# Patient Record
Sex: Male | Born: 1945 | Race: White | Hispanic: No | Smoking: Current some day smoker
Health system: Southern US, Community
[De-identification: ages and names within clinical notes are randomized; demographics above are authoritative.]

## PROBLEM LIST (undated history)

## (undated) DIAGNOSIS — H9190 Unspecified hearing loss, unspecified ear: Secondary | ICD-10-CM

## (undated) DIAGNOSIS — Z952 Presence of prosthetic heart valve: Secondary | ICD-10-CM

## (undated) DIAGNOSIS — C801 Malignant (primary) neoplasm, unspecified: Secondary | ICD-10-CM

## (undated) DIAGNOSIS — Z0000 Encounter for general adult medical examination without abnormal findings: Secondary | ICD-10-CM

## (undated) DIAGNOSIS — R3915 Urgency of urination: Secondary | ICD-10-CM

## (undated) DIAGNOSIS — H6120 Impacted cerumen, unspecified ear: Secondary | ICD-10-CM

## (undated) DIAGNOSIS — E785 Hyperlipidemia, unspecified: Secondary | ICD-10-CM

## (undated) DIAGNOSIS — T7840XA Allergy, unspecified, initial encounter: Secondary | ICD-10-CM

## (undated) DIAGNOSIS — R911 Solitary pulmonary nodule: Secondary | ICD-10-CM

## (undated) DIAGNOSIS — I350 Nonrheumatic aortic (valve) stenosis: Secondary | ICD-10-CM

## (undated) DIAGNOSIS — R159 Full incontinence of feces: Secondary | ICD-10-CM

## (undated) DIAGNOSIS — C439 Malignant melanoma of skin, unspecified: Secondary | ICD-10-CM

## (undated) DIAGNOSIS — M1990 Unspecified osteoarthritis, unspecified site: Secondary | ICD-10-CM

## (undated) DIAGNOSIS — H5461 Unqualified visual loss, right eye, normal vision left eye: Secondary | ICD-10-CM

## (undated) DIAGNOSIS — I10 Essential (primary) hypertension: Secondary | ICD-10-CM

## (undated) DIAGNOSIS — K599 Functional intestinal disorder, unspecified: Secondary | ICD-10-CM

## (undated) DIAGNOSIS — R5383 Other fatigue: Secondary | ICD-10-CM

## (undated) DIAGNOSIS — R011 Cardiac murmur, unspecified: Secondary | ICD-10-CM

## (undated) HISTORY — DX: Hyperlipidemia, unspecified: E78.5

## (undated) HISTORY — DX: Malignant melanoma of skin, unspecified: C43.9

## (undated) HISTORY — DX: Unqualified visual loss, right eye, normal vision left eye: H54.61

## (undated) HISTORY — PX: TONSILLECTOMY: SUR1361

## (undated) HISTORY — DX: Malignant (primary) neoplasm, unspecified: C80.1

## (undated) HISTORY — DX: Full incontinence of feces: R15.9

## (undated) HISTORY — DX: Essential (primary) hypertension: I10

## (undated) HISTORY — DX: Nonrheumatic aortic (valve) stenosis: I35.0

## (undated) HISTORY — DX: Urgency of urination: R39.15

## (undated) HISTORY — PX: POLYPECTOMY: SHX149

## (undated) HISTORY — DX: Encounter for general adult medical examination without abnormal findings: Z00.00

## (undated) HISTORY — PX: HERNIA REPAIR: SHX51

## (undated) HISTORY — DX: Impacted cerumen, unspecified ear: H61.20

## (undated) HISTORY — DX: Functional intestinal disorder, unspecified: K59.9

## (undated) HISTORY — DX: Other fatigue: R53.83

## (undated) HISTORY — DX: Allergy, unspecified, initial encounter: T78.40XA

## (undated) HISTORY — DX: Unspecified hearing loss, unspecified ear: H91.90

---

## 1979-11-18 HISTORY — PX: EYE SURGERY: SHX253

## 1999-08-15 ENCOUNTER — Ambulatory Visit (HOSPITAL_BASED_OUTPATIENT_CLINIC_OR_DEPARTMENT_OTHER): Admission: RE | Admit: 1999-08-15 | Discharge: 1999-08-15 | Payer: Self-pay | Admitting: General Surgery

## 2002-05-16 ENCOUNTER — Encounter: Payer: Self-pay | Admitting: General Surgery

## 2002-05-16 ENCOUNTER — Encounter: Admission: RE | Admit: 2002-05-16 | Discharge: 2002-05-16 | Payer: Self-pay | Admitting: General Surgery

## 2002-05-17 ENCOUNTER — Ambulatory Visit (HOSPITAL_BASED_OUTPATIENT_CLINIC_OR_DEPARTMENT_OTHER): Admission: RE | Admit: 2002-05-17 | Discharge: 2002-05-17 | Payer: Self-pay | Admitting: General Surgery

## 2011-10-29 ENCOUNTER — Ambulatory Visit (INDEPENDENT_AMBULATORY_CARE_PROVIDER_SITE_OTHER): Payer: Medicare Other | Admitting: Sports Medicine

## 2011-10-29 VITALS — BP 148/90 | Ht 69.0 in | Wt 170.0 lb

## 2011-10-29 DIAGNOSIS — M25569 Pain in unspecified knee: Secondary | ICD-10-CM

## 2011-10-29 DIAGNOSIS — S83289A Other tear of lateral meniscus, current injury, unspecified knee, initial encounter: Secondary | ICD-10-CM | POA: Insufficient documentation

## 2011-10-29 DIAGNOSIS — M25562 Pain in left knee: Secondary | ICD-10-CM | POA: Insufficient documentation

## 2011-10-29 MED ORDER — TRAMADOL HCL 50 MG PO TABS: 50.0000 mg | ORAL_TABLET | Freq: Four times a day (QID) | ORAL | Status: AC | PRN

## 2011-10-29 NOTE — Assessment & Plan Note (Signed)
We will try tramadol and icing for pain. He gets relief with Aleve but he also gets stomach irritation.

## 2011-10-29 NOTE — Assessment & Plan Note (Signed)
Compression sleeve to try to lessen the swelling Basic rehabilitation exercises prescribed Limit knee bending and activity until we recheck this in 6 weeks  We can consider injection if this worsens

## 2011-10-29 NOTE — Progress Notes (Signed)
  Subjective:    Patient ID: Charles Robles, male    DOB: August 16, 1946, 64 y.o.   MRN: [id number]  HPI  Pt presents to clinic for evaluation of left knee - posterior along biceps tendon. Has had the pain for over 2 months.  Pain with full flexion, going up and down stairs. He does not recall an injury, does not have swelling, locking, or giving out. Walks for exercises 4-5 times per week. 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   Also has low back pain that has been bothering him more recently. Driving causes pain. Hx of 3 falls on back in the last yr        Review of Systems     Objective:   Physical Exam  Lt knee exam: Flexion from 90 to full extension Lateral joint line is non tender to palpation Lachman's neg Mcmurray's painful with ER and has slight clicking along the lateral joint line Slight suprapatellar pouch swelling on lt Thessaly's positive on lt    Lt knee gets full extension, starts getting pain at 120 deg of flexion Rt knee gets full extension, and flexion to 145 deg with no pain  MSK ultrasound There is a moderately large suprapatellar pouch effusion extending 7 cm above the patella Quadriceps tendon and patellar tendon are normal Medial meniscus looks intact Lateral meniscus looks intact until I scanned the posterior corner There is a linear split in the posterior meniscus laterally with some hypoechoic change surrounding this The biceps femoris tendon looks normal       Assessment & Plan:

## 2011-10-29 NOTE — Patient Instructions (Addendum)
Please try to avoid deep knee bending activities  Do suggested knee exercises  Try compression sleeve for your knee  Try tramadol twice daily for your pain  Please follow up in 6 weeks for rescan of your knee   Thank you for seeing [place] today!

## 2011-11-07 ENCOUNTER — Telehealth: Payer: Self-pay | Admitting: *Deleted

## 2011-11-07 NOTE — Telephone Encounter (Signed)
Called pt left VM to return my call. 

## 2011-11-07 NOTE — Telephone Encounter (Signed)
Message copied by Mora Bellman on Fri Nov 07, 2011  2:10 PM ------      Message from: Lizbeth Bark      Created: Fri Nov 07, 2011 10:41 AM      Regarding: phone messag      Contact: [phone]       Patient would like you to call him regarding the swelling in left knee.

## 2011-12-10 ENCOUNTER — Ambulatory Visit (INDEPENDENT_AMBULATORY_CARE_PROVIDER_SITE_OTHER): Payer: Medicare Other | Admitting: Sports Medicine

## 2011-12-10 VITALS — BP 144/80

## 2011-12-10 DIAGNOSIS — S83289A Other tear of lateral meniscus, current injury, unspecified knee, initial encounter: Secondary | ICD-10-CM | POA: Diagnosis not present

## 2011-12-10 DIAGNOSIS — M25569 Pain in unspecified knee: Secondary | ICD-10-CM | POA: Diagnosis not present

## 2011-12-10 MED ORDER — MELOXICAM 15 MG PO TABS: ORAL_TABLET | ORAL | Status: DC

## 2011-12-10 NOTE — Progress Notes (Signed)
  Subjective:    Patient ID: Charles Robles, male    DOB: 03-Aug-1946, 66 y.o.   MRN: [id number]  HPI Left degenerative meniscal tear with DJD.  Better with aleve, sleeve, rehab.  No mechanical symptoms.  Swelling improved.  Happy with results, 60% better.   Review of Systems    No fevers, chills, night sweats, weight loss, chest pain, or shortness of breath.  Objective:   Physical Exam  General:  Well developed, well nourished, and in no acute distress. Neuro:  Alert and oriented x3, extra-ocular muscles intact. Skin: Warm and dry, no rashes noted. Respiratory:  Not using accessory muscles, speaking in full sentences.  Left Knee: Normal to inspection with no erythema or obvious bony abnormalities. Minimal effusion. Palpation normal with no warmth, joint line tenderness, patellar tenderness, or condyle tenderness. ROM full in flexion and extension and lower leg rotation. Ligaments with solid consistent endpoints including ACL, PCL, LCL, MCL. Negative Mcmurray's, Apley's, and Thessalonian tests. Non painful patellar compression. Patellar glide without crepitus. Patellar and quadriceps tendons unremarkable. Hamstring and quadriceps strength is normal.      Assessment & Plan:

## 2011-12-10 NOTE — Patient Instructions (Addendum)
Meloxicam. Rehab exercises. Avoid deep knee bending. Come back to see [place] in 2-3 months.

## 2011-12-10 NOTE — Assessment & Plan Note (Signed)
Improved. Minimimal mechanical symptoms. Change to meloxicam. Cont exercises. Cont sleeve. Ice.

## 2011-12-29 ENCOUNTER — Telehealth: Payer: Self-pay | Admitting: *Deleted

## 2011-12-29 NOTE — Telephone Encounter (Signed)
Pt states he has been feeling light headed, drowsy, short of breath and some very slight chest tightness for about 1 week.  States he looked at the side effects of meloxicam- and all can be caused by the medication.    Dr. Darrick Penna spoke with pt- advised him to stop meloxicam because it is causing him to retain fluid, which is a known side effect of the medication.  Recommended that he restart tramadol since he did not have any problems on this medication.  Pt agreeable.

## 2012-02-09 ENCOUNTER — Ambulatory Visit (INDEPENDENT_AMBULATORY_CARE_PROVIDER_SITE_OTHER): Payer: Medicare Other | Admitting: Sports Medicine

## 2012-02-09 VITALS — BP 120/70

## 2012-02-09 DIAGNOSIS — M25569 Pain in unspecified knee: Secondary | ICD-10-CM | POA: Diagnosis not present

## 2012-02-09 DIAGNOSIS — S83289A Other tear of lateral meniscus, current injury, unspecified knee, initial encounter: Secondary | ICD-10-CM | POA: Diagnosis not present

## 2012-02-09 DIAGNOSIS — M25519 Pain in unspecified shoulder: Secondary | ICD-10-CM

## 2012-02-09 MED ORDER — TRAMADOL HCL 50 MG PO TABS: 50.0000 mg | ORAL_TABLET | Freq: Four times a day (QID) | ORAL | Status: AC | PRN

## 2012-02-09 MED ORDER — MELOXICAM 15 MG PO TABS: 15.0000 mg | ORAL_TABLET | Freq: Every day | ORAL | Status: DC

## 2012-02-09 NOTE — Patient Instructions (Addendum)
1. You can experiment with your knee brace.  Use it for comfort but if you don't have too much discomfort you don't have to wear it.  2. You should follow up with your regular doctor to get a work up for the neurologic symptoms that we discussed today.  Take one full aspirin daily until you have discussed it with your regular doctor.  3. Do your shoulder exercises as indicated on the handout.    4. If you continue to have pain in your shoulder make an appointment for an  ultrasound it in about one month.

## 2012-02-09 NOTE — Progress Notes (Signed)
  Subjective:    Patient ID: Charles Robles, male    DOB: [birth year], 66 y.o.   MRN: [id number]  HPI 66 y/o male is here c/o right shoulder pain for 3-4 weeks.  It is not associated with any specific activity.  He does have night pain but it doesn't wake him at night.  The pain started after wood splitting.  The pain is lateral but but does not extend beyond the elbow.  He has no specific previous injuries but he has been splitting wood for a long time.  He is also here to follow up on a left degenerative meniscus.  He has minimal pain and mechanical symptoms.  He he takes 2 full dose aspirin on more days than not.  He also wears a knee brace and wonders if he will still need this.   Review of Systems     Objective:   Physical Exam  ROM is normal except he has decreased internal rotation He also has mild pain in all plains of motion Positive empty can and positive Hawkin's Positive reach over No tenderness to palpation over the [facility] joint or the biceps tendon Rotator cuff strength is preserved  The left knee has no effusion, no joint line tenderness and good ROM  Ultrasound: The subscapularis, infraspinatus, supraspinatus, and biceps tendon were visualized.  There is no sign of a tear in these tendons.      Assessment & Plan:

## 2012-02-11 ENCOUNTER — Ambulatory Visit (INDEPENDENT_AMBULATORY_CARE_PROVIDER_SITE_OTHER): Payer: Medicare Other | Admitting: Family Medicine

## 2012-02-11 ENCOUNTER — Encounter: Payer: Self-pay | Admitting: Family Medicine

## 2012-02-11 VITALS — BP 174/91 | HR 69 | Temp 98.1°F | Resp 20 | Ht 68.5 in | Wt 168.8 lb

## 2012-02-11 DIAGNOSIS — I10 Essential (primary) hypertension: Secondary | ICD-10-CM

## 2012-02-11 DIAGNOSIS — M542 Cervicalgia: Secondary | ICD-10-CM | POA: Insufficient documentation

## 2012-02-11 DIAGNOSIS — R011 Cardiac murmur, unspecified: Secondary | ICD-10-CM | POA: Diagnosis not present

## 2012-02-11 DIAGNOSIS — R42 Dizziness and giddiness: Secondary | ICD-10-CM | POA: Diagnosis not present

## 2012-02-11 LAB — CBC WITH DIFFERENTIAL/PLATELET
Basophils Absolute: 0 10*3/uL (ref 0.0–0.1)
Basophils Relative: 1 % (ref 0–1)
Eosinophils Absolute: 0.1 10*3/uL (ref 0.0–0.7)
Eosinophils Relative: 3 % (ref 0–5)
HCT: 47.5 % (ref 39.0–52.0)
Hemoglobin: 15.9 g/dL (ref 13.0–17.0)
Lymphocytes Relative: 26 % (ref 12–46)
Lymphs Abs: 1.4 10*3/uL (ref 0.7–4.0)
MCH: 31.7 pg (ref 26.0–34.0)
MCHC: 33.5 g/dL (ref 30.0–36.0)
MCV: 94.6 fL (ref 78.0–100.0)
Monocytes Absolute: 0.7 10*3/uL (ref 0.1–1.0)
Monocytes Relative: 14 % — ABNORMAL HIGH (ref 3–12)
Neutro Abs: 3 10*3/uL (ref 1.7–7.7)
Neutrophils Relative %: 57 % (ref 43–77)
Platelets: 189 10*3/uL (ref 150–400)
RBC: 5.02 MIL/uL (ref 4.22–5.81)
RDW: 13.3 % (ref 11.5–15.5)
WBC: 5.3 10*3/uL (ref 4.0–10.5)

## 2012-02-11 LAB — T4, FREE: Free T4: 1.13 ng/dL (ref 0.80–1.80)

## 2012-02-11 LAB — COMPREHENSIVE METABOLIC PANEL
ALT: 19 U/L (ref 0–53)
AST: 18 U/L (ref 0–37)
Albumin: 4.2 g/dL (ref 3.5–5.2)
Alkaline Phosphatase: 60 U/L (ref 39–117)
BUN: 15 mg/dL (ref 6–23)
CO2: 26 mEq/L (ref 19–32)
Calcium: 9.5 mg/dL (ref 8.4–10.5)
Chloride: 106 mEq/L (ref 96–112)
Creat: 0.99 mg/dL (ref 0.50–1.35)
Glucose, Bld: 101 mg/dL — ABNORMAL HIGH (ref 70–99)
Potassium: 4.4 mEq/L (ref 3.5–5.3)
Sodium: 138 mEq/L (ref 135–145)
Total Bilirubin: 0.5 mg/dL (ref 0.3–1.2)
Total Protein: 6.9 g/dL (ref 6.0–8.3)

## 2012-02-11 LAB — LIPID PANEL
Cholesterol: 282 mg/dL — ABNORMAL HIGH (ref 0–200)
HDL: 76 mg/dL (ref >39)
Total CHOL/HDL Ratio: 3.7 Ratio
Triglycerides: 214 mg/dL — ABNORMAL HIGH (ref <150)
VLDL: 43 mg/dL — ABNORMAL HIGH (ref 0–40)

## 2012-02-11 LAB — POCT URINALYSIS DIPSTICK
Bilirubin, UA: NEGATIVE
Blood, UA: NEGATIVE
Glucose, UA: NEGATIVE
Ketones, UA: NEGATIVE
Leukocytes, UA: NEGATIVE
Nitrite, UA: NEGATIVE
Protein, UA: NEGATIVE
Spec Grav, UA: 1.025
Urobilinogen, UA: 0.2
pH, UA: 5

## 2012-02-11 LAB — TSH: TSH: 1.019 u[IU]/mL (ref 0.350–4.500)

## 2012-02-11 NOTE — Patient Instructions (Signed)
You will be scheduled for ECHO of your heart and a CT scan of your brain to look for evidence of a stroke or TIA.

## 2012-02-11 NOTE — Assessment & Plan Note (Signed)
Because his symptoms are mild we will start with strengthening exercises.  He will try these at home for a month and follow up with [place] if he continues to have pain.  He is currently taking aspirin daily for cardioprotection so no medication will need to be added.

## 2012-02-11 NOTE — Progress Notes (Signed)
Subjective:    Patient ID: Charles Robles, male    DOB: 06-03-1946, 66 y.o.   MRN: [id number]  HPI  This 66 y.o Cauc male is here today for evaluation of an episode of dizziness with generalized  headache, nausea and vomiting last Thursday (02/05/12). He had 2 emesis episodes with weakness  that lasted for ~ 48 hours; by Sunday (02/08/12) he felt much better; he went out on Saturday to purchase a vehicle.  The only residual symptom is some dizziness without weakness or headache. He has HTN which was treated in   the past with Lisinopril- HCTZ but he discontinued the med several months ago;he says BP checks have been  near normal range. He denies any CP, syncope, palpitations, numbness or abnormal gait or problems with  coordination. He was seen by Dr. Roanna Epley at [facility] on 02/09/12 and advised to have these symptoms evaluated.        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 He could not give me any details  about the cause of the murmur.    Review of Systems  Eyes: Negative.   Respiratory: Negative.   Cardiovascular: Negative.   Gastrointestinal: Positive for nausea and vomiting. Negative for abdominal pain, diarrhea and constipation.  Genitourinary: Negative.   Neurological: Positive for dizziness, weakness and headaches. Negative for seizures, facial asymmetry, speech difficulty and numbness.  Psychiatric/Behavioral: Negative.   All other systems reviewed and are negative.       Objective:   Physical Exam  Vitals reviewed. Constitutional: He is oriented to person, place, and time. He appears well-developed and well-nourished.  HENT:  Head: Normocephalic and atraumatic.  Right Ear: External ear normal.  Left Ear: External ear normal.  Nose: Nose normal.  Mouth/Throat: Oropharynx is clear and moist.  Eyes:  Conjunctivae and EOM are normal. Pupils are equal, round, and reactive to light. No scleral icterus.       Bilateral cataracts- fundi difficult to assess  Neck: Normal range of motion. Neck supple. No thyromegaly present.  Cardiovascular: Normal rate, regular rhythm, S1 normal and S2 normal.  Exam reveals no gallop.   Murmur heard.  Crescendo systolic murmur is present with a grade of 4/6       Loud blowing murmur heard best at LLSB  Pulmonary/Chest: Effort normal and breath sounds normal. No respiratory distress. He has no rales.  Abdominal: Bowel sounds are normal. He exhibits no distension and no mass. There is no tenderness. There is no guarding.  Lymphadenopathy:    He has no cervical adenopathy.  Neurological: He is alert and oriented to person, place, and time. No cranial nerve deficit. Coordination normal.  Skin: Skin is warm and dry.  Psychiatric: He has a normal mood and affect. His behavior is normal. Judgment and thought content normal.    ECG:  SInus rhythm with nonspecific ST junctional depression (less than 1 mm)- no significant difference from            ECG done 06/09/11)      Assessment & Plan:   1. Dizziness   CBC with Differential, CT Head Wo Contrast (pt seems hesitant to have imaging study b/o cost; he is advised to inquire about out-of-pocket cost at the time he is contacted with appointment for CT)  2. HTN (hypertension)  EKG 12-Lead, Comprehensive metabolic panel, T4, Free, TSH, Lipid panel; pt wants to wait on results of test before resuming BP medication  3. Undiagnosed cardiac murmurs  2D Echocardiogram without contrast

## 2012-02-11 NOTE — Assessment & Plan Note (Signed)
His pain has improved to the point that is no longer interferes with his life.  He can continue to use his brace PRN but probably doesn't need to wear it all of the time.  Follow up for his knee is PRN

## 2012-02-12 ENCOUNTER — Encounter: Payer: Self-pay | Admitting: Family Medicine

## 2012-02-12 DIAGNOSIS — I10 Essential (primary) hypertension: Secondary | ICD-10-CM | POA: Insufficient documentation

## 2012-02-16 ENCOUNTER — Ambulatory Visit
Admission: RE | Admit: 2012-02-16 | Discharge: 2012-02-16 | Disposition: A | Discharge status: Home / Self Care | Payer: Medicare Other | Source: Ambulatory Visit | Attending: Family Medicine | Admitting: Family Medicine

## 2012-02-16 DIAGNOSIS — R51 Headache: Secondary | ICD-10-CM | POA: Diagnosis not present

## 2012-02-16 DIAGNOSIS — R42 Dizziness and giddiness: Secondary | ICD-10-CM

## 2012-02-16 DIAGNOSIS — R112 Nausea with vomiting, unspecified: Secondary | ICD-10-CM | POA: Diagnosis not present

## 2012-02-18 NOTE — Progress Notes (Signed)
Quick Note:  Please call pt and advise that the following labs are abnormal...  Lipid profile shows total cholesterol and triglycerides are high. Diet changes are very important and can be effective. I would like for pt to try a low fat, low cholesterol diet; information is readily available on the Internet. I would like to recheck his numbers again in about 3 months.  Provide pt with copy of labs. ______

## 2012-02-18 NOTE — Progress Notes (Signed)
Quick Note:  Your test is abnormal. You need to schedule f/u office visit to discuss.  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 I would like to see the pt again within the next 4-6 weeks. Please advise him to schedule a follow-up appt. ______

## 2012-02-19 ENCOUNTER — Telehealth: Payer: Self-pay

## 2012-02-19 NOTE — Telephone Encounter (Signed)
lmom to cb. 

## 2012-02-19 NOTE — Telephone Encounter (Signed)
LMOM to CB. 

## 2012-02-19 NOTE — Telephone Encounter (Signed)
PT STATES HE HAD LABS DONE AND IS STILL WAITING TO HEAR RESULTS PLEASE CALL [phone]   HE WOULD LIKE TO Marvis Moeller

## 2012-02-19 NOTE — Telephone Encounter (Signed)
DR [facility]   PT STATES HE IS STILL HAVING HEADACHES AND REQUESTS A CALL BACK FROM YOU TO SPEAK WITH YOU ABOUT THIS.   HE HAS SCHEDULED A F/U APPT FOR MAY 2013   [facility]

## 2012-02-20 NOTE — Telephone Encounter (Signed)
Spoke with pt about his HA Sxs. Pt stated he conts to have slight/mild HAs and dizziness that he thinks are becoming even milder. He thinks it may be r/t his vision, cataracts and not wearing his glasses as often as he should. He plans to get into see his eye doctor soon. He states he doesn't really have any other ?s about this now, but will CB or RTC if worsens again. D/W pt his CT Head results and white matter/cholesterol relationship and how high chol may affect white matter and pt ?'d whether getting his chol under control might prevent or even improve the progression of white matter. Pt wishes to discuss this at his appt w/Dr Audria Nine in May. Also D/W pt ways to improve chol. Dr Audria Nine, I'm forwarding this to you FYI and in case you think pt needs to f/up on HAs/dizziness other than w/ his eye doctor.

## 2012-02-20 NOTE — Telephone Encounter (Signed)
LMOM to CB. Get details of Sxs/?s for Dr Audria Nine

## 2012-02-24 ENCOUNTER — Other Ambulatory Visit: Payer: Self-pay | Admitting: *Deleted

## 2012-02-24 ENCOUNTER — Ambulatory Visit (HOSPITAL_COMMUNITY): Payer: Medicare Other | Attending: Cardiovascular Disease

## 2012-02-24 ENCOUNTER — Other Ambulatory Visit: Payer: Self-pay

## 2012-02-24 DIAGNOSIS — R55 Syncope and collapse: Secondary | ICD-10-CM | POA: Insufficient documentation

## 2012-02-24 DIAGNOSIS — I10 Essential (primary) hypertension: Secondary | ICD-10-CM | POA: Insufficient documentation

## 2012-02-24 DIAGNOSIS — R011 Cardiac murmur, unspecified: Secondary | ICD-10-CM | POA: Diagnosis not present

## 2012-02-24 MED ORDER — TRAMADOL HCL 50 MG PO TABS: 50.0000 mg | ORAL_TABLET | Freq: Three times a day (TID) | ORAL | Status: DC | PRN

## 2012-02-24 NOTE — Telephone Encounter (Signed)
I agree with advise given to pt and will discuss lipids, etc. at visit in May. A complete evaluation by his eye care specialist is appropriate at this time.

## 2012-03-18 ENCOUNTER — Encounter: Payer: Self-pay | Admitting: Family Medicine

## 2012-03-18 ENCOUNTER — Ambulatory Visit (INDEPENDENT_AMBULATORY_CARE_PROVIDER_SITE_OTHER): Payer: Medicare Other | Admitting: Family Medicine

## 2012-03-18 VITALS — BP 179/89 | HR 81 | Temp 98.9°F | Resp 18 | Ht 68.0 in | Wt 173.4 lb

## 2012-03-18 DIAGNOSIS — I10 Essential (primary) hypertension: Secondary | ICD-10-CM

## 2012-03-18 DIAGNOSIS — M25559 Pain in unspecified hip: Secondary | ICD-10-CM

## 2012-03-18 DIAGNOSIS — E785 Hyperlipidemia, unspecified: Secondary | ICD-10-CM | POA: Diagnosis not present

## 2012-03-18 DIAGNOSIS — M25551 Pain in right hip: Secondary | ICD-10-CM

## 2012-03-18 NOTE — Patient Instructions (Addendum)

## 2012-03-18 NOTE — Progress Notes (Signed)
  Subjective:    Patient ID: Charles Robles, male    DOB: 11/16/1946, 66 y.o.   MRN: [id number]  HPI This 66 y.o. Cauc male returns for follow-up of HTN and lipid disorder. He has opted to work on lifestyle changes  as opposed to prescription medications. His complaint today is right hip pain (he suspects DJD); takes an  occasional Aleve. Not particularly bothered with nocturnal pain. He gives a hx of several falls in the past  which have contributed to joint pain.        Re: HTN- pt plans to start heart-healthy diet (starting with oatmeal daily); to change activity level and continue  Fish Oil supplement. This is also part of the plan to reduce Lipids. He does not want to take any prescription  medications. He is taking an aspirin daily.   Review of Systems As per HPI     Objective:   Physical Exam  Nursing note and vitals reviewed. Constitutional: He is oriented to person, place, and time. He appears well-developed and well-nourished. No distress.  HENT:  Head: Normocephalic and atraumatic.  Eyes: No scleral icterus.  Cardiovascular: Normal rate.   Pulmonary/Chest: No respiratory distress.  Musculoskeletal:       Gait mildly antalgic  Neurological: He is alert and oriented to person, place, and time. No cranial nerve deficit.  Psychiatric: He has a normal mood and affect. His behavior is normal. Thought content normal.    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 HTN (hypertension)    Lifestyle changes to be aggressively undertaken; pt given info about Anti-Inflammatory diet and other changes to help lower BP  2. Hyperlipidemia               Pt states he was not fasting at time  of previous labs- recheck in 3 months  3. Right hip pain                Pt needs no further evaluation at this time; if pain increases in severity, will pursue further work-up. Will use conservative measures to reduce pain

## 2012-03-23 DIAGNOSIS — H18609 Keratoconus, unspecified, unspecified eye: Secondary | ICD-10-CM | POA: Diagnosis not present

## 2012-03-23 DIAGNOSIS — H52209 Unspecified astigmatism, unspecified eye: Secondary | ICD-10-CM | POA: Diagnosis not present

## 2012-03-23 DIAGNOSIS — H04129 Dry eye syndrome of unspecified lacrimal gland: Secondary | ICD-10-CM | POA: Diagnosis not present

## 2012-04-02 DIAGNOSIS — H04129 Dry eye syndrome of unspecified lacrimal gland: Secondary | ICD-10-CM | POA: Diagnosis not present

## 2012-06-07 DIAGNOSIS — H11829 Conjunctivochalasis, unspecified eye: Secondary | ICD-10-CM | POA: Diagnosis not present

## 2013-02-03 ENCOUNTER — Encounter: Payer: Self-pay | Admitting: Sports Medicine

## 2013-02-03 ENCOUNTER — Ambulatory Visit (INDEPENDENT_AMBULATORY_CARE_PROVIDER_SITE_OTHER): Payer: Medicare Other | Admitting: Sports Medicine

## 2013-02-03 VITALS — BP 150/97 | HR 73 | Ht 68.0 in | Wt 173.0 lb

## 2013-02-03 DIAGNOSIS — M25559 Pain in unspecified hip: Secondary | ICD-10-CM | POA: Diagnosis not present

## 2013-02-03 DIAGNOSIS — M25551 Pain in right hip: Secondary | ICD-10-CM | POA: Insufficient documentation

## 2013-02-03 NOTE — Assessment & Plan Note (Signed)
Use Aleve prn  HEP  Watch for acitivities that cause increase in pain  We should reck in 2 mos

## 2013-02-03 NOTE — Progress Notes (Signed)
Patient ID: Charles Robles, male   DOB: [birth year], 67 y.o.   MRN: [id number]  Patient comes in with RT hip pain laterally Timing - some pain over past year and had some massage Tx Did not go away with that Radiation to upper lateral thigh Has had some recent flares More pain while sleeping Exercise seems to help some  Does a lot of trail walking /  Some sloped  Adult nurse firewood  Examinatnion NAD  Hip flexion 140 deg bilat 50 deg abduction/ normal adduction Faber normal but causes some pain IR 15 and ER 50 while lying  TTP over glut medius and greater troch  Weakness on RT hip abduction - other testing normal

## 2013-02-03 NOTE — Patient Instructions (Addendum)
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

## 2013-03-21 DIAGNOSIS — Z947 Corneal transplant status: Secondary | ICD-10-CM | POA: Diagnosis not present

## 2013-03-21 DIAGNOSIS — H18609 Keratoconus, unspecified, unspecified eye: Secondary | ICD-10-CM | POA: Diagnosis not present

## 2013-04-07 ENCOUNTER — Encounter: Payer: Self-pay | Admitting: Sports Medicine

## 2013-04-07 ENCOUNTER — Ambulatory Visit (HOSPITAL_BASED_OUTPATIENT_CLINIC_OR_DEPARTMENT_OTHER): Payer: Medicare Other | Admitting: Sports Medicine

## 2013-04-07 VITALS — BP 152/90 | HR 72 | Ht 68.0 in | Wt 178.0 lb

## 2013-04-07 DIAGNOSIS — M25551 Pain in right hip: Secondary | ICD-10-CM

## 2013-04-07 DIAGNOSIS — M25559 Pain in unspecified hip: Secondary | ICD-10-CM | POA: Diagnosis not present

## 2013-04-07 NOTE — Assessment & Plan Note (Signed)
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

## 2013-04-07 NOTE — Progress Notes (Signed)
Charles Robles is a 67 y.o. male who presents to [facility] today for followup right lateral hip pain. Patient was seen back in March for lateral hip pain thought to be gluteus medius tendinitis versus greater trochanteric bursitis. He was given hip stretching and hip abduction strengthening exercises. He notes his pain is approximately 60% improved. He continues his normal activities of daily living which for him include hiking chopping wood and paddling.  He feels well with no radiating pain weakness or numbness.    PMH reviewed.  History  Substance Use Topics  . Smoking status: Current Some Day Smoker    Types: Cigars  . Smokeless tobacco: Never Used  . Alcohol Use: Not on file   ROS as above otherwise neg   Exam:  BP 152/90  Pulse 72  Ht 5\' 8"  (1.727 m)  Wt 178 lb (80.74 kg)  BMI 27.07 kg/m2 Gen: Well NAD MSK: Right hip. Mildly tender over the right greater trochanter Range of motion decreased to external rotation Hip abduction strength 4/5.  External hip rotation of strength 4+/5 Sensation and capillary refill are intact distally

## 2013-04-07 NOTE — Patient Instructions (Addendum)
Thank you for coming in today. I am glad that you are improving.  Please continue the exercises.  If it gets bad enough you can consider an injection or formal physical therapy, although I do not anticipate that you will need it.  Please return as needed.

## 2014-02-15 ENCOUNTER — Ambulatory Visit (INDEPENDENT_AMBULATORY_CARE_PROVIDER_SITE_OTHER): Payer: Medicare Other | Admitting: Sports Medicine

## 2014-02-15 ENCOUNTER — Encounter: Payer: Self-pay | Admitting: Sports Medicine

## 2014-02-15 VITALS — BP 169/90 | Ht 69.0 in | Wt 175.0 lb

## 2014-02-15 DIAGNOSIS — M25519 Pain in unspecified shoulder: Secondary | ICD-10-CM

## 2014-02-15 DIAGNOSIS — M25559 Pain in unspecified hip: Secondary | ICD-10-CM

## 2014-02-15 DIAGNOSIS — S83289A Other tear of lateral meniscus, current injury, unspecified knee, initial encounter: Secondary | ICD-10-CM | POA: Diagnosis not present

## 2014-02-15 DIAGNOSIS — M4722 Other spondylosis with radiculopathy, cervical region: Secondary | ICD-10-CM | POA: Insufficient documentation

## 2014-02-15 DIAGNOSIS — M4712 Other spondylosis with myelopathy, cervical region: Secondary | ICD-10-CM

## 2014-02-15 DIAGNOSIS — M25579 Pain in unspecified ankle and joints of unspecified foot: Secondary | ICD-10-CM | POA: Insufficient documentation

## 2014-02-15 DIAGNOSIS — M25551 Pain in right hip: Secondary | ICD-10-CM

## 2014-02-15 DIAGNOSIS — M25569 Pain in unspecified knee: Secondary | ICD-10-CM | POA: Diagnosis not present

## 2014-02-15 NOTE — Assessment & Plan Note (Signed)
Suggest trying some simple arch inserts first  If unresolved we will do [place] and use other meds  Reck prn

## 2014-02-15 NOTE — Assessment & Plan Note (Signed)
While he has had RC sxs in past now appears that pain radiates from neck

## 2014-02-15 NOTE — Progress Notes (Signed)
Patient ID: Charles Robles, male   DOB: 04-22-1946, 68 y.o.   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

## 2014-02-15 NOTE — Assessment & Plan Note (Signed)
This seems to have recurred He has lost strength gained in past  Return to HEP with emphasis on abduction

## 2014-02-15 NOTE — Assessment & Plan Note (Signed)
Work on neck ROM Watch vulnerable positions

## 2014-03-28 DIAGNOSIS — H2510 Age-related nuclear cataract, unspecified eye: Secondary | ICD-10-CM | POA: Diagnosis not present

## 2014-03-28 DIAGNOSIS — H18609 Keratoconus, unspecified, unspecified eye: Secondary | ICD-10-CM | POA: Diagnosis not present

## 2014-03-28 DIAGNOSIS — H52209 Unspecified astigmatism, unspecified eye: Secondary | ICD-10-CM | POA: Diagnosis not present

## 2014-04-13 ENCOUNTER — Encounter: Payer: Self-pay | Admitting: Family Medicine

## 2014-04-13 ENCOUNTER — Ambulatory Visit: Payer: Medicare Other

## 2014-04-13 ENCOUNTER — Ambulatory Visit (INDEPENDENT_AMBULATORY_CARE_PROVIDER_SITE_OTHER): Payer: Medicare Other | Admitting: Family Medicine

## 2014-04-13 VITALS — BP 167/83 | HR 74 | Temp 98.6°F | Resp 18 | Ht 69.0 in | Wt 174.0 lb

## 2014-04-13 DIAGNOSIS — R011 Cardiac murmur, unspecified: Secondary | ICD-10-CM | POA: Diagnosis not present

## 2014-04-13 DIAGNOSIS — H6690 Otitis media, unspecified, unspecified ear: Secondary | ICD-10-CM

## 2014-04-13 DIAGNOSIS — R079 Chest pain, unspecified: Secondary | ICD-10-CM | POA: Diagnosis not present

## 2014-04-13 DIAGNOSIS — H6120 Impacted cerumen, unspecified ear: Secondary | ICD-10-CM

## 2014-04-13 DIAGNOSIS — J329 Chronic sinusitis, unspecified: Secondary | ICD-10-CM

## 2014-04-13 MED ORDER — AMOXICILLIN 875 MG PO TABS: 875.0000 mg | ORAL_TABLET | Freq: Two times a day (BID) | ORAL | Status: DC

## 2014-04-13 NOTE — Patient Instructions (Signed)
Aortic Stenosis Aortic stenosis is a narrowing of the aortic valve. The aortic valve is a gatelike structure that is located between the lower left chamber of the heart (left ventricle) and the blood vessel that leads away from the heart (aorta). When the aortic valve is narrowed, it does not open all the way. This makes it hard for the heart to pump blood into the aorta and causes the heart to work harder. The extra work can weaken the heart over time and lead to heart failure. CAUSES  Causes of aortic stenosis include:  Calcium deposits on the aortic valve that have made the valve stiff. This condition generally affects those over the age of 96. It is the most common cause of aortic stenosis.  A birth defect.  Rheumatic fever. This is a problem that may occur after a strep throat infection that was not treated adequately. Rheumatic fever can cause permanent damage to heart valves. SYMPTOMS  People with aortic stenosis usually have no symptoms until the condition becomes severe. It may take 10 20 years for mild or moderate aortic stenosis to become severe. Symptoms may include:   Shortness of breath, especially with physical activity.   Feeling weak and tired (fatigued) or getting tired easily.  Chest discomfort (angina). This may occur with minimal activity if the aortic stenosis is severe.  An irregular or faster-than-normal heartbeat.  Dizziness or fainting that happens with exertion or after taking certain heart medicines (such as nitroglycerin). DIAGNOSIS  Aortic stenosis is usually diagnosed with a physical exam and with a type of imaging test called echocardiography. During echocardiography, sound waves are used to evaluate how blood flows through the heart. If your caregiver suspects aortic stenosis but the test does not clearly show it, a procedure called cardiac catheterization may be done to diagnose the condition. Tests may also be done to evaluate heart function. They may  include:  Electrocardiography. During this test, the electrical impulses of the heart are recorded while you are lying down and sticky patches are placed on your chest, arms, and legs.  Stress tests. There is more than one type of stress test. If a stress test is needed, ask your caregiver about which type is best for you.  Blood tests. TREATMENT  Treatment depends on how severe the aortic stenosis is, your symptoms, and the problems it is causing.   Observation. If the aortic stenosis is mild, no treatment may be needed. However, you will need to have the condition checked regularly to make sure it is not getting worse or causing serious problems.  Surgery. Surgery to repair or replace the aortic valve is the most common treatment for aortic stenosis. Several types of surgeries are available. The most common are open-heart surgery and transcutaneous aortic valve replacement (TAVR). TAVR does not require that the chest be opened. It is usually performed on elderly patients and those who are not able to have open-heart surgery.  Medicines. Medicines may be given to keep symptoms from getting worse. Medicines cannot reverse aortic stenosis. HOME CARE INSTRUCTIONS   You may need to avoid certain types of physical activity. If your aortic stenosis is mild, you may need to avoid only strenuous activity. The more severe your aortic stenosis, the more activities you will need to avoid. Talk with your caregiver about the types of activity you should avoid.  Take all medicines as directed by your caregiver.  If you are a woman with aortic valve stenosis and want to get pregnant, talk  to your caregiver before you become pregnant.  If you are a woman with aortic valve stenosis and are pregnant, keep all follow-up appointments with all recommended caregivers.  Keep all follow-up appointments for tests, exams, and treatments. SEEK IMMEDIATE MEDICAL CARE IF:  You develop chest pain or tightness.    You develop shortness of breath or difficulty breathing.   You develop lightheadedness or faint.   It feels like your heartbeat is irregular or faster than normal.  You have a fever or persistent symptoms for more than 2 3 days. Document Released: 08/02/2003 Document Revised: 02/28/2013 Document Reviewed: 10/28/2012 [facility] Patient Information 2014 [place], [place]. Sinusitis Sinusitis is redness, soreness, and swelling (inflammation) of the paranasal sinuses. Paranasal sinuses are air pockets within the bones of your face (beneath the eyes, the middle of the forehead, or above the eyes). In healthy paranasal sinuses, mucus is able to drain out, and air is able to circulate through them by way of your nose. However, when your paranasal sinuses are inflamed, mucus and air can become trapped. This can allow bacteria and other germs to grow and cause infection. Sinusitis can develop quickly and last only a short time (acute) or continue over a long period (chronic). Sinusitis that lasts for more than 12 weeks is considered chronic.  CAUSES  Causes of sinusitis include:  Allergies.  Structural abnormalities, such as displacement of the cartilage that separates your nostrils (deviated septum), which can decrease the air flow through your nose and sinuses and affect sinus drainage.  Functional abnormalities, such as when the small hairs (cilia) that line your sinuses and help remove mucus do not work properly or are not present. SYMPTOMS  Symptoms of acute and chronic sinusitis are the same. The primary symptoms are pain and pressure around the affected sinuses. Other symptoms include:  Upper toothache.  Earache.  Headache.  Bad breath.  Decreased sense of smell and taste.  A cough, which worsens when you are lying flat.  Fatigue.  Fever.  Thick drainage from your nose, which often is green and may contain pus (purulent).  Swelling and warmth over the affected  sinuses. DIAGNOSIS  Your caregiver will perform a physical exam. During the exam, your caregiver may:  Look in your nose for signs of abnormal growths in your nostrils (nasal polyps).  Tap over the affected sinus to check for signs of infection.  View the inside of your sinuses (endoscopy) with a special imaging device with a light attached (endoscope), which is inserted into your sinuses. If your caregiver suspects that you have chronic sinusitis, one or more of the following tests may be recommended:  Allergy tests.  Nasal culture A sample of mucus is taken from your nose and sent to a lab and screened for bacteria.  Nasal cytology A sample of mucus is taken from your nose and examined by your caregiver to determine if your sinusitis is related to an allergy. TREATMENT  Most cases of acute sinusitis are related to a viral infection and will resolve on their own within 10 days. Sometimes medicines are prescribed to help relieve symptoms (pain medicine, decongestants, nasal steroid sprays, or saline sprays).  However, for sinusitis related to a bacterial infection, your caregiver will prescribe antibiotic medicines. These are medicines that will help kill the bacteria causing the infection.  Rarely, sinusitis is caused by a fungal infection. In theses cases, your caregiver will prescribe antifungal medicine. For some cases of chronic sinusitis, surgery is needed. Generally, these are cases  in which sinusitis recurs more than 3 times per year, despite other treatments. HOME CARE INSTRUCTIONS   Drink plenty of water. Water helps thin the mucus so your sinuses can drain more easily.  Use a humidifier.  Inhale steam 3 to 4 times a day (for example, sit in the bathroom with the shower running).  Apply a warm, moist washcloth to your face 3 to 4 times a day, or as directed by your caregiver.  Use saline nasal sprays to help moisten and clean your sinuses.  Take over-the-counter or  prescription medicines for pain, discomfort, or fever only as directed by your caregiver. SEEK IMMEDIATE MEDICAL CARE IF:  You have increasing pain or severe headaches.  You have nausea, vomiting, or drowsiness.  You have swelling around your face.  You have vision problems.  You have a stiff neck.  You have difficulty breathing. MAKE SURE YOU:   Understand these instructions.  Will watch your condition.  Will get help right away if you are not doing well or get worse. Document Released: 11/03/2005 Document Revised: 01/26/2012 Document Reviewed: 11/18/2011 [facility] Patient Information 2014 [place], [place].

## 2014-04-13 NOTE — Progress Notes (Addendum)
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  Patient complains about not being able to clear his years.  The chest pain is unrelated to any particular activity either with respect to onset or during the day. He's rather steady and is characterized by tightness over his precordium. He has no shortness of breath or diaphoresis. The pain is not affected by lying.   Objective:  NAD  HEENT:  Bilateral cerumen impaction, normal nasal passages, normal oropharynx;  The cerumen was irrigated clear. Left TM is thickened, and retracted with loss of normal landmarks, and quite red anteriorly. Neck: Bilateral carotid bruits, no thyromegaly, no cervical adenopathy Chest: Clear to auscultation Heart: Loud 3/6 systolic murmur best heard at the left apex but also heard at the races O. border. Abdomen: Soft nontender without HSM, masses or bruits Extremities, good pedal pulses, no edema Skin: Actinic keratoses scattered over his back, scalp (right parietal area)  EKG: Borderline LVH [facility] reading (PRIMARY) by  Dr. Joseph Art.  CXR:  normal  [facility] reading (PRIMARY) by  Dr. Joseph Art  Assessment: The patient has aortic stenosis with left ventricular hypertrophy. I am suspicious that this is causing the chest pain.  Cerumen impactions been cleared but I suspect patient also has sinusitis with his otitis media..  Chest pain - Plan: EKG 12-Lead, CBC with Differential, COMPLETE METABOLIC PANEL WITH GFR, Ambulatory referral to Cardiology, DG Chest 2 View  Heart murmur - Plan: EKG 12-Lead, Ambulatory referral to Cardiology, DG Chest 2 View  Cerumen impaction  Otitis media - Plan: amoxicillin (AMOXIL) 875 MG tablet  Sinusitis - Plan: amoxicillin (AMOXIL) 875 MG tablet  Signed, Robyn Haber, MD

## 2014-04-14 ENCOUNTER — Telehealth: Payer: Self-pay

## 2014-04-14 ENCOUNTER — Ambulatory Visit (INDEPENDENT_AMBULATORY_CARE_PROVIDER_SITE_OTHER): Payer: Medicare Other | Admitting: Family Medicine

## 2014-04-14 DIAGNOSIS — R079 Chest pain, unspecified: Secondary | ICD-10-CM

## 2014-04-14 DIAGNOSIS — R011 Cardiac murmur, unspecified: Secondary | ICD-10-CM

## 2014-04-14 LAB — CBC WITH DIFFERENTIAL/PLATELET
Basophils Absolute: 0 10*3/uL (ref 0.0–0.1)
Basophils Relative: 0 % (ref 0–1)
Eosinophils Absolute: 0.2 10*3/uL (ref 0.0–0.7)
Eosinophils Relative: 2 % (ref 0–5)
HCT: 44 % (ref 39.0–52.0)
Hemoglobin: 15 g/dL (ref 13.0–17.0)
Lymphocytes Relative: 42 % (ref 12–46)
Lymphs Abs: 3.3 10*3/uL (ref 0.7–4.0)
MCH: 30.8 pg (ref 26.0–34.0)
MCHC: 34.1 g/dL (ref 30.0–36.0)
MCV: 90.3 fL (ref 78.0–100.0)
Monocytes Absolute: 0.6 10*3/uL (ref 0.1–1.0)
Monocytes Relative: 8 % (ref 3–12)
Neutro Abs: 3.8 10*3/uL (ref 1.7–7.7)
Neutrophils Relative %: 48 % (ref 43–77)
Platelets: 166 10*3/uL (ref 150–400)
RBC: 4.87 MIL/uL (ref 4.22–5.81)
RDW: 14.1 % (ref 11.5–15.5)
WBC: 7.9 10*3/uL (ref 4.0–10.5)

## 2014-04-14 NOTE — Telephone Encounter (Signed)
Pt states that on his AVS it states he had a CBC and CMP completed but pt states he did not have blood work drawn. Pt is wondering if he needs to rtc to have this blood work done or if we should cancel the order. Please advise.

## 2014-04-14 NOTE — Telephone Encounter (Signed)
Saw Dr.L yesterday, and was supposed to receive blood work that he did not get, he said that Dr.L is supposed to call him today about a referral, and would like this to be mentioned; [phone]

## 2014-04-15 LAB — COMPLETE METABOLIC PANEL WITH GFR
ALT: 15 U/L (ref 0–53)
AST: 16 U/L (ref 0–37)
Albumin: 3.9 g/dL (ref 3.5–5.2)
Alkaline Phosphatase: 59 U/L (ref 39–117)
BUN: 14 mg/dL (ref 6–23)
CO2: 22 mEq/L (ref 19–32)
Calcium: 9.3 mg/dL (ref 8.4–10.5)
Chloride: 105 mEq/L (ref 96–112)
Creat: 1 mg/dL (ref 0.50–1.35)
GFR, Est African American: 89 mL/min
GFR, Est Non African American: 77 mL/min
Glucose, Bld: 106 mg/dL — ABNORMAL HIGH (ref 70–99)
Potassium: 4.4 mEq/L (ref 3.5–5.3)
Sodium: 139 mEq/L (ref 135–145)
Total Bilirubin: 0.5 mg/dL (ref 0.2–1.2)
Total Protein: 6.9 g/dL (ref 6.0–8.3)

## 2014-04-21 NOTE — ED Provider Notes (Signed)
Order(s) created erroneously. Erroneous order ID: [id number] Order moved by: Milas Hock Order move date/time: 04/21/2014  2:00 PM Source Patient:    [id number] Source Contact: 04/13/2014 Destination Patient:   [id number] Destination Contact: 02/01/2013 Erroneous order ID: [id number] Order moved by: Milas Hock Order move date/time: 04/21/2014  2:00 PM Source Patient:    [id number] Source Contact: 04/13/2014 Destination Patient:   [id number] Destination Contact: 02/01/2013

## 2014-05-23 ENCOUNTER — Encounter: Payer: Self-pay | Admitting: Cardiology

## 2014-05-23 ENCOUNTER — Ambulatory Visit (INDEPENDENT_AMBULATORY_CARE_PROVIDER_SITE_OTHER): Payer: Medicare Other | Admitting: Cardiology

## 2014-05-23 ENCOUNTER — Encounter: Payer: Self-pay | Admitting: *Deleted

## 2014-05-23 VITALS — BP 142/88 | HR 70 | Ht 69.0 in | Wt 177.0 lb

## 2014-05-23 DIAGNOSIS — E782 Mixed hyperlipidemia: Secondary | ICD-10-CM

## 2014-05-23 DIAGNOSIS — I359 Nonrheumatic aortic valve disorder, unspecified: Secondary | ICD-10-CM

## 2014-05-23 DIAGNOSIS — R030 Elevated blood-pressure reading, without diagnosis of hypertension: Secondary | ICD-10-CM | POA: Diagnosis not present

## 2014-05-23 DIAGNOSIS — R011 Cardiac murmur, unspecified: Secondary | ICD-10-CM

## 2014-05-23 DIAGNOSIS — R0789 Other chest pain: Secondary | ICD-10-CM | POA: Diagnosis not present

## 2014-05-23 DIAGNOSIS — I350 Nonrheumatic aortic (valve) stenosis: Secondary | ICD-10-CM | POA: Insufficient documentation

## 2014-05-23 NOTE — Progress Notes (Signed)
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 He was previously seen in 04/14/14 by Dr. Joseph Art for what he says was generalized malaise, feeling poorly overall. As an after thought, he told me about the chest discomfort that he was having, centrally, tightness with no other associated symptoms. He had earwax removed, amoxicillin for sinus infection and shortly after this, he felt better. He has not had any further chest discomfort. His doctor appreciated a significant murmur and wanted to see a cardiologist for this.   He has been quite active. He has a wood furnace at home and splits his own wood. Just last Saturday he was splitting wood for approximately 8 hours, hydraulic. Significantly active. Did not experience any chest discomfort with this activity.   Aortic stenosis was described as mild in 2013, echocardiogram reviewed.   In regards to hypertension, he was started a few years ago on lisinopril but has subsequently taken himself off of this medication.   Wt Readings from Last 3 Encounters:  05/23/14 177 lb (80.287 kg)  04/13/14 174 lb (78.926 kg)  02/15/14 175 lb (79.379 kg)     No past medical history on file.  Past Surgical History  Procedure Laterality Date  . Eye surgery  1981     Right eye- corneal transplant    Current Outpatient Prescriptions  Medication Sig Dispense Refill  . aspirin 325 MG tablet Take 325 mg by mouth daily.      . cetirizine (ZYRTEC) 10 MG tablet Take 10 mg by mouth daily.      . fish oil-omega-3 fatty acids 1000 MG capsule Take 2 g by mouth daily.      . naproxen (NAPROSYN) 500 MG tablet Take 500 mg by mouth 2 (two) times daily  with a meal.       No current facility-administered medications for this visit.    Allergies:   No Known Allergies  Social History:  The patient  reports that he has been smoking Cigars.  He has never used smokeless tobacco.   Family History  Problem Relation Age of Onset  . Heart disease Father     ROS:  Please see the history of present illness.   Denies any orthopnea, PND, syncope, rash, strokelike symptoms, fevers.  All other systems reviewed and negative.   PHYSICAL EXAM: VS:  BP 142/88  Pulse 70  Ht 5\' 9"  (1.753 m)  Wt 177 lb (80.287 kg)  BMI 26.13 kg/m2 Well nourished, well developed, in no acute distress HEENT: normal, Kathleen/AT, EOMI Neck: no JVD, normal carotid upstroke, no bruit Cardiac:  normal S1, S2; RRR; 9-3/8 systolic murmur right upper sternal border, radiation to the carotids as well as abdomen Lungs:  clear to auscultation bilaterally, no wheezing, rhonchi or rales Abd: soft, nontender, no hepatomegaly, no bruits Ext: no edema, 2+ distal pulses Skin: warm and dry GU: deferred Neuro: no focal abnormalities noted, AAO x 3  EKG:  04/13/14-sinus rhythm, 68, no obvious abnormalities. Echocardiogram 02/24/12-normal ejection fraction, mild aortic stenosis Labs: 11/17/73-ZWC, basic metabolic profile unremarkable.  ASSESSMENT AND PLAN:  1. Chest pain-resolved after sinus infection cleared. He's been very active and not having any  further chest discomfort. I have given him the option of exercise treadmill test. At this point we will watch his symptoms. Obviously if symptoms return, he will let me know and we will go ahead and proceed with testing. Continue with primary prevention. Very atypical chest pain previously. Several years ago, he underwent nuclear stress test/exercise treadmill test and was told that he had the second best time on the treadmill behind a varsity high school athlete. 2. Heart murmur-mild aortic stenosis previously. We will check  echocardiogram. 3. Hyperlipidemia-LDL 162 however HDL 72. Currently has fairly good diet, exercise. 4. Elevated blood pressure without diagnosis of hypertension-if his blood pressure remains greater than 140/90, I would encourage him to resume lisinopril. BP continue to discuss this with his primary physician. Also decreasing overall alcohol intake can help with this as well. 5. I will followup with echocardiogram.  Signed, Candee Furbish, MD [facility]  05/23/2014 4:15 PM

## 2014-05-23 NOTE — Patient Instructions (Signed)
The current medical regimen is effective;  continue present plan and medications.  Your physician has requested that you have an echocardiogram. Echocardiography is a painless test that uses sound waves to create images of your heart. It provides your doctor with information about the size and shape of your heart and how well your heart's chambers and valves are working. This procedure takes approximately one hour. There are no restrictions for this procedure.  Follow up as needed. 

## 2014-05-24 ENCOUNTER — Ambulatory Visit (HOSPITAL_COMMUNITY): Payer: Medicare Other | Attending: Cardiovascular Disease | Admitting: Cardiology

## 2014-05-24 DIAGNOSIS — I10 Essential (primary) hypertension: Secondary | ICD-10-CM | POA: Insufficient documentation

## 2014-05-24 DIAGNOSIS — I350 Nonrheumatic aortic (valve) stenosis: Secondary | ICD-10-CM

## 2014-05-24 DIAGNOSIS — I359 Nonrheumatic aortic valve disorder, unspecified: Secondary | ICD-10-CM

## 2014-05-24 DIAGNOSIS — I079 Rheumatic tricuspid valve disease, unspecified: Secondary | ICD-10-CM | POA: Insufficient documentation

## 2014-05-24 DIAGNOSIS — R011 Cardiac murmur, unspecified: Secondary | ICD-10-CM | POA: Diagnosis not present

## 2014-05-24 DIAGNOSIS — I059 Rheumatic mitral valve disease, unspecified: Secondary | ICD-10-CM | POA: Insufficient documentation

## 2014-05-24 DIAGNOSIS — E785 Hyperlipidemia, unspecified: Secondary | ICD-10-CM | POA: Diagnosis not present

## 2014-05-24 NOTE — Progress Notes (Signed)
Echo performed. 

## 2014-05-25 ENCOUNTER — Telehealth: Payer: Self-pay | Admitting: Cardiology

## 2014-05-25 NOTE — Telephone Encounter (Signed)
Reviewed echo results with patient who verbalized understanding.  Patient questioned how he would be notified of f/u echo in 1 year.  I advised patient I am placing a recall in epic for this reminder and patient verbalized understanding and gratitude.

## 2014-05-25 NOTE — Telephone Encounter (Signed)
New message ° ° ° ° ° °Want echo results °

## 2014-06-01 ENCOUNTER — Other Ambulatory Visit (HOSPITAL_COMMUNITY): Payer: Medicare Other

## 2014-06-20 ENCOUNTER — Telehealth: Payer: Self-pay | Admitting: Cardiology

## 2014-06-20 ENCOUNTER — Telehealth: Payer: Self-pay

## 2014-06-20 NOTE — Telephone Encounter (Signed)
°  Patient would like to speak with you regarding his ultrasound. Please call and advise.

## 2014-06-20 NOTE — Telephone Encounter (Signed)
Pt is aware that dr Joseph Art is out of the office until 07/03/14, but still wanted to leave the dr a message to call him. The patient wants to talk with dr Joseph Art, specifically, regarding a recent cardiologist appt that he had, as well as ultrasound results

## 2014-06-20 NOTE — Telephone Encounter (Signed)
Spoke with pt who states he is continuing to have s/s of heaviness across the top of his chest - it comes on slowly and can last a couple of days - resolves on it's own.  He can not tell if anything brings it on.  He is not specifically SOB.  States he spends a lot of time outside and it has been very hot and humid.  He wonders if he has allergies.  Has nasal congestion but no cough.  He takes zyrtec daily.  He is going to sleep in his one air conditioned room for the next several nights and see if he improves any.  He will continue to monitor s/s and call back if no improvement.

## 2014-06-21 NOTE — Telephone Encounter (Signed)
For follow-up

## 2014-06-21 NOTE — Telephone Encounter (Signed)
Pt is being followed by Cardiology for his continued chest "heaviness". Advised pt to give [place] a call to schedule an appt.

## 2014-08-29 ENCOUNTER — Encounter: Payer: Self-pay | Admitting: Sports Medicine

## 2014-08-29 ENCOUNTER — Ambulatory Visit (INDEPENDENT_AMBULATORY_CARE_PROVIDER_SITE_OTHER): Payer: Medicare Other | Admitting: Sports Medicine

## 2014-08-29 VITALS — BP 183/78 | Ht 69.0 in | Wt 170.0 lb

## 2014-08-29 DIAGNOSIS — M75101 Unspecified rotator cuff tear or rupture of right shoulder, not specified as traumatic: Secondary | ICD-10-CM | POA: Insufficient documentation

## 2014-08-29 DIAGNOSIS — M129 Arthropathy, unspecified: Secondary | ICD-10-CM | POA: Diagnosis not present

## 2014-08-29 DIAGNOSIS — M12811 Other specific arthropathies, not elsewhere classified, right shoulder: Secondary | ICD-10-CM | POA: Diagnosis not present

## 2014-08-29 DIAGNOSIS — M25511 Pain in right shoulder: Secondary | ICD-10-CM

## 2014-08-29 DIAGNOSIS — M19011 Primary osteoarthritis, right shoulder: Secondary | ICD-10-CM | POA: Insufficient documentation

## 2014-08-29 MED ORDER — NITROGLYCERIN 0.2 MG/HR TD PT24: MEDICATED_PATCH | TRANSDERMAL | Status: DC

## 2014-08-29 NOTE — Assessment & Plan Note (Signed)
He has an obvious effusion that I thought was related to his rotator cuff tear. If treatment of the rotator cuff does not resolve this we may need to consider injection

## 2014-08-29 NOTE — Assessment & Plan Note (Signed)
This is a partial tear was only 1 cm retraction  Try a nitroglycerin protocol  Rotator cuff home rehabilitation protocol   Recheck in 4-6 weeks

## 2014-08-29 NOTE — Progress Notes (Signed)
Patient ID: Charles Robles, male   DOB: [birth year], 68 y.o.   MRN: [id number]  Patient gives a history of 2 months of right shoulder pain He fell while hiking [place] a [place] Landed on right elbow in the right shoulder He had pain at a 5 of 10 level in his right shoulder The pain now is more consistent at night injury seems to give him a little weakness with doing overhead activities He continues being active but the shoulder does not seem to be improving  Examination nad BP 183/78  Ht 5\' 9"  (1.753 m)  Wt 170 lb (77.111 kg)  BMI 25.09 kg/m2  Shoulder: Inspection reveals no abnormalities, atrophy or asymmetry. Palpation is normal with no tenderness over AC joint or bicipital groove. ROM is full in all planes. Rotator cuff strength normal throughout. Mild signs of impingement with  Neer and Hawkin's tests, empty can. Speeds and Yergason's tests normal. No labral pathology noted with negative Obrien's, negative clunk and good stability. Normal scapular function observed. No painful arc and no drop arm sign. No apprehension sign  Resisted motion of the shoulder in abduction and external rotation does not cause some pain  Ultrasound: Right shoulder There is a partial tear of the bicipital tendon starting 1-2 cm below the rotator cuff and extending down to the level of the pectoralis major tendon/  significant hypoechoic change is noted  the tendon fibers are intact laterally  A.c. joint shows arthritic narrowing and a mushroom sign  Right supraspinatous shows that the middle fibers are retracted 1 cm with hypoechoic change and anterior fibers show a hypoechoic split  Subscapularis infraspinatus and teres minor are normal

## 2014-08-29 NOTE — Patient Instructions (Signed)
Nitroglycerin Protocol   Apply 1/4 nitroglycerin patch to affected area daily.  Change position of patch within the affected area every 24 hours.  You may experience a headache during the first 1-2 weeks of using the patch, these should subside.  If you experience headaches after beginning nitroglycerin patch treatment, you may take your preferred over the counter pain reliever.  Another side effect of the nitroglycerin patch is skin irritation or rash related to patch adhesive.  Please notify our office if you develop more severe headaches or rash, and stop the patch.  Tendon healing with nitroglycerin patch may require 12 to 24 weeks depending on the extent of injury.  Men should not use if taking Viagra, Cialis, or Levitra.   Do not use if you have migraines or rosacea.   Check periodic BP while taking ibuprofen  Do the exercises preferably daily Idea is low weight but with good reps  rescan in 6 weeks

## 2014-08-29 NOTE — Assessment & Plan Note (Signed)
Shoulder pain now is primarily at night. In the past he had this from overuse while chopping wood. This episode is different and associated with acute trauma.

## 2014-09-20 NOTE — Progress Notes (Signed)
   Subjective:    Patient ID: Charles Robles, male    DOB: 1946-07-18, 68 y.o.   MRN: [id number]  HPI    Review of Systems error     Objective:   Physical Exam        Assessment & Plan:

## 2014-10-10 ENCOUNTER — Ambulatory Visit (INDEPENDENT_AMBULATORY_CARE_PROVIDER_SITE_OTHER): Payer: Medicare Other | Admitting: Sports Medicine

## 2014-10-10 ENCOUNTER — Encounter: Payer: Self-pay | Admitting: Sports Medicine

## 2014-10-10 VITALS — BP 168/82 | Ht 69.0 in | Wt 170.0 lb

## 2014-10-10 DIAGNOSIS — M12811 Other specific arthropathies, not elsewhere classified, right shoulder: Secondary | ICD-10-CM

## 2014-10-10 DIAGNOSIS — M75101 Unspecified rotator cuff tear or rupture of right shoulder, not specified as traumatic: Principal | ICD-10-CM

## 2014-10-10 MED ORDER — NITROGLYCERIN 0.2 MG/HR TD PT24: MEDICATED_PATCH | TRANSDERMAL | Status: DC

## 2014-10-10 NOTE — Patient Instructions (Signed)

## 2014-10-10 NOTE — Progress Notes (Signed)
   Subjective:    Patient ID: Charles Robles, male    DOB: October 04, 1946, 68 y.o.   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 He was last seen 10/13 and had an office [place] that showed a right partial bicipital tendon tear and right supraspinatus middle anterior fiber split with 1 cm retraction (he had had pain for about 2 months at that point after falling in a creek and landing on his elbow), so pain / symptoms have been present for about 4 months, now. He was advised to start nitroglycerin protocol and was given rotator cuff exercises; however, he fell and slipped on wet concrete later that day, landing on his right hip and shoulder, and did not pick up the nitroglycerin. He also reports he has "not done much" of the exercises; he has done "some rotator cuff exercises" with light weights (1-3 lbs) but nothing consistently. He has continued to be active, otherwise.  Of note, pt states he recently started dating and does take Viagra, and questions if nitro can be / should be used with this, and he also reports a few family members in the medical field who "voiced some concerns" to him about his potentially using nitro patches.   Review of Systems: As above. Otherwise feels well. Denies numbness / tingling / weakness in his hands or arms.     Objective:   Physical Exam BP 168/82 mmHg  Ht 5\' 9"  (1.753 m)  Wt 170 lb (77.111 kg)  BMI 25.09 kg/m2 Gen: well-appearing elderly adult male in NAD  MSK: bilateral shoulders normal to inspection without discrepancy of shoulder height at rest  Minimal tenderness over right AC joint but shoulder otherwise nontender  Painful arc with abduction in external rotation (to reach behind head),  especially  Passive unresisted ROM full and minimally painful  Active ROM in right shoulder full but with pain as above, especially with resisted external rotation at shoulder  Positive right-sided empty can, Neer's, and Hawkins testing  Left shoulder normal by comparison  No real pain on speeds or yergason's tests/ no evidence of complete tear  Neurovascular: alert, oriented, sensation grossly intact  Strength 5/5 throughout bilateral upper extremities  Exception of strength 4+/5 in resisted ext rotation and isolated supraspinatus action due to increased pain with resistance as above     Assessment & Plan:  68yo male with right rotator cuff arthropathy now for 4 months but with minimal use of recommended therapies - defer repeat US today as pt has not used patches or done exercises consistently - reiterated instructions for nitroglycerin protocol and light-weight rotator cuff exercises - new Rx for nitro patches sent in under Dr. Oneida Alar - advised to NOT use nitroglycerin patches on days on which he uses Viagra - advised f/u in 6 weeks after using nitro patches to repeat US at that time  The above was discussed in its entirety with attending physician Dr. Oneida Alar.   Emmaline Kluver, MD  PGY-3, [facility] 10/10/2014, 5:18 PM   Reviewed and agree   Ila Mcgill, MD

## 2014-10-10 NOTE — Assessment & Plan Note (Signed)
No change in pain status or exam  Has not done HEP or NTG  We reviewed these and he will try them and we will recheck P 6 weeks  Good potential to heal

## 2014-10-23 ENCOUNTER — Telehealth: Payer: Self-pay | Admitting: Radiology

## 2014-10-23 NOTE — Telephone Encounter (Signed)
Charles Robles has called patient he will come in for flu shot only.

## 2014-11-22 ENCOUNTER — Ambulatory Visit (INDEPENDENT_AMBULATORY_CARE_PROVIDER_SITE_OTHER): Payer: Medicare Other | Admitting: Sports Medicine

## 2014-11-22 ENCOUNTER — Encounter: Payer: Self-pay | Admitting: Sports Medicine

## 2014-11-22 VITALS — BP 154/86 | Ht 69.0 in | Wt 170.0 lb

## 2014-11-22 DIAGNOSIS — S83282A Other tear of lateral meniscus, current injury, left knee, initial encounter: Secondary | ICD-10-CM | POA: Diagnosis not present

## 2014-11-22 DIAGNOSIS — M25551 Pain in right hip: Secondary | ICD-10-CM | POA: Diagnosis not present

## 2014-11-22 DIAGNOSIS — M12811 Other specific arthropathies, not elsewhere classified, right shoulder: Secondary | ICD-10-CM

## 2014-11-22 DIAGNOSIS — M75101 Unspecified rotator cuff tear or rupture of right shoulder, not specified as traumatic: Principal | ICD-10-CM

## 2014-11-22 NOTE — Assessment & Plan Note (Signed)
Exacerbated by fall a few months ago.

## 2014-11-22 NOTE — Assessment & Plan Note (Signed)
[place] with 4.5cm effusion, fluid around lateral meniscus, protrusion of lateral meniscus. -Continue to wear compression sleeve during the day -Start 30 minutes on most days of bicycle to try to compress out the fluid on your knee -Start straight leg raises (10 times x 3 sets once daily) -Anti-inflammatory such as ibuprofen, aleve, or motrin if needed with food -Follow-up in 6 weeks.

## 2014-11-22 NOTE — Progress Notes (Signed)
   Subjective:    Patient ID: Charles Robles, male    DOB: 05/01/1946, 69 y.o.   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 He has had two prior falls involving an outstretched right shoulder, causing pain located supero-laterally. An Korea on 10/13 showed a right partial bicipital tendon tear and right supraspinatus middle anterior fiber split with 1 cm retraction. He has been doing the home exercise program and nitroglycerin patch for about the past 6 weeks. Of note, pt states he also occasionally takes Viagra, but does use the nitroglycerin patch on days that he takes a Viagra. His right shoulder pain is improving.  He also has concerns regarding left knee pain, which has been bothering him for the past 3 weeks. He describes that he slipped on some steps and suddenly twisted his left knee. He noticed some mild swelling afterwards. He has tried wearing his compression sleeve and ice with some relief.   Third, he also notes right lateral hip pain. This is been ongoing since his fall. Location of pain is primarily in the posterolateral buttock with occasional snapping and catching. He denies any deep groin pain. He denies any significant weakness or low back pain. It is aggravated with laying on the right side.   Past medical history, social history, medications, and allergies were reviewed and are up to date in the chart.  Review of Systems 7 point review of systems was performed and was otherwise negative unless noted in the history of present illness.    Objective:   Physical Exam BP 154/86 mmHg  Ht 5\' 9"  (1.753 m)  Wt 170 lb (77.111 kg)  BMI 25.09 kg/m2 GEN: The patient is well-developed well-nourished male and in no acute distress.  He is awake alert and oriented x3. SKIN: warm and well-perfused, no rash  EXTR: No lower extremity edema or calf tenderness Neuro: Strength 5/5 globally. Sensation  intact throughout. No focal deficits. Vasc: +2 bilateral distal pulses. No edema.  MSK:  General gait: He ambulates with a slightly antalgic gait due to his left knee pain. Examination of the right shoulder reveals full ROM with mild pain. Mild TTP over the supraspinatus insertion. Good rotator cuff strength. Negative Speed's test. No popeye deformity. Minimal pain with impingement testing. No TTP at [facility] joint.  Examination of the left knee. Trace effusion. No overlying erythema or induration. TTP at lateral joint line. ROM 0-110 with pain at maximal flexion. Mild crepitus. Negative patellar grind test. +McMurray's for pain  Examination of the right hip. Full internal and external ROM without pain. +TTP over gluteus medius and tensor fascia lata in area of greater trochanteric bursa. +Very weak with hip abduction strength testing. Otherwise, no LE atrophy or fasciculation.  Limited MSK [place]: Right Shoulder: Split of BT about 2 cm distal to  Entry to joint capsule with surrounding fluid. Fibers otherwise fairly intact. Interval improvement. Midsubstance partial thickness supraspinatus tear at insertion site without retraction. Anterior portion with some microcalcification. Posterior Supraspinatus normal. Subscapularis, infraspinatus, and TM appear normal.  Left Knee: 4.5cm effusion in suprapatellar pouch. Protrusion of left lateral meniscus with significant surrounding fluid. Fairly well preserved joint space with minimal arthritic changes. Medial meniscus appears normal. QT and PT normal.    Assessment & Plan:  Please see problem based assessment and plan in the problem list.

## 2014-11-22 NOTE — Patient Instructions (Signed)
For the shoulder: You have a partial thickness tear in your supraspinatus. -Continue home exercises once daily for the next 6 weeks -Continue nitroglycerin patch for the next 6 weeks, but not with in 12 hours of taking Viagra due to risk of lowering your blood pressure -Follow-up in 6 weeks for repeat scan.  For the knee: -Continue to wear compression sleeve during the day -Start 30 minutes on most days of bicycle to try to compress out the fluid on your knee -Start straight leg raises (10 times x 3 sets once daily) -Anti-inflammatory such as ibuprofen, aleve, or motrin if needed with food -Follow-up in 6 weeks.  For the right hip (Iliotibial Band Popping over the Greater Trochanter): -Laying on your left side, leg raises 10 times x 3 sets once daily  Call sooner if you have any questions. It was good to see you today!

## 2014-11-22 NOTE — Assessment & Plan Note (Signed)
You have a partial thickness tear in your supraspinatus. -Continue home exercises once daily for the next 6 weeks -Continue nitroglycerin patch for the next 6 weeks, but not with in 12 hours of taking Viagra due to risk of lowering your blood pressure -Follow-up in 6 weeks for repeat scan.

## 2015-01-03 ENCOUNTER — Ambulatory Visit (INDEPENDENT_AMBULATORY_CARE_PROVIDER_SITE_OTHER): Payer: Medicare Other | Admitting: Sports Medicine

## 2015-01-03 ENCOUNTER — Encounter: Payer: Self-pay | Admitting: Sports Medicine

## 2015-01-03 VITALS — BP 164/93 | HR 78 | Ht 69.0 in | Wt 170.0 lb

## 2015-01-03 DIAGNOSIS — M75101 Unspecified rotator cuff tear or rupture of right shoulder, not specified as traumatic: Principal | ICD-10-CM

## 2015-01-03 DIAGNOSIS — M12811 Other specific arthropathies, not elsewhere classified, right shoulder: Secondary | ICD-10-CM | POA: Diagnosis not present

## 2015-01-03 NOTE — Progress Notes (Signed)
   Subjective:    Patient ID: Charles Robles, male    DOB: August 19, 1946, 69 y.o.   MRN: [id number]  HPI Charles Robles Is a 69 year old male who presents for followup of right shoulder pain.  His toes of his left knee and right hip pain have improved.  Unfortunately, he suffered a fall 3 weeks ago on an outstretched right shoulder, which aggravated his symptoms.  He notes intermittent sharp pain located in the superior posterior right shoulder.  He denies any radiation.  He denies any weakness.  He is taking naproxen and ibuprofen intermittently.  He is doing a home exercise program and continues on the nitroglycerin patch, which is used for the past 6 weeks.  Please see problem based assessment and plan in the problem list.  Review of Systems 7 point review of systems was performed and was otherwise negative unless noted in the history of present illness.     Objective:   Physical Exam BP 164/93 mmHg  Pulse 78  Ht 5\' 9"  (1.753 m)  Wt 170 lb (77.111 kg)  BMI 25.09 kg/m2 GEN: The patient is well-developed well-nourished male and in no acute distress.  He is awake alert and oriented x3. SKIN: warm and well-perfused, no rash  Neuro: Strength 5/5 globally. Sensation intact throughout. No focal deficits. Vasc: +2 bilateral distal pulses. No edema.  MSK: Atrophy: [none]   Cervical ROM [Full]  Shoulder ROM: Right <---> Left   Forward flexion [180]<--->[180] ER at side [60]<--->[60] Abd ER [90]<--->[90] Abd IR [60]<--->[60] IR up back [T10]<--->[T6]  TTP:  AC joint:  [-]  Supraspinatus insertion:  [+]  Subsca/biceps:  [-]  Periscapular:  [-]  Trap:  [+]  Cuff: Impingement/cuff: Hawkins [+]   Jobes: [-]   FF strength:[5/5]   ER strength:[5/5]   Abdominal compression test:[-] Lag signs:[None]  Laxity/instabilty:  [-]  Yergason:  [-]           Speeds:  [-]    Crank:  [-]                     Active Compression: [-]  Neurovascular: [Normal sensation to light touch in median ulnar  and radial nerve distribution with good strength in hand intrinsics grip and EPL, 2+ radial pulse.]  Limited musculoskeletal ultrasound: Long and short axis views are obtained of the right shoulder.  There appears to be a fluid collection last seen on the long axis view of the biceps tendon located medially.  The biceps tendon fibers appear intact.  The supraspinatus reveals a 20% partial thickness tear seen on the footplate view, as well as the interval view.  There is no retraction.  The infraspinatus, teres minor, and acromioclavicular joints appear normal.    Assessment & Plan:  Please see problem based assessment and plan in the problem list.

## 2015-01-03 NOTE — Assessment & Plan Note (Signed)
Improving sonographically: 20% partial thickness supraspinatus tear, medial a longitudinal fluid seen adjacent to the biceps tendon in the long view -Continue HEP -Continue NTG -f/u 6 weeks or sooner prn

## 2015-02-02 ENCOUNTER — Telehealth: Payer: Self-pay | Admitting: Family Medicine

## 2015-02-02 NOTE — Telephone Encounter (Signed)
Called to see if patient wanted to get flu shot patient declined

## 2015-02-20 ENCOUNTER — Encounter: Payer: Self-pay | Admitting: Sports Medicine

## 2015-02-20 ENCOUNTER — Ambulatory Visit (INDEPENDENT_AMBULATORY_CARE_PROVIDER_SITE_OTHER): Payer: Medicare Other | Admitting: Sports Medicine

## 2015-02-20 VITALS — BP 122/77 | HR 70 | Ht 69.0 in | Wt 175.0 lb

## 2015-02-20 DIAGNOSIS — M4722 Other spondylosis with radiculopathy, cervical region: Secondary | ICD-10-CM

## 2015-02-20 DIAGNOSIS — M542 Cervicalgia: Secondary | ICD-10-CM | POA: Diagnosis not present

## 2015-02-20 DIAGNOSIS — M75101 Unspecified rotator cuff tear or rupture of right shoulder, not specified as traumatic: Secondary | ICD-10-CM

## 2015-02-20 DIAGNOSIS — M12811 Other specific arthropathies, not elsewhere classified, right shoulder: Secondary | ICD-10-CM | POA: Diagnosis not present

## 2015-02-20 DIAGNOSIS — M25551 Pain in right hip: Secondary | ICD-10-CM | POA: Diagnosis not present

## 2015-02-20 NOTE — Patient Instructions (Signed)
Try some tramadol and let me know if that helps symptoms  Take 1 pill 2 or 3 times a day for a few days and see as a test  Let's XRay your neck as I think this is coming from nerve irritation  If we didn't get good relief next step is a medication to block nerve irritation  Keep up at least motion exercises for your rotator cuff  I'll call you with neck results  Hip - keep up some hip exercise/  Keep up some stretching but I think you have chronic scar tissue over hip

## 2015-02-20 NOTE — Progress Notes (Signed)
Patient ID: Charles Robles, male   DOB: Oct 01, 1946, 69 y.o.   MRN: [id number]  Patient with hx of RC injury on RT and BT injury Both of these feel better and stronger  However, there is some chronic pain in upper trap and back of neck Radiates down into hand  He thinks he may have triggered this with the fall that led to his shoulder injury  Some chronic hip pain on lateral aspect on RT Exercises have helped after his orighinal issue with Glut med tendon injury   Exam NAD, muscular older man BP 122/77 mmHg  Pulse 70  Ht 5\' 9"  (1.753 m)  Wt 175 lb (79.379 kg)  BMI 25.83 kg/m2  Shoulder: Inspection reveals no abnormalities, atrophy or asymmetry. Palpation is normal with no tenderness over AC joint or bicipital groove. ROM is full in all planes. Rotator cuff strength normal throughout. No signs of impingement with negative Neer and Hawkin's tests, empty can. Speeds and Yergason's tests normal. No labral pathology noted with negative Obrien's, negative clunk and good stability. Normal scapular function observed. No painful arc and no drop arm sign. No apprehension sign  Note his impingement and BT tests have normalized  NEck exam Limited extension/ good flexion/ limited rotation to left Lateral bend is OK  Hip ROM on RT is norm Still some TTP post to GRT troch

## 2015-02-20 NOTE — Assessment & Plan Note (Signed)
Still with some recurrent sxs  Keep up some exercises and stretches that seem to help  We will reassess if worsens

## 2015-02-20 NOTE — Assessment & Plan Note (Signed)
RC injury seems to have improved on testing  Now he is not limited in activity and even pain free after canoeing this past week  Keep up some RC exercises for maintance

## 2015-02-22 ENCOUNTER — Ambulatory Visit
Admission: RE | Admit: 2015-02-22 | Discharge: 2015-02-22 | Disposition: A | Discharge status: Home / Self Care | Payer: Medicare Other | Source: Ambulatory Visit | Attending: Sports Medicine | Admitting: Sports Medicine

## 2015-02-22 DIAGNOSIS — M4722 Other spondylosis with radiculopathy, cervical region: Secondary | ICD-10-CM

## 2015-02-22 DIAGNOSIS — M5032 Other cervical disc degeneration, mid-cervical region: Secondary | ICD-10-CM | POA: Diagnosis not present

## 2015-02-28 ENCOUNTER — Other Ambulatory Visit: Payer: Self-pay | Admitting: *Deleted

## 2015-02-28 DIAGNOSIS — IMO0002 Reserved for concepts with insufficient information to code with codable children: Secondary | ICD-10-CM

## 2015-02-28 NOTE — Progress Notes (Signed)
[facility] Friday April 15th at 1:10pm [place],  [place], [place] [address] [phone]

## 2015-03-02 ENCOUNTER — Ambulatory Visit
Admission: RE | Admit: 2015-03-02 | Discharge: 2015-03-02 | Disposition: A | Discharge status: Home / Self Care | Payer: Medicare Other | Source: Ambulatory Visit | Attending: Sports Medicine | Admitting: Sports Medicine

## 2015-03-02 DIAGNOSIS — IMO0002 Reserved for concepts with insufficient information to code with codable children: Secondary | ICD-10-CM

## 2015-03-14 ENCOUNTER — Other Ambulatory Visit: Payer: Self-pay | Admitting: Sports Medicine

## 2015-03-14 DIAGNOSIS — IMO0002 Reserved for concepts with insufficient information to code with codable children: Secondary | ICD-10-CM

## 2015-03-15 ENCOUNTER — Ambulatory Visit
Admission: RE | Admit: 2015-03-15 | Discharge: 2015-03-15 | Disposition: A | Discharge status: Home / Self Care | Payer: Medicare Other | Source: Ambulatory Visit | Attending: Sports Medicine | Admitting: Sports Medicine

## 2015-03-15 DIAGNOSIS — IMO0002 Reserved for concepts with insufficient information to code with codable children: Secondary | ICD-10-CM

## 2015-03-15 DIAGNOSIS — M85852 Other specified disorders of bone density and structure, left thigh: Secondary | ICD-10-CM | POA: Diagnosis not present

## 2015-03-21 ENCOUNTER — Encounter: Payer: Self-pay | Admitting: Sports Medicine

## 2015-04-02 DIAGNOSIS — H18603 Keratoconus, unspecified, bilateral: Secondary | ICD-10-CM | POA: Diagnosis not present

## 2015-04-02 DIAGNOSIS — H2513 Age-related nuclear cataract, bilateral: Secondary | ICD-10-CM | POA: Diagnosis not present

## 2015-04-02 DIAGNOSIS — H52203 Unspecified astigmatism, bilateral: Secondary | ICD-10-CM | POA: Diagnosis not present

## 2015-04-03 ENCOUNTER — Ambulatory Visit (INDEPENDENT_AMBULATORY_CARE_PROVIDER_SITE_OTHER): Payer: Medicare Other | Admitting: Sports Medicine

## 2015-04-03 ENCOUNTER — Encounter: Payer: Self-pay | Admitting: Sports Medicine

## 2015-04-03 VITALS — BP 156/92 | HR 76 | Ht 68.0 in | Wt 172.0 lb

## 2015-04-03 DIAGNOSIS — M12811 Other specific arthropathies, not elsewhere classified, right shoulder: Secondary | ICD-10-CM

## 2015-04-03 DIAGNOSIS — M4722 Other spondylosis with radiculopathy, cervical region: Secondary | ICD-10-CM

## 2015-04-03 DIAGNOSIS — M75101 Unspecified rotator cuff tear or rupture of right shoulder, not specified as traumatic: Secondary | ICD-10-CM

## 2015-04-03 MED ORDER — TRAMADOL HCL 50 MG PO TABS: 50.0000 mg | ORAL_TABLET | Freq: Two times a day (BID) | ORAL | Status: DC

## 2015-04-03 NOTE — Patient Instructions (Signed)
Please add hopping in place to your regimen.  Do this for 30 seconds at a time, 3 times per day.  This will help with your bone density.  Continue the exercises for your shoulder, though do not do over head lifts to limit load on your neck.

## 2015-04-03 NOTE — Assessment & Plan Note (Signed)
Is improved at this time. Will d/c nitro patch. Continue exercises for maintenance.

## 2015-04-03 NOTE — Progress Notes (Signed)
   Subjective:    Patient ID: Charles Robles, male    DOB: 06-Jul-1946, 69 y.o.   MRN: [id number]  HPI Patient is a 69 yo male presenting in follow-up for right shoulder and neck pain. Notes his pain is still present. Has two different types of pain. One in shoulder that is a dull ache and worsened by reaching behind him and another that is a "sharp narrow" pain that shoots down from his neck to his right arm. He notes the dull ache has improved with nitro patch and exercises in the past. He notes in February he slipped and fell on his deck and suffered a compression fracture that resulted in nerve impingement. This resulted in the sharp narrow pain to his right arm. No numbness or weakness noted. Has been taking ibuprofen and naproxen separately.    Review of Systems     Objective:   Physical Exam  Constitutional: He appears well-developed and well-nourished.  HENT:  Head: Normocephalic and atraumatic.  Musculoskeletal:  Right shoulder with no swelling, no tenderness to palpation, there is full ROM, there is some discomfort on active and passive internal rotation, 5/5 strength internal and external rotation, abduction, and empty can, negative empty can test Left shoulder  with no swelling, no tenderness to palpation, there is full ROM, no discomfort on ROM, 5/5 strength internal and external rotation, abduction, and empty can, negative empty can test Sensation to light touch intact bilateral UE 2+ radial pulse No midline neck tenderness Limited extension, good flexion and lateral rotation Negative spurlings   Assessment & Plan:  Please see problem specific charting for A/P.  Tommi Rumps, MD [facility] PGY3  Agree w assessment.  [employer]

## 2015-04-03 NOTE — Assessment & Plan Note (Signed)
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 Discussed diagnosis of osteopenia as well. Will place in cervical neck brace to help with function while reading. Tramadol to be added for discomfort. Will d/c any overhead lifts, though can continue other exercises for rotator cuff. Added hopping to this regimen to help build bone density and advised to start on vitamin D and calcium supplement. F/u in 2-3 months to check on progress.

## 2015-05-22 ENCOUNTER — Ambulatory Visit (INDEPENDENT_AMBULATORY_CARE_PROVIDER_SITE_OTHER): Payer: Medicare Other | Admitting: Sports Medicine

## 2015-05-22 ENCOUNTER — Encounter: Payer: Self-pay | Admitting: Sports Medicine

## 2015-05-22 VITALS — BP 149/68 | HR 75 | Ht 68.0 in | Wt 172.0 lb

## 2015-05-22 DIAGNOSIS — M4722 Other spondylosis with radiculopathy, cervical region: Secondary | ICD-10-CM | POA: Diagnosis not present

## 2015-05-22 DIAGNOSIS — M12811 Other specific arthropathies, not elsewhere classified, right shoulder: Secondary | ICD-10-CM | POA: Diagnosis not present

## 2015-05-22 DIAGNOSIS — M75101 Unspecified rotator cuff tear or rupture of right shoulder, not specified as traumatic: Secondary | ICD-10-CM

## 2015-05-22 NOTE — Assessment & Plan Note (Signed)
Is improved, continues to be pain free. Continue exercises for maintenance.

## 2015-05-22 NOTE — Assessment & Plan Note (Signed)
XR showed DDD at C 5-6 and C 6-7 with loss of disc space. His ROM and pain have improved with physical therapy. Patient was instructed to do a series off isometric neck exercises daily, continue the postural exercises given by PT, and continue to take calcium and vit D.

## 2015-05-22 NOTE — Progress Notes (Signed)
Subjective:     Patient ID: Charles Robles, male   DOB: 1946/11/05, 69 y.o.   MRN: [id number]  HPI  Patient is a 69 yo male presenting for follow up for right shoulder and neck pain. His neck pain and shoulder pain have improved since last visit in May. He attended three physical therapy sessions Audrea Muscat and found them very effective. She gave him posture exercises and stretches to help his neck and shoulder. He has been taking calcium and has been getting his vitamin D via sun exposure.  His shoulder pain began after a fall in February 2016. Xray showed degenerative disc disease at C 5-6 and C 6-7 and loss of disc space.  Review of Systems     Objective:   Physical Exam General: pleasant man, NAD BP 149/68 mmHg  Pulse 75  Ht 5\' 8"  (1.727 m)  Wt 172 lb (78.019 kg)  BMI 26.16 kg/m2  MSK: Right shoulder: no swelling, erythema, atrophy noted. Full ROM. No tenderness to palpation over [facility] joint or bicipital groove. 5/5 external and internal rotation, abduction, adduction. Negative empty can test. UE neurovascularly intact.  Left shoulder: no swelling, erythema. Full ROM. No tenderness to palpation over [facility] joint or bicipital groove. 5/5 external and internal rotation, abduction, adduction. Negative empty can test. UE neurovascularly intact.  Neck: Full ROM with good flexion, extension, rotation, and lateral bend. No midline neck tenderness.     Assessment:     Please see problem specific charting for A/P.    Plan:     Please see problem specific charting for A/P.

## 2015-07-11 ENCOUNTER — Encounter: Payer: Self-pay | Admitting: Sports Medicine

## 2015-07-11 ENCOUNTER — Ambulatory Visit (INDEPENDENT_AMBULATORY_CARE_PROVIDER_SITE_OTHER): Payer: Medicare Other | Admitting: Sports Medicine

## 2015-07-11 VITALS — BP 163/74 | HR 66 | Ht 68.0 in | Wt 172.0 lb

## 2015-07-11 DIAGNOSIS — G5751 Tarsal tunnel syndrome, right lower limb: Secondary | ICD-10-CM | POA: Diagnosis not present

## 2015-07-11 NOTE — Patient Instructions (Signed)
Tarsal Tunnel Syndrome with Rehab Tarsal tunnel syndrome is a condition that involves pressure (compression) on the nerve in the ankle (posterior tibial nerve) and results in pain and loss of feeling on the bottom of the foot. The nerve is usually compressed by other structures within the ankle. SYMPTOMS   Signs of nerve damage: pain, numbness, tingling, and loss of feeling along the bottom of the foot.  Pain that worsens with activity.  Feeling a lack of stability in the ankle. CAUSES  Tarsal tunnel syndrome is caused by structures within the ankle placing pressure on the nerve inside the ankle, which causes sensations in the bottom of the foot. Common sources of pressure include:  Ligament-like tissue (retinaculum) that covers the nerve area in the ankle (tarsal tunnel).  Bony spurs or bumps.  Inflamed tendons (tendonitis). RISK INCREASES WITH:  Stretched ankle ligaments, which create a loose joint.  Flat feet.  Arthritis of the ankle.  Inflammation of tendons in the foot and ankle.  Previous foot or ankle injury. PREVENTION  Warm up and stretch properly before activity.  Maintain physical fitness:  Strength, flexibility, and endurance.  Cardiovascular fitness (increases heart rate).  Wear properly fitted shoes.  Wear arch supports (orthotics), if you have flat feet.  Protect the ankle with taping, braces, or compression bandages. PROGNOSIS  If treated properly, the symptoms of tarsal tunnel syndrome usually go away with non-surgical treatment. Occasionally, surgery is necessary to free the compressed nerve.  RELATED COMPLICATIONS  Permanent nerve damage, including pain, numbness, tingling, or weakness in the ankle. TREATMENT Treatment first involves resting from any activities that aggravate the symptoms, as well as the use of ice and medicine to reduce pain and inflammation. The use of strengthening and stretching exercises may help reduce pain from activity.  Other treatments include wearing arch supports, if you have flat feet, and cross training (training in various physical activities) to reduce stress on the foot and ankle. If symptoms persist, despite non-surgical treatment, then surgery may be recommended. Surgery usually provides full relief from symptoms.  MEDICATION   If pain medicine is necessary, nonsteroidal anti-inflammatory medicines (aspirin and ibuprofen), or other minor pain relievers (acetaminophen), are often recommended.  Do not take pain medication for 7 days before surgery.  Prescription pain relievers may be given if your caregiver thinks they are necessary. Use only as directed and only as much as you need. HEAT AND COLD  Cold treatment (icing) relieves pain and reduces inflammation. Cold treatment should be applied for 10 to 15 minutes every 2 to 3 hours, and immediately after any activity that aggravates your symptoms. Use ice packs or an ice massage.  Heat treatment may be used prior to performing stretching and strengthening activities prescribed by your caregiver, physical therapist, or athletic trainer. Use a heat pack or a warm water soak. SEEK MEDICAL CARE IF:  Treatment does not seem to help, or the condition worsens.  Any medicines produce negative side effects.  Any complications from surgery occur:  Pain, numbness, or coldness in the affected foot.  Discoloration beneath the toenails (blue or gray) of the affected foot.  Signs of infections (fever, pain, inflammation, redness, or persistent bleeding). EXERCISES RANGE OF MOTION (ROM) AND STRETCHING EXERCISES - Tarsal Tunnel Syndrome (Posterior Tibial Nerve Compression) These exercises may help you when beginning to restore activity to your injured foot. Complete these exercises with caution. Nerves are very sensitive tissue. They must be exercised gently. Never force a motion and do not push through  discomfort. Notify your caregiver of any exercises which  increase your pain or worsen your symptoms. Your symptoms may go away with or without further involvement from your physician, physical therapist or athletic trainer. While completing these exercises, remember:   Restoring tissue flexibility helps normal motion to return to the joints. This allows healthier, less painful movement and activity.  An effective stretch should be held for at least 30 seconds.  A stretch should never be painful. You should only feel a gentle lengthening or release in the stretched tissue. STRETCH - Gastroc, Standing  Place hands on wall.  Extend right / left leg behind you and place a folded washcloth under the arch of your foot for support. Keep the front knee somewhat bent.  Slightly point your toes inward on your back foot.  Keeping your right / left heel on the floor and your knee straight, shift your weight toward the wall, not allowing your back to arch.  You should feel a gentle stretch in the right / left calf. Hold this position for __________ seconds. Repeat __________ times. Complete this stretch __________ times per day. STRETCH - Soleus, Standing  Place hands on wall.  Extend right / left leg behind you and place a folded washcloth under the arch of your foot for support. Keep the front knee somewhat bent.  Slightly point your toes inward on your back foot.  Keep your right / left heel on the floor, bend your back knee, and slightly shift your weight over the back leg so that you feel a gentle stretch deep in your back calf.  Hold this position for __________ seconds. Repeat __________ times. Complete this stretch __________ times per day. RANGE OF MOTION - Toe Extension, Flexion  Sit with your right / left leg crossed over your opposite knee.  Grasp your toes and gently pull them back toward the top of your foot. You should feel a stretch on the bottom of your toes and foot.  Hold this stretch for __________ seconds.  Now, gently pull  your toes toward the bottom of your foot. You should feel a stretch on the top of your toes and foot.  Hold this stretch for __________ seconds. Repeat __________ times. Complete this stretch __________ times per day.  RANGE OF MOTION - Ankle Eversion  Sit with your right / left ankle crossed over your opposite knee.  Grip your foot with your opposite hand, placing your thumb on the top of your foot and your fingers across the bottom of your foot.  Gently push your foot downward with a slight rotation so your littlest toes rise slightly toward the ceiling.  You should feel a gentle stretch on the inside of your ankle. Hold the stretch for __________ seconds. Repeat __________ times. Complete this exercise __________ times per day.  RANGE OF MOTION - Ankle Dorsiflexion, Active Assisted  Remove shoes and sit on a chair, preferably not on a carpeted surface.  Place right / left foot on the floor, directly under knee. Extend your opposite leg for support.  Keeping your heel down, slide your right / left foot back toward the chair until you feel a stretch at your ankle or calf. If you do not feel a stretch, slide your bottom forward to the edge of the chair while still keeping your heel down.  Hold this stretch for __________ seconds. Repeat __________ times. Complete this stretch __________ times per day.  STRETCH - Hamstrings, Supine  Lie on your back. Loop  a belt or towel over the ball of your right / left foot.  Straighten your right / left knee and slowly pull on the belt to raise your leg. Do not allow the right / left knee to bend. Keep your opposite leg flat on the floor.  Raise the leg until you feel a gentle stretch behind your right / left knee or thigh. Hold this position for __________ seconds. Repeat __________ times. Complete this stretch __________ times per day.  STRETCH - Hamstrings, Doorway  Lie on your back with your right / left leg extended and resting on the wall  and the opposite leg flat on the ground through the door. Initially, position your bottom farther away from the wall than the illustration shows.  Keep your right / left knee straight. If you feel a stretch behind your knee or thigh, hold this position for __________ seconds.  If you do not feel a stretch, scoot your bottom closer to the door and hold __________ seconds. Repeat __________ times. Complete this stretch __________ times per day.  STRETCH - Hamstrings, Standing  Stand or sit, and extend your right / left leg, placing your foot on a chair or foot stool  Keep a slight arch in your low back, and keep your hips straight forward.  Lead with your chest and lean forward at the waist, until you feel a gentle stretch in the back of your right / left knee or thigh. (When done correctly, this exercise requires leaning only a small distance.)  Hold this position for __________ seconds. Repeat __________ times. Complete this stretch __________ times per day. STRENGTHENING EXERCISES - Tarsal Tunnel Syndrome (Posterior Tibial Nerve Compression) These exercises may help you when beginning to restore activity to your injured foot. Your symptoms may go away with or without further involvement from your physician, physical therapist or athletic trainer. While completing these exercises, remember:   Muscles can gain both the endurance and the strength needed for everyday activities through controlled exercises.  Complete these exercises as instructed by your physician, physical therapist or athletic trainer. Increase the resistance and repetitions only as guided.  You may experience muscle soreness or fatigue, but the pain or discomfort you are trying to eliminate should never worsen during these exercises. If this pain does worsen, stop and make certain you are following the directions exactly. If the pain is still present after adjustments, discontinue the exercise until you can discuss the trouble  with your caregiver. STRENGTH - Dorsiflexors  Secure a rubber exercise band or tubing to a fixed object (table, pole) and loop the other end around your right / left foot.  Sit on the floor facing the fixed object. The band should be slightly tense when your foot is relaxed.  Slowly draw your foot back toward you, using your ankle and toes.  Hold this position for __________ seconds. Slowly release the tension in the band and return your foot to the starting position. Repeat __________ times. Complete this exercise __________ times per day.  STRENGTH - Plantar-flexors  Sit with your right / left leg extended. Holding onto both ends of a rubber exercise band or tubing, loop it around the ball of your foot. Keep a slight tension in the band.  Slowly push your toes away from you, pointing them downward.  Hold this position for __________ seconds. Return to the starting position slowly, controlling the tension in the band. Repeat __________ times. Complete this exercise __________ times per day.  STRENGTH -  Plantar-flexors, Standing  Stand with your feet shoulder width apart. Place your hands on a wall or table to steady yourself, using as little support as needed.  Keeping your weight evenly spread over the width of your feet, rise up on your toes.*  Hold this position for __________ seconds. Repeat __________ times. Complete this exercise __________ times per day.  *If this is too easy, shift your weight toward your right / left leg until you feel challenged. Ultimately, you may be asked to do this exercise while standing on your right / left foot only. STRENGTH - Towel Curls  Sit in a chair, on a non-carpeted surface.  Place your foot on a towel, keeping your heel on the floor.  Pull the towel toward your heel only by curling your toes. Keep your heel on the floor.  If instructed by your physician, physical therapist or athletic trainer, add ____________________ at the end of the  towel. Repeat __________ times. Complete this exercise __________ times per day. STRENGTH - Ankle Eversion  Secure one end of a rubber exercise band or tubing to a fixed object (table, pole). Loop the other end around your foot, just before your toes.  Place your fists between your knees. This will focus your strengthening at your ankle.  Drawing the band across your opposite foot, away from the pole, slowly pull your little toe out and up. Make sure the band is positioned to resist the entire motion.  Hold this position for __________ seconds.  Return to the starting position slowly, controlling the tension in the band. Repeat __________ times. Complete this exercise __________ times per day.  STRENGTH - Ankle Inversion  Secure one end of a rubber exercise band or tubing to a fixed object (table, pole). Loop the other end around your foot, just before your toes.  Place your fists between your knees. This will focus your strengthening at your ankle.  Slowly, pull your big toe up and in, making sure the band is positioned to resist the entire motion.  Hold this position for __________ seconds.  Return to the starting position slowly, controlling the tension in the band. Repeat __________ times. Complete this exercises __________ times per day.  Document Released: 11/03/2005 Document Revised: 01/26/2012 Document Reviewed: 02/15/2009 [facility] Patient Information 2015 [place], [place]. This information is not intended to replace advice given to you by your health care provider. Make sure you discuss any questions you have with your health care provider.

## 2015-07-11 NOTE — Assessment & Plan Note (Addendum)
Foot pain mostly likely due to tarsal tunnel syndrome given positive Tinel's sign, fluid collection seen on ultrasound, and normal appearance of plantar fascia. Patient fitted with Hapad insoles with scaphoid padding. Will follow up after testing this for 1 mo or as needed.

## 2015-07-11 NOTE — Progress Notes (Signed)
    Subjective:  Charles Robles is a 69 y.o. male who presents to the [facility] today with a chief complaint of right foot pain.   HPI: Right Foot Pain Patient reports onset of right foot pain approximately 3-4 weeks ago. Denies any injury, though thinks it may have been precipitated by playing ping pong. Pain is mostly located in the arch of his foot, but has been located diffusely throughout his foot. Pain was initially intermittent but has now become more constant in nature. Pain has improved over the last several days. Patient reports that his first step in the morning is usually very painful.    Objective:  Physical Exam: BP 163/74 mmHg  Pulse 66  Ht 5\' 8"  (1.727 m)  Wt 172 lb (78.019 kg)  BMI 26.16 kg/m2  Gen: NAD, resting comfortably Lungs: NWOB MSK:  -Right Foot: No gross deformities. Strength 5/5 in all directions. Tender to palpation along plantar fascia, most significantly at insertion. Positine Tinel's sign during ultrasound exam. Decreased longitudinal arch. Splaying of first ray observed during ambulation.  -Left foot: No gross deformities. Strength 5/5 in all directions. Nontender to palpation.  Skin: warm, dry Neuro: grossly normal, moves all extremities Psych: Normal affect and thought content  Ultrasound: Fluid collection noted along Flexor Hallucis Tendon. Plantar fascia with normal thickness. Some thickening of tibial nerve in TT/  Assessment/Plan:  Tarsal tunnel syndrome of right side Foot pain mostly likely due to tarsal tunnel syndrome given positive Tinel's sign, fluid collection seen on ultrasound, and normal appearance of plantar fascia. Patient fitted with Hapad insoles with scaphoid padding. Will follow up as needed.     Algis Greenhouse. Jerline Pain, [place] Resident PGY-2 07/11/2015 12:20 PM   Agree with assessment and I observed and evaluated face to face.  Ysidro Evert

## 2015-08-14 DIAGNOSIS — D485 Neoplasm of uncertain behavior of skin: Secondary | ICD-10-CM | POA: Diagnosis not present

## 2015-08-14 DIAGNOSIS — L570 Actinic keratosis: Secondary | ICD-10-CM | POA: Diagnosis not present

## 2015-08-14 DIAGNOSIS — L304 Erythema intertrigo: Secondary | ICD-10-CM | POA: Diagnosis not present

## 2015-08-14 DIAGNOSIS — C44519 Basal cell carcinoma of skin of other part of trunk: Secondary | ICD-10-CM | POA: Diagnosis not present

## 2015-09-06 ENCOUNTER — Telehealth: Payer: Self-pay | Admitting: Family Medicine

## 2015-09-06 NOTE — Telephone Encounter (Signed)
LEFT A MESSAGE FOR PATIENT TO RETURN CALL TO SCHEDULE APPONTMENT

## 2015-09-06 NOTE — Telephone Encounter (Signed)
PATIENT RETURNED CALL, AND HE WILL CALL BACK ON 09/07/15 AND SCHEDULE HIS APPOINTMENT.  HE NEEDS A VISIT HTN, COLONOSCOPY, FLU VACCINE, AND PREVNAR

## 2015-10-04 DIAGNOSIS — L570 Actinic keratosis: Secondary | ICD-10-CM | POA: Diagnosis not present

## 2015-10-04 DIAGNOSIS — C44519 Basal cell carcinoma of skin of other part of trunk: Secondary | ICD-10-CM | POA: Diagnosis not present

## 2015-10-04 DIAGNOSIS — D485 Neoplasm of uncertain behavior of skin: Secondary | ICD-10-CM | POA: Diagnosis not present

## 2015-10-15 ENCOUNTER — Encounter: Payer: Self-pay | Admitting: Internal Medicine

## 2015-11-18 DIAGNOSIS — C4491 Basal cell carcinoma of skin, unspecified: Secondary | ICD-10-CM

## 2015-11-18 HISTORY — PX: COLONOSCOPY: SHX174

## 2015-11-18 HISTORY — DX: Basal cell carcinoma of skin, unspecified: C44.91

## 2015-12-13 ENCOUNTER — Telehealth: Payer: Self-pay | Admitting: Family Medicine

## 2015-12-13 NOTE — Telephone Encounter (Signed)
Patient returned call to schedule annual exam.  He will be seeing Dr. Laney Pastor on 01-16-16 at 10:45.  His regular eye doctor is Luberta Mutter and I will call her to get a report.  He does not do colonoscopies so I will update his chart.

## 2016-01-16 ENCOUNTER — Encounter: Payer: Self-pay | Admitting: Internal Medicine

## 2016-01-16 ENCOUNTER — Ambulatory Visit (INDEPENDENT_AMBULATORY_CARE_PROVIDER_SITE_OTHER): Payer: Medicare Other | Admitting: Internal Medicine

## 2016-01-16 VITALS — BP 173/92 | HR 71 | Temp 98.4°F | Resp 18 | Ht 68.0 in | Wt 176.0 lb

## 2016-01-16 DIAGNOSIS — H6123 Impacted cerumen, bilateral: Secondary | ICD-10-CM | POA: Diagnosis not present

## 2016-01-16 DIAGNOSIS — K599 Functional intestinal disorder, unspecified: Secondary | ICD-10-CM

## 2016-01-16 DIAGNOSIS — E785 Hyperlipidemia, unspecified: Secondary | ICD-10-CM

## 2016-01-16 DIAGNOSIS — R3915 Urgency of urination: Secondary | ICD-10-CM | POA: Diagnosis not present

## 2016-01-16 DIAGNOSIS — Z0000 Encounter for general adult medical examination without abnormal findings: Secondary | ICD-10-CM

## 2016-01-16 DIAGNOSIS — H9193 Unspecified hearing loss, bilateral: Secondary | ICD-10-CM | POA: Diagnosis not present

## 2016-01-16 DIAGNOSIS — R5383 Other fatigue: Secondary | ICD-10-CM

## 2016-01-16 DIAGNOSIS — I10 Essential (primary) hypertension: Secondary | ICD-10-CM | POA: Diagnosis not present

## 2016-01-16 DIAGNOSIS — H5461 Unqualified visual loss, right eye, normal vision left eye: Secondary | ICD-10-CM

## 2016-01-16 DIAGNOSIS — R159 Full incontinence of feces: Secondary | ICD-10-CM | POA: Diagnosis not present

## 2016-01-16 DIAGNOSIS — I350 Nonrheumatic aortic (valve) stenosis: Secondary | ICD-10-CM

## 2016-01-16 LAB — CBC WITH DIFFERENTIAL/PLATELET
BASOS ABS: 0 10*3/uL (ref 0.0–0.1)
BASOS PCT: 0 % (ref 0–1)
EOS PCT: 1 % (ref 0–5)
Eosinophils Absolute: 0.1 10*3/uL (ref 0.0–0.7)
HEMATOCRIT: 44.8 % (ref 39.0–52.0)
HEMOGLOBIN: 15 g/dL (ref 13.0–17.0)
LYMPHS PCT: 23 % (ref 12–46)
Lymphs Abs: 1.3 10*3/uL (ref 0.7–4.0)
MCH: 31.3 pg (ref 26.0–34.0)
MCHC: 33.5 g/dL (ref 30.0–36.0)
MCV: 93.5 fL (ref 78.0–100.0)
MONO ABS: 0.6 10*3/uL (ref 0.1–1.0)
MPV: 10.4 fL (ref 8.6–12.4)
Monocytes Relative: 11 % (ref 3–12)
NEUTROS ABS: 3.6 10*3/uL (ref 1.7–7.7)
Neutrophils Relative %: 65 % (ref 43–77)
Platelets: 188 10*3/uL (ref 150–400)
RBC: 4.79 MIL/uL (ref 4.22–5.81)
RDW: 13.4 % (ref 11.5–15.5)
WBC: 5.6 10*3/uL (ref 4.0–10.5)

## 2016-01-16 LAB — POC HEMOCCULT BLD/STL (OFFICE/1-CARD/DIAGNOSTIC): Fecal Occult Blood, POC: NEGATIVE

## 2016-01-16 LAB — POC MICROSCOPIC URINALYSIS (UMFC): MUCUS RE: ABSENT

## 2016-01-16 LAB — HEPATITIS C ANTIBODY: HCV Ab: NEGATIVE

## 2016-01-16 LAB — POCT URINALYSIS DIP (MANUAL ENTRY)
BILIRUBIN UA: NEGATIVE
BILIRUBIN UA: NEGATIVE
Blood, UA: NEGATIVE
GLUCOSE UA: NEGATIVE
LEUKOCYTES UA: NEGATIVE
NITRITE UA: NEGATIVE
Protein Ur, POC: NEGATIVE
Spec Grav, UA: <=1.005
Urobilinogen, UA: 0.2
pH, UA: 6

## 2016-01-16 MED ORDER — LISINOPRIL 10 MG PO TABS: 10.0000 mg | ORAL_TABLET | Freq: Every day | ORAL | Status: DC

## 2016-01-17 ENCOUNTER — Encounter: Payer: Self-pay | Admitting: Internal Medicine

## 2016-01-17 DIAGNOSIS — E785 Hyperlipidemia, unspecified: Secondary | ICD-10-CM | POA: Insufficient documentation

## 2016-01-17 LAB — LIPID PANEL
CHOLESTEROL: 290 mg/dL — AB (ref 125–200)
HDL: 85 mg/dL (ref >=40)
LDL Cholesterol: 167 mg/dL — ABNORMAL HIGH (ref <130)
TRIGLYCERIDES: 191 mg/dL — AB (ref <150)
Total CHOL/HDL Ratio: 3.4 Ratio (ref <=5.0)
VLDL: 38 mg/dL — ABNORMAL HIGH (ref <30)

## 2016-01-17 LAB — COMPREHENSIVE METABOLIC PANEL
ALK PHOS: 57 U/L (ref 40–115)
ALT: 20 U/L (ref 9–46)
AST: 19 U/L (ref 10–35)
Albumin: 4.1 g/dL (ref 3.6–5.1)
BILIRUBIN TOTAL: 0.6 mg/dL (ref 0.2–1.2)
BUN: 16 mg/dL (ref 7–25)
CALCIUM: 9.2 mg/dL (ref 8.6–10.3)
CO2: 22 mmol/L (ref 20–31)
CREATININE: 0.98 mg/dL (ref 0.70–1.18)
Chloride: 104 mmol/L (ref 98–110)
GLUCOSE: 90 mg/dL (ref 65–99)
Potassium: 4.2 mmol/L (ref 3.5–5.3)
SODIUM: 137 mmol/L (ref 135–146)
Total Protein: 7.1 g/dL (ref 6.1–8.1)

## 2016-01-17 LAB — PSA: PSA: 0.77 ng/mL (ref <=4.00)

## 2016-01-17 LAB — TSH: TSH: 0.99 mIU/L (ref 0.40–4.50)

## 2016-01-17 NOTE — Progress Notes (Addendum)
Subjective:    Patient ID: Charles Robles, male    DOB: [birth year], 70 y.o.   MRN: [id number]  HPI here for annual Medicare encounter and multiple problems #1 he is concerned about a tick bite from one month ago on his back he cannot see whether there is a rash. He has had no new systemic symptoms #2 he continues to have problems with fatigue in general although it is sporadic. Has a hard time all in the sleep lab and stays in the bed for 10 or 11 hours. He wakes refreshed two thirds of the mornings. There are days when he feels more fatigued than others but has no daytime somnolence on most days. No driving somnolence. No paroxysmal nocturnal dyspnea. No history of snoring or obstruction or sleep apnea. He uses doxylamine for sleep. The problem seems to be worse over the last year. #3 his blood pressure is elevated. He has a diagnosis of hypertension but he chose not to take medication over the last several years. It is not clear why. In general his lifestyle tends toward homeopathic approaches in many areas #4 he has aortic stenosis as documented by Dr.Skains-he missed his follow-up last summer. He denies any dyspnea on exertion or excessive exertional fatigue. #5 he has frequent problems with wax accumulation in his ears which affects his hearing. He has some decrease in hearing compatible with age but not affecting him enough to want to do anything about it. #6 he has developed problems with loose bowel movements 2-3 times a day over the past several months. Over the past 2 months he has had episodes of sudden urge to defecate with an explosive stool resulting. On 2 occasions he has had stool incontinence with this urge. He denies problems with constipation. This does not seem to be associated with dietary changes. He has never had a colonoscopy. He has occasional hemorrhoids but no stool blood. He has anal pruritus a few times a month for unclear reasons. He has had no weight loss.  #7 he  has urinary frequency and urgency with nocturia 2 or 3. Present for a few years. No dysuria. No penile discharge. No recent sexual encounters. No testicular pain. No history of prostate problems. Occasional dribbling. No problems getting stream started. #8 he has some impairment of vision in his right eye. 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 He feels like right eye has a sudden impairment in vision worse in the past over the last month. He has a follow-up appointment with his doctor in May. There is been no episode of sudden visual loss, visual scotomata or holes in the visual field and no eye pain. #9 he has a myriad of musculoskeletal complaints and is usually seen in the [facility] as needed  He chooses to avoid all immunizations He is not at risk of falls There is no depression He is an avid reader with no signs of dementia  Review of Systems His review of systems has multiple positives that are documented in the present illness  He also complains of allergy symptoms with occasional feelings of dry. Otherwise the remainder of his 14 point review of systems is negative     Objective:   Physical Exam  Constitutional: He is oriented to person, place, and time. He appears well-developed and well-nourished.  HENT:  Head: Normocephalic and atraumatic.  Right Ear: Hearing, tympanic membrane, external ear and ear canal normal.  Left  Ear: Hearing, tympanic membrane, external ear and ear canal normal.  Nose: Nose normal.  Mouth/Throat: Uvula is midline, oropharynx is clear and moist and mucous membranes are normal.  The auditory canals are occluded by dry flaky wax bilaterally Ear irrigation after Colace is successful only on the left.  Eyes: Conjunctivae, EOM and lids are normal. Pupils are equal, round, and reactive to light. Right eye exhibits no discharge. Left eye exhibits no discharge. No scleral icterus.  Neck: Trachea  normal and normal range of motion. Neck supple. Carotid bruit is not present.  Cardiovascular: Normal rate, regular rhythm, normal heart sounds, intact distal pulses and normal pulses.   No murmur heard. Pulmonary/Chest: Effort normal and breath sounds normal. No respiratory distress. He has no wheezes. He has no rhonchi. He has no rales.  Abdominal: Soft. Normal appearance and bowel sounds are normal. He exhibits no abdominal bruit. There is no tenderness.  Genitourinary:  His rectal exam shows no active hemorrhoids, moderately good sphincter tone, but has leakage of stool which is soft, brown and heme negative The prostate is mildly enlarged, symmetrical, soft without nodules.  Musculoskeletal: Normal range of motion. He exhibits no edema or tenderness.  Lymphadenopathy:       Head (right side): No submental, no submandibular, no tonsillar, no preauricular, no posterior auricular and no occipital adenopathy present.       Head (left side): No submental, no submandibular, no tonsillar, no preauricular, no posterior auricular and no occipital adenopathy present.    He has no cervical adenopathy.  Neurological: He is alert and oriented to person, place, and time. He has normal strength and normal reflexes. No cranial nerve deficit or sensory deficit. Coordination and gait normal.  Skin: Skin is warm, dry and intact. No lesion and no rash noted.  Tick bite granuloma R flank without rash or infection. Multip seb ker back  Psychiatric: He has a normal mood and affect. His speech is normal and behavior is normal. Judgment and thought content normal.  BP 173/92 mmHg  Pulse 71  Temp(Src) 98.4 F (36.9 C)  Resp 18  Ht '5\' 8"'  (1.727 m)  Wt 176 lb (79.833 kg)  BMI 26.77 kg/m2      Assessment & Plan:  PE (physical exam), annual - Plan: Hepatitis C antibody, POCT urinalysis dipstick, POCT Microscopic Urinalysis ([facility])  Essential hypertension - Plan: CBC with Differential/Platelet, Comprehensive  metabolic panel  Aortic stenosis - Plan: Ambulatory referral to Cardiology  Hyperlipidemia - Plan: Lipid panel  Urgency of urination - Plan: PSA  Stool incontinence - Plan: Ambulatory referral to Gastroenterology, POC Hemoccult Bld/Stl (1-Cd Office Dx)  Other fatigue/sleep dysfunction - Plan: CBC with Differential/Platelet, TSH  Cerumen impaction, bilateral---L cleared--start mineral oil twice a week on R and f/u 1 mo to irrigate if needed  Hearing loss, bilateral  Vision loss of right eye--to move appt with Dr Ellie Lunch up!  Abnormal large bowel motility - Plan: POC Hemoccult Bld/Stl (1-Cd Office Dx)  Meds ordered this encounter after discussion of the importance of reducing BP ,130/80  Medications  . lisinopril (PRINIVIL,ZESTRIL) 10 MG tablet    Sig: Take 1 tablet (10 mg total) by mouth daily.    Dispense:  90 tablet    Refill:  3  home BPs--start hct 12.5 in 2 weeks if not <130/80(tho he likely has component of age related systolic HTN) He will send in home BP results  Addendum--3/2 Labs Results for orders placed or performed in visit on 01/16/16  CBC  with Differential/Platelet  Result Value Ref Range   WBC 5.6 4.0 - 10.5 K/uL   RBC 4.79 4.22 - 5.81 MIL/uL   Hemoglobin 15.0 13.0 - 17.0 g/dL   HCT 44.8 39.0 - 52.0 %   MCV 93.5 78.0 - 100.0 fL   MCH 31.3 26.0 - 34.0 pg   MCHC 33.5 30.0 - 36.0 g/dL   RDW 13.4 11.5 - 15.5 %   Platelets 188 150 - 400 K/uL   MPV 10.4 8.6 - 12.4 fL   Neutrophils Relative % 65 43 - 77 %   Neutro Abs 3.6 1.7 - 7.7 K/uL   Lymphocytes Relative 23 12 - 46 %   Lymphs Abs 1.3 0.7 - 4.0 K/uL   Monocytes Relative 11 3 - 12 %   Monocytes Absolute 0.6 0.1 - 1.0 K/uL   Eosinophils Relative 1 0 - 5 %   Eosinophils Absolute 0.1 0.0 - 0.7 K/uL   Basophils Relative 0 0 - 1 %   Basophils Absolute 0.0 0.0 - 0.1 K/uL   Smear Review Criteria for review not met   Comprehensive metabolic panel  Result Value Ref Range   Sodium 137 135 - 146 mmol/L    Potassium 4.2 3.5 - 5.3 mmol/L   Chloride 104 98 - 110 mmol/L   CO2 22 20 - 31 mmol/L   Glucose, Bld 90 65 - 99 mg/dL   BUN 16 7 - 25 mg/dL   Creat 0.98 0.70 - 1.18 mg/dL   Total Bilirubin 0.6 0.2 - 1.2 mg/dL   Alkaline Phosphatase 57 40 - 115 U/L   AST 19 10 - 35 U/L   ALT 20 9 - 46 U/L   Total Protein 7.1 6.1 - 8.1 g/dL   Albumin 4.1 3.6 - 5.1 g/dL   Calcium 9.2 8.6 - 10.3 mg/dL  PSA  Result Value Ref Range   PSA 0.77 <=4.00 ng/mL  Hepatitis C antibody  Result Value Ref Range   HCV Ab NEGATIVE NEGATIVE  TSH  Result Value Ref Range   TSH 0.99 0.40 - 4.50 mIU/L  Lipid panel  Result Value Ref Range   Cholesterol 290 (H) 125 - 200 mg/dL   Triglycerides 191 (H) <150 mg/dL   HDL 85 >=40 mg/dL   Total CHOL/HDL Ratio 3.4 <=5.0 Ratio   VLDL 38 (H) <30 mg/dL   LDL Cholesterol 167 (H) <130 mg/dL  POCT urinalysis dipstick  Result Value Ref Range   Color, UA yellow yellow   Clarity, UA clear clear   Glucose, UA negative negative   Bilirubin, UA negative negative   Ketones, POC UA negative negative   Spec Grav, UA <=1.005    Blood, UA negative negative   pH, UA 6.0    Protein Ur, POC negative negative   Urobilinogen, UA 0.2    Nitrite, UA Negative Negative   Leukocytes, UA Negative Negative  POCT Microscopic Urinalysis ([facility])  Result Value Ref Range   WBC,UR,HPF,POC None None WBC/hpf   RBC,UR,HPF,POC None None RBC/hpf   Bacteria None None, Too numerous to count   Mucus Absent Absent   Epithelial Cells, UR Per Microscopy None None, Too numerous to count cells/hpf  POC Hemoccult Bld/Stl (1-Cd Office Dx)  Result Value Ref Range   Card #1 Date 01/16/2016    Fecal Occult Blood, POC Negative Negative   Will disc lipid therapy

## 2016-01-18 ENCOUNTER — Encounter: Payer: Self-pay | Admitting: Gastroenterology

## 2016-01-21 ENCOUNTER — Ambulatory Visit: Payer: Medicare Other | Admitting: Cardiology

## 2016-01-29 ENCOUNTER — Encounter: Payer: Self-pay | Admitting: Cardiology

## 2016-03-12 ENCOUNTER — Ambulatory Visit (INDEPENDENT_AMBULATORY_CARE_PROVIDER_SITE_OTHER): Payer: Medicare Other | Admitting: Gastroenterology

## 2016-03-12 ENCOUNTER — Encounter: Payer: Self-pay | Admitting: Gastroenterology

## 2016-03-12 VITALS — BP 158/90 | HR 68

## 2016-03-12 DIAGNOSIS — Z1211 Encounter for screening for malignant neoplasm of colon: Secondary | ICD-10-CM

## 2016-03-12 NOTE — Progress Notes (Signed)
HPI :  70 y/o male: here for first time CRC screening, he is new to our office  No prior colonoscopy but he had negative FOBT in March 2017. No anemia. No constipation, he has occasional loose stools. He has some periodic rectal pruritus and issues with hemorrhoids in the past. He has some rare blood on the tissue with wiping but no obvious blood in the stools. He endorsed incontinence to nursing staff at check in and when asked about it, he denied any fecal incontinence or fecal leakage. No abdominal pains that are persistent. No focal pain. No weight loss. Eating well. No vomiting.   No FH of CRC. He takes a regular aspirin for preventative purposes. He otherwise feels well and has no symptoms today. He is concerned that he lives by himself and has no transportation to take him home if he has a colonoscopy.     Past Medical History  Diagnosis Date  . Routine general medical examination at a health care facility   . Hypertension   . Aortic stenosis   . Hyperlipidemia   . Urgency of urination   . Stool incontinence   . Other fatigue   . Cerumen impaction bilaterally  . Hearing loss   . Vision loss of right eye   . Abnormal large bowel motility      Past Surgical History  Procedure Laterality Date  . Eye surgery  1981     Right eye- corneal transplant  . Hernia repair      x3   Family History  Problem Relation Age of Onset  . Heart disease Father   . Hyperlipidemia Father   . Hypertension Mother   . Mental retardation Mother   . Hyperlipidemia Brother   . Hypertension Brother    Social History  Substance Use Topics  . Smoking status: Current Some Day Smoker    Types: Cigars  . Smokeless tobacco: Never Used  . Alcohol Use: Yes   Current Outpatient Prescriptions  Medication Sig Dispense Refill  . aspirin 325 MG tablet Take 325 mg by mouth daily.    . cetirizine (ZYRTEC) 10 MG tablet Take 10 mg by mouth daily.    . fish oil-omega-3 fatty acids 1000 MG capsule Take  2 g by mouth daily.    . Glucosamine 500 MG CAPS Take 1 capsule by mouth daily.     No current facility-administered medications for this visit.   No Known Allergies   Review of Systems: All systems reviewed and negative except where noted in HPI.   Lab Results  Component Value Date   WBC 5.6 01/16/2016   HGB 15.0 01/16/2016   HCT 44.8 01/16/2016   MCV 93.5 01/16/2016   PLT 188 01/16/2016    Lab Results  Component Value Date   CREATININE 0.98 01/16/2016   BUN 16 01/16/2016   NA 137 01/16/2016   K 4.2 01/16/2016   CL 104 01/16/2016   CO2 22 01/16/2016    Lab Results  Component Value Date   ALT 20 01/16/2016   AST 19 01/16/2016   ALKPHOS 57 01/16/2016   BILITOT 0.6 01/16/2016     Physical Exam: BP 158/90 mmHg  Pulse 68 Constitutional: Pleasant,well-developed, male in no acute distress. HEENT: Normocephalic and atraumatic. Conjunctivae are normal. No scleral icterus. Neck supple.  Cardiovascular: Normal rate, regular rhythm.  Pulmonary/chest: Effort normal and breath sounds normal. No wheezing, rales or rhonchi. Abdominal: Soft, nondistended, nontender. Bowel sounds active throughout. There are no masses  palpable. No hepatomegaly. Extremities: no edema Lymphadenopathy: No cervical adenopathy noted. Neurological: Alert and oriented to person place and time. Skin: Skin is warm and dry. No rashes noted. Psychiatric: Normal mood and affect. Behavior is normal.   ASSESSMENT AND PLAN: 70 y/o male here for colon cancer screening. Negative FOBT but no prior colonoscopy. He is asymptomatic but 20 years overdue for a colonoscopy. I discussed options for CRC screening with him to include optical colonoscopy and stool testing, specifically Cologuard. Given he is overdue for screening, the likelihood for a positive Cologuard is higher than other first time screening patients, and may likely be positive. However, he is concerned that he lives by himself without knowing anyone  who would be able to provide transportation home for him after a colonoscopy. Following a discussion of these issues he wished to start with the Cologuard due to transportation issue. If this is positive, he is willing to undergo colonoscopy. I will let him know results of the exam.   Hoot Owl Cellar, MD [facility] Pager [phone]  CC: Leandrew Koyanagi, MD

## 2016-03-12 NOTE — Patient Instructions (Addendum)
You have been given a separate informational sheet regarding your tobacco use, the importance of quitting and local resources to help you quit.  You will receive a phone call from [employer] to initiate your Cologuard

## 2016-03-14 ENCOUNTER — Ambulatory Visit (INDEPENDENT_AMBULATORY_CARE_PROVIDER_SITE_OTHER): Payer: Medicare Other | Admitting: Cardiology

## 2016-03-14 ENCOUNTER — Encounter: Payer: Self-pay | Admitting: Cardiology

## 2016-03-14 VITALS — BP 140/90 | HR 68 | Ht 69.0 in | Wt 175.1 lb

## 2016-03-14 DIAGNOSIS — E782 Mixed hyperlipidemia: Secondary | ICD-10-CM

## 2016-03-14 DIAGNOSIS — I350 Nonrheumatic aortic (valve) stenosis: Secondary | ICD-10-CM

## 2016-03-14 DIAGNOSIS — R030 Elevated blood-pressure reading, without diagnosis of hypertension: Secondary | ICD-10-CM

## 2016-03-14 DIAGNOSIS — R0789 Other chest pain: Secondary | ICD-10-CM

## 2016-03-14 NOTE — Progress Notes (Signed)
[facility]. 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 He was previously seen in 04/14/14 by Dr. Joseph Art for what he says was generalized malaise, feeling poorly overall. As an after thought, he told me about the chest discomfort that he was having, centrally, tightness with no other associated symptoms. He had earwax removed, amoxicillin for sinus infection and shortly after this, he felt better. He has not had any further chest discomfort. His doctor appreciated a significant murmur and wanted to see a cardiologist for this.   He has been quite active. He has a wood furnace at home and splits his own wood. Splitting wood for approximately 8 hours, hydraulic. He had an oak tree fall on his neighbor's yard, was able to use that would. Significantly active.  He has noted some mild shortness of breath with activity, mild sensation of chest pressure not severe. Slight dizziness at times. Back pain. No syncope.  Aortic stenosis was described as mild in 2013, echocardiogram reviewed.   In regards to hypertension, he was started a few years ago on lisinopril but has subsequently taken himself off of this medication. When he went to gastroenterologist, blood pressure was in the 160 range.  Wt Readings from Last 3 Encounters:  03/14/16 175 lb 1.9 oz (79.434 kg)  01/16/16 176 lb (79.833 kg)  07/11/15 172 lb (78.019 kg)     Past Medical History  Diagnosis Date  . Routine general medical examination at a health care facility   . Hypertension   . Aortic stenosis   . Hyperlipidemia   . Urgency of urination   . Stool incontinence   . Other fatigue   . Cerumen impaction bilaterally  . Hearing  loss   . Vision loss of right eye   . Abnormal large bowel motility     Past Surgical History  Procedure Laterality Date  . Eye surgery  1981     Right eye- corneal transplant  . Hernia repair      x3    Current Outpatient Prescriptions  Medication Sig Dispense Refill  . aspirin 325 MG tablet Take 325 mg by mouth daily.    . cetirizine (ZYRTEC) 10 MG tablet Take 10 mg by mouth daily.    . fish oil-omega-3 fatty acids 1000 MG capsule Take 2 g by mouth daily.    . Glucosamine 500 MG CAPS Take 1 capsule by mouth daily.     No current facility-administered medications for this visit.    Allergies:   No Known Allergies  Social History:  The patient  reports that he has been smoking Cigars.  He has never used smokeless tobacco. He reports that he drinks alcohol. He reports that he uses illicit drugs (Marijuana).   Family History  Problem Relation Age of Onset  . Heart disease Father   . Hyperlipidemia Father   . Hypertension Mother   . Mental retardation Mother   . Hyperlipidemia Brother   . Hypertension Brother     ROS:  Please see the history of present illness.   Denies any orthopnea, PND, syncope, rash, strokelike symptoms, fevers.  All other systems reviewed and negative.   PHYSICAL EXAM:  VS:  BP 140/90 mmHg  Pulse 68  Ht 5\' 9"  (1.753 m)  Wt 175 lb 1.9 oz (79.434 kg)  BMI 25.85 kg/m2 Well nourished, well developed, in no acute distress HEENT: normal, [place]/AT, EOMI Neck: no JVD, normal carotid upstroke, no bruit Cardiac:  normal S1, S2; RRR; [id number] systolic murmur right upper sternal border, radiation to the carotids as well as abdomen Lungs:  clear to auscultation bilaterally, no wheezing, rhonchi or rales Abd: soft, nontender, no hepatomegaly, no bruits Ext: no edema, 2+ distal pulses Skin: warm and dry GU: deferred Neuro: no focal abnormalities noted, AAO x 3  EKG:  EKG was ordered today: 03/14/16-sinus rhythm, nonspecific ST-T wave changes, no other obvious  abnormalities personally viewed-prior 04/13/14-sinus rhythm, 68, no obvious abnormalities.  Echocardiogram 05/24/14:  - Left ventricle: The cavity size was normal. Wall thickness was increased in a pattern of mild LVH. There was mild focal basal hypertrophy of the septum. Systolic function was normal. The estimated ejection fraction was in the range of 60% to 65%. Wall motion was normal; there were no regional wall motion abnormalities. Doppler parameters are consistent with abnormal left ventricular relaxation (grade 1 diastolic dysfunction). - Aortic valve: Valve mobility was restricted. There was moderate stenosis. - Mitral valve: There was mild regurgitation. - Left atrium: The atrium was mildly dilated. - Right ventricle: The cavity size was mildly dilated.  Impressions:  - Normal LV function; moderate AS (mean gradient 21 mmHg); mild LAE; mild RVE.  Labs: [id number], basic metabolic profile unremarkable.  ASSESSMENT AND PLAN:  1. Chest pain-resolved previously after sinus infection cleared. He thinks that it may be related to seasonal allergies. He's been very active and not having any further active chest discomfort. I have given him the option of exercise treadmill test. At this point we will watch his symptoms. Obviously if symptoms return, he will let me know and we will go ahead and proceed with testing.  Continue with primary prevention.  Several years ago, he underwent nuclear stress test/exercise treadmill test and was told that he had the second best time on the treadmill behind a varsity high school athlete. Was told he had an abnormally high ejection fraction. 2. Heart murmur-moderate aortic stenosis previously. We will check echocardiogram. 3. Hyperlipidemia-LDL 162 however HDL 72 previously. Currently has fairly good diet, exercise. Not interested in cholesterol medication in the past. 4. Elevated blood pressure without diagnosis of hypertension-if his  blood pressure remains greater than 140/90, I would encourage him to resume lisinopril. BP continue to discuss this with his primary physician. Also decreasing overall alcohol intake can help with this as well. 5. I will followup with echocardiogram. See back in a year. He will call me earlier if symptoms progress.  Signed, Candee Furbish, MD [facility]  03/14/2016 8:45 AM

## 2016-03-14 NOTE — Patient Instructions (Signed)

## 2016-03-28 ENCOUNTER — Other Ambulatory Visit (HOSPITAL_COMMUNITY): Payer: Medicare Other

## 2016-04-01 DIAGNOSIS — Z1211 Encounter for screening for malignant neoplasm of colon: Secondary | ICD-10-CM | POA: Diagnosis not present

## 2016-04-01 DIAGNOSIS — Z1212 Encounter for screening for malignant neoplasm of rectum: Secondary | ICD-10-CM | POA: Diagnosis not present

## 2016-04-02 DIAGNOSIS — H04123 Dry eye syndrome of bilateral lacrimal glands: Secondary | ICD-10-CM | POA: Diagnosis not present

## 2016-04-02 DIAGNOSIS — H18603 Keratoconus, unspecified, bilateral: Secondary | ICD-10-CM | POA: Diagnosis not present

## 2016-04-02 DIAGNOSIS — H52203 Unspecified astigmatism, bilateral: Secondary | ICD-10-CM | POA: Diagnosis not present

## 2016-04-03 ENCOUNTER — Ambulatory Visit (HOSPITAL_COMMUNITY): Payer: Medicare Other | Attending: Cardiology

## 2016-04-03 ENCOUNTER — Other Ambulatory Visit: Payer: Self-pay

## 2016-04-03 DIAGNOSIS — Z720 Tobacco use: Secondary | ICD-10-CM | POA: Insufficient documentation

## 2016-04-03 DIAGNOSIS — I10 Essential (primary) hypertension: Secondary | ICD-10-CM | POA: Diagnosis not present

## 2016-04-03 DIAGNOSIS — E785 Hyperlipidemia, unspecified: Secondary | ICD-10-CM | POA: Diagnosis not present

## 2016-04-03 DIAGNOSIS — I350 Nonrheumatic aortic (valve) stenosis: Secondary | ICD-10-CM | POA: Insufficient documentation

## 2016-04-09 ENCOUNTER — Telehealth: Payer: Self-pay | Admitting: Cardiology

## 2016-04-09 NOTE — Telephone Encounter (Signed)
Message routed to P. Raul Del, RN

## 2016-04-09 NOTE — Telephone Encounter (Signed)
Reviewed results of echo with pt who states understanding. 

## 2016-04-09 NOTE — Telephone Encounter (Signed)
New message     The pt is calling to results Echo done earlier

## 2016-04-15 ENCOUNTER — Other Ambulatory Visit: Payer: Self-pay

## 2016-04-15 ENCOUNTER — Telehealth: Payer: Self-pay | Admitting: *Deleted

## 2016-04-15 LAB — COLOGUARD: Cologuard: POSITIVE

## 2016-04-15 NOTE — Telephone Encounter (Signed)
Received a call from [place] at [employer] that patient has a positive cologuard.

## 2016-04-15 NOTE — Telephone Encounter (Signed)
Charles Robles if he has a (+) Cologuard, please book him for a colonoscopy if he can do it here. He previously had a hard time coordinating transportation. If he is able to do this we can get his exam done at the [facility]. If he has problems with this let me know and we will figure something out. I would prefer he reduce his aspirin from 325mg  to 81mg  one week prior to the procedure. From what I could gather he takes aspirin for preventative purposes but has no indication for the 325mg  dosing. Thanks

## 2016-04-15 NOTE — Telephone Encounter (Signed)
                                                          Patient given results and recommendation. He will call back next week to schedule.

## 2016-04-15 NOTE — Telephone Encounter (Signed)
Left a message for patient to call back. 

## 2016-04-29 ENCOUNTER — Telehealth: Payer: Self-pay | Admitting: *Deleted

## 2016-04-29 NOTE — Telephone Encounter (Signed)
Patient   Charles Gunning, MD at 04/15/2016 11:44 AM     Status: [place] if he has a (+) Cologuard, please book him for a colonoscopy if he can do it here. He previously had a hard time coordinating transportation. If he is able to do this we can get his exam done at the [facility]. If he has problems with this let me know and we will figure something out. I would prefer he reduce his aspirin from 325mg  to 81mg  one week prior to the procedure. From what I could gather he takes aspirin for preventative purposes but has no indication for the 325mg  dosing. Thanks       left a message that he is ready to schedule a colonoscopy. Left a message for him to call back.

## 2016-05-05 NOTE — Telephone Encounter (Signed)
Left a message for patient to call back. 

## 2016-05-06 ENCOUNTER — Other Ambulatory Visit: Payer: Self-pay | Admitting: *Deleted

## 2016-05-06 NOTE — Telephone Encounter (Signed)
Spoke with patient and scheduled pre visit on 05/13/16 at 11:00 AM and prop colon on 05/22/16 at 3:30 PM.

## 2016-05-08 ENCOUNTER — Telehealth: Payer: Self-pay

## 2016-05-08 NOTE — Telephone Encounter (Signed)
Dr Havery Moros,      Marena Chancy of details but "Abnormal large bowel motility" noted under medical hx.  I assume this means slow motility but I do not have any reports (that I can find).  ?2 day prep necessary or not?                                                                                                                                                                                                      Thank you, Angela/PV

## 2016-05-08 NOTE — Telephone Encounter (Signed)
Regular prep is fine. I am not sure what that refers to, he didn't complain of constipation during clinic visit. Thanks

## 2016-05-09 ENCOUNTER — Other Ambulatory Visit: Payer: Self-pay

## 2016-05-13 ENCOUNTER — Ambulatory Visit (AMBULATORY_SURGERY_CENTER): Payer: Self-pay | Admitting: *Deleted

## 2016-05-13 VITALS — Ht 69.0 in | Wt 180.0 lb

## 2016-05-13 DIAGNOSIS — R195 Other fecal abnormalities: Secondary | ICD-10-CM

## 2016-05-13 MED ORDER — NA SULFATE-K SULFATE-MG SULF 17.5-3.13-1.6 GM/177ML PO SOLN: 1.0000 | Freq: Once | ORAL | Status: DC

## 2016-05-13 NOTE — Progress Notes (Signed)
No egg or soy allergy known to patient  No issues with past sedation with any surgeries  or procedures, no intubation problems  No diet pills per patient No home 02 use per patient  No blood thinners per patient  Pt denies issues with constipation  Pt takes 325 mg ASA daily and is to decrease to 81 mg per Dr Havery Moros - pt aware

## 2016-05-22 ENCOUNTER — Encounter: Payer: Self-pay | Admitting: Gastroenterology

## 2016-05-22 ENCOUNTER — Ambulatory Visit (AMBULATORY_SURGERY_CENTER): Payer: Medicare Other | Admitting: Gastroenterology

## 2016-05-22 VITALS — BP 128/83 | HR 66 | Temp 98.6°F | Resp 22 | Ht 69.0 in | Wt 180.0 lb

## 2016-05-22 DIAGNOSIS — R195 Other fecal abnormalities: Secondary | ICD-10-CM

## 2016-05-22 DIAGNOSIS — D120 Benign neoplasm of cecum: Secondary | ICD-10-CM | POA: Diagnosis not present

## 2016-05-22 DIAGNOSIS — D123 Benign neoplasm of transverse colon: Secondary | ICD-10-CM | POA: Diagnosis not present

## 2016-05-22 DIAGNOSIS — K621 Rectal polyp: Secondary | ICD-10-CM

## 2016-05-22 DIAGNOSIS — D122 Benign neoplasm of ascending colon: Secondary | ICD-10-CM

## 2016-05-22 DIAGNOSIS — D129 Benign neoplasm of anus and anal canal: Secondary | ICD-10-CM

## 2016-05-22 DIAGNOSIS — D128 Benign neoplasm of rectum: Secondary | ICD-10-CM

## 2016-05-22 MED ORDER — SODIUM CHLORIDE 0.9 % IV SOLN: 500.0000 mL | INTRAVENOUS | Status: DC

## 2016-05-22 NOTE — Op Note (Signed)
[place] Patient Name: Charles Robles Procedure Date: 05/22/2016 3:08 PM MRN: [id number] Endoscopist: Remo Lipps P. Havery Moros , MD Age: 70 Referring MD:  Date of Birth: 1945/12/31 Gender: Male Account #: [id number] Procedure:                Colonoscopy Indications:              positive Cologuard testing, Screening for malignant                            neoplasm in the colon, This is the patient's first                            colonoscopy Medicines:                Monitored Anesthesia Care Procedure:                Pre-Anesthesia Assessment:                           - Prior to the procedure, a History and Physical                            was performed, and patient medications and                            allergies were reviewed. The patient's tolerance of                            previous anesthesia was also reviewed. The risks                            and benefits of the procedure and the sedation                            options and risks were discussed with the patient.                            All questions were answered, and informed consent                            was obtained. Prior Anticoagulants: The patient has                            taken aspirin, last dose was 1 day prior to                            procedure. ASA Grade Assessment: III - A patient                            with severe systemic disease. After reviewing the                            risks and benefits, the patient was deemed in  satisfactory condition to undergo the procedure.                           After obtaining informed consent, the colonoscope                            was passed under direct vision. Throughout the                            procedure, the patient's blood pressure, pulse, and                            oxygen saturations were monitored continuously. The                            Model CF-HQ190L [id number]) scope  was introduced                            through the anus and advanced to the the terminal                            ileum, with identification of the appendiceal                            orifice and IC valve. The colonoscopy was performed                            without difficulty. The patient tolerated the                            procedure well. The quality of the bowel                            preparation was adequate. The terminal ileum,                            ileocecal valve, appendiceal orifice, and rectum                            were photographed. Scope In: 3:21:17 PM Scope Out: 3:52:21 PM Scope Withdrawal Time: 0 hours 26 minutes 39 seconds  Total Procedure Duration: 0 hours 31 minutes 4 seconds  Findings:                 The perianal and digital rectal examinations were                            normal.                           A localized area of nodular mucosa was found in the                            cecum. I suspect this was benign lymphoid  aggregates but biopsies were taken with a cold                            forceps for histology.                           A 25 mm polyp was found in the ascending colon. The                            polyp was flat. The polyp was removed with a                            piecemeal technique using a cold snare. Resection                            and retrieval were complete. Area was tattooed with                            an injection of Spot (carbon black).                           A 8 mm polyp was found in the ascending colon. The                            polyp was sessile. The polyp was removed with a                            cold snare. Resection and retrieval were complete.                           Three sessile polyps were found in the hepatic                            flexure. The polyps were 3 to 5 mm in size. These                            polyps were removed with a  cold snare. Resection                            and retrieval were complete.                           A 10 mm polyp was found in the transverse colon.                            The polyp was sessile. The polyp was removed with a                            cold snare. Resection and retrieval were complete.                           A 5 mm polyp was found in the  transverse colon. The                            polyp was sessile. The polyp was removed with a                            cold snare. Resection and retrieval were complete.                           A 3 mm polyp was found in the rectum. The polyp was                            sessile. The polyp was removed with a cold snare.                            Resection and retrieval were complete.                           A single medium-mouthed diverticulum was found in                            the sigmoid colon.                           Non-bleeding internal hemorrhoids were found during                            retroflexion.                           The terminal ileum appeared normal.                           The exam was otherwise without abnormality. Complications:            No immediate complications. Estimated blood loss:                            Minimal. Estimated Blood Loss:     Estimated blood loss was minimal. Impression:               - Nodular mucosa in the cecum. Suspect benign                            lymphoid aggregates but biopsied to ensure no                            adenomatous change.                           - One 25 mm polyp in the ascending colon, removed                            piecemeal using a cold snare. Resected and  retrieved. Tattooed.                           - One 8 mm polyp in the ascending colon, removed                            with a cold snare. Resected and retrieved.                           - Three 3 to 5 mm polyps at the hepatic flexure,                             removed with a cold snare. Resected and retrieved.                           - One 10 mm polyp in the transverse colon, removed                            with a cold snare. Resected and retrieved.                           - One 5 mm polyp in the transverse colon, removed                            with a cold snare. Resected and retrieved.                           - One 3 mm polyp in the rectum, removed with a cold                            snare. Resected and retrieved.                           - Diverticulosis in the sigmoid colon.                           - Non-bleeding internal hemorrhoids.                           - The examined portion of the ileum was normal.                           - The examination was otherwise normal. Recommendation:           - Patient has a contact number available for                            emergencies. The signs and symptoms of potential                            delayed complications were discussed with the                            patient. Return to normal  activities tomorrow.                            Written discharge instructions were provided to the                            patient.                           - Resume previous diet.                           - Continue present medications.                           - No ibuprofen, naproxen, or other non-steroidal                            anti-inflammatory drugs for 2 weeks after polyp                            removal.                           - Await pathology results.                           - Repeat colonoscopy is recommended for                            surveillance. I suspect a colonoscopy will be                            warranted in 3-6 months to re-evaluate large                            ascending colon polypectomy site to ensure no                            residual polypoid tissue Remo Lipps P. Armbruster, MD 05/22/2016 4:02:42 PM This report has  been signed electronically.

## 2016-05-22 NOTE — Progress Notes (Signed)
Report to PACU, RN, vss, BBS= Clear.  

## 2016-05-22 NOTE — Progress Notes (Signed)
Called to room to assist during endoscopic procedure.  Patient ID and intended procedure confirmed with present staff. Received instructions for my participation in the procedure from the performing physician.  

## 2016-05-22 NOTE — Patient Instructions (Signed)
YOU HAD AN ENDOSCOPIC PROCEDURE TODAY AT [facility]:   Refer to the procedure report that was given to you for any specific questions about what was found during the examination.  If the procedure report does not answer your questions, please call your gastroenterologist to clarify.  If you requested that your care partner not be given the details of your procedure findings, then the procedure report has been included in a sealed envelope for you to review at your convenience later.  YOU SHOULD EXPECT: Some feelings of bloating in the abdomen. Passage of more gas than usual.  Walking can help get rid of the air that was put into your GI tract during the procedure and reduce the bloating. If you had a lower endoscopy (such as a colonoscopy or flexible sigmoidoscopy) you may notice spotting of blood in your stool or on the toilet paper. If you underwent a bowel prep for your procedure, you may not have a normal bowel movement for a few days.  Please Note:  You might notice some irritation and congestion in your nose or some drainage.  This is from the oxygen used during your procedure.  There is no need for concern and it should clear up in a day or so.  SYMPTOMS TO REPORT IMMEDIATELY:   Following lower endoscopy (colonoscopy or flexible sigmoidoscopy):  Excessive amounts of blood in the stool  Significant tenderness or worsening of abdominal pains  Swelling of the abdomen that is new, acute  Fever of 100F or higher  For urgent or emergent issues, a gastroenterologist can be reached at any hour by calling [phone].  DIET: Your first meal following the procedure should be a small meal and then it is ok to progress to your normal diet. Heavy or fried foods are harder to digest and may make you feel nauseous or bloated.  Likewise, meals heavy in dairy and vegetables can increase bloating.  Drink plenty of fluids but you should avoid alcoholic beverages for 24 hours.  ACTIVITY:   You should plan to take it easy for the rest of today and you should NOT DRIVE or use heavy machinery until tomorrow (because of the sedation medicines used during the test).    FOLLOW UP: Our staff will call the number listed on your records the next business day following your procedure to check on you and address any questions or concerns that you may have regarding the information given to you following your procedure. If we do not reach you, we will leave a message.  However, if you are feeling well and you are not experiencing any problems, there is no need to return our call.  We will assume that you have returned to your regular daily activities without incident.  NO ASPIRIN, ASPIRIN CONTAINING PRODUCTS (GOODY OR BC POWDERS), NSAIDS (ADVIL, ALEVE, IBUPROFEN, MOTRIN) FOR 2 WEEKS; TYLENOL IS OK  Please read over handouts about polyps, diverticulosis, hemorrhoids and high fiber diets  Continue medications  Await pathology  If any biopsies were taken you will be contacted by phone or by letter within the next 1-3 weeks.  Please call us at [phone] if you have not heard about the biopsies in 3 weeks.    SIGNATURES/CONFIDENTIALITY: You and/or your care partner have signed paperwork which will be entered into your electronic medical record.  These signatures attest to the fact that that the information above on your After Visit Summary has been reviewed and is understood.  Full responsibility  of the confidentiality of this discharge information lies with you and/or your care-partner.

## 2016-05-23 ENCOUNTER — Telehealth: Payer: Self-pay

## 2016-05-23 NOTE — Telephone Encounter (Signed)
  Follow up Call-  Call back number 05/22/2016  Post procedure Call Back phone  # [phone]  Permission to leave phone message Yes    Patient was called for follow up after his procedure on 05/22/2016. No answer at the number given for follow up phone call. A message was left on the answering machine.

## 2016-05-28 ENCOUNTER — Encounter: Payer: Self-pay | Admitting: Gastroenterology

## 2016-06-18 ENCOUNTER — Encounter: Payer: Self-pay | Admitting: Gastroenterology

## 2016-07-01 ENCOUNTER — Other Ambulatory Visit: Payer: Self-pay | Admitting: Physician Assistant

## 2016-07-01 DIAGNOSIS — L570 Actinic keratosis: Secondary | ICD-10-CM | POA: Diagnosis not present

## 2016-07-01 DIAGNOSIS — D485 Neoplasm of uncertain behavior of skin: Secondary | ICD-10-CM | POA: Diagnosis not present

## 2016-07-01 DIAGNOSIS — L820 Inflamed seborrheic keratosis: Secondary | ICD-10-CM | POA: Diagnosis not present

## 2016-10-06 ENCOUNTER — Ambulatory Visit (INDEPENDENT_AMBULATORY_CARE_PROVIDER_SITE_OTHER): Payer: Medicare Other | Admitting: Sports Medicine

## 2016-10-06 ENCOUNTER — Encounter: Payer: Self-pay | Admitting: Sports Medicine

## 2016-10-06 DIAGNOSIS — G5751 Tarsal tunnel syndrome, right lower limb: Secondary | ICD-10-CM

## 2016-10-06 DIAGNOSIS — M545 Low back pain, unspecified: Secondary | ICD-10-CM | POA: Insufficient documentation

## 2016-10-06 DIAGNOSIS — G8929 Other chronic pain: Secondary | ICD-10-CM

## 2016-10-06 NOTE — Assessment & Plan Note (Signed)
I think the sister during some of the sharp pain that goes into his right foot  Home exercise program to emphasize back flexion and some limited extension exercises  Heat and over-the-counter medications as needed  Easy stretching  Recheck pending response

## 2016-10-06 NOTE — Progress Notes (Signed)
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 Range of motion is full in all directions. Strength is 5/5 in all directions. Stable lateral and medial ligaments; squeeze test and kleiger test unremarkable; Talar dome nontender; No pain at base of 5th MT; No tenderness over cuboid; No tenderness over N spot or navicular prominence No tenderness on posterior aspects of lateral and medial malleolus No sign of peroneal tendon subluxations; Negative tarsal tunnel tinel's Able to walk 4 steps.  Foot and ankle are nontender to palpation  Straight leg raise bilaterally is tight more so on the right Hip rotation is slightly limited on the right versus the left but both are normal FABER limited on the right Back extension is slightly uncomfortable  Impression I think his intermittent foot pain at this time is now primarily from his tarsal tunnel syndrome. 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

## 2016-10-06 NOTE — Assessment & Plan Note (Signed)
He needs to continue using some arch support in  different shoes  this should lessen his nerve irritation in his foot

## 2017-02-04 ENCOUNTER — Telehealth: Payer: Self-pay

## 2017-02-04 NOTE — Telephone Encounter (Signed)
Pt has a procedure scheduled 03/02/17 for his heart. He wants to wait and schedule colon after this has been performed. He wanted me to tell you thank you. I will send myself a staff message to contact pt a few days after 4/16 to schedule colon.

## 2017-02-04 NOTE — Telephone Encounter (Signed)
-----   Message from Manus Gunning, MD sent at 02/04/2017  8:09 AM EDT ----- Regarding: follow up colonoscopy Caryl Pina, This patient is overdue for follow up colonoscopy. He had a high risk polyp removed last summer, was supposed to have an exam 3-6 months later. Can you please call him and help schedule at his convenience? Can you let me know if you get ahold of him? Thanks

## 2017-02-05 NOTE — Telephone Encounter (Signed)
Okay that sounds fine. Thanks for letting me know

## 2017-02-13 ENCOUNTER — Encounter: Payer: Self-pay | Admitting: *Deleted

## 2017-03-02 ENCOUNTER — Ambulatory Visit (INDEPENDENT_AMBULATORY_CARE_PROVIDER_SITE_OTHER): Payer: Medicare Other | Admitting: Cardiology

## 2017-03-02 ENCOUNTER — Encounter (INDEPENDENT_AMBULATORY_CARE_PROVIDER_SITE_OTHER): Payer: Self-pay

## 2017-03-02 ENCOUNTER — Encounter: Payer: Self-pay | Admitting: Cardiology

## 2017-03-02 VITALS — BP 138/88 | HR 60 | Ht 69.0 in | Wt 174.8 lb

## 2017-03-02 DIAGNOSIS — I350 Nonrheumatic aortic (valve) stenosis: Secondary | ICD-10-CM | POA: Diagnosis not present

## 2017-03-02 DIAGNOSIS — R030 Elevated blood-pressure reading, without diagnosis of hypertension: Secondary | ICD-10-CM

## 2017-03-02 DIAGNOSIS — R0789 Other chest pain: Secondary | ICD-10-CM | POA: Diagnosis not present

## 2017-03-02 NOTE — Progress Notes (Signed)
[facility]. [address], 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   He was previously seen in 04/14/14 by Dr. Joseph Art for what he says was generalized malaise, feeling poorly overall. As an after thought, he told me about the chest discomfort that he was having, centrally, tightness with no other associated symptoms. He had earwax removed, amoxicillin for sinus infection and shortly after this, he felt better. He has not had any further chest discomfort. His doctor appreciated a significant murmur and wanted him to see a cardiologist for this.   He continues to be quite active, and splits wood at home. Every now and then he may feel some upper chest discomfort without exertion that he is tripping to his allergies. Rare. Sometimes walking a pills he may feel mild shortness of breath. No syncope.  Aortic stenosis was described as mild in 2013--is has progressed to moderate  In regards to hypertension, he was started a few years ago on lisinopril but has subsequently taken himself off of this medication.   Once again denies any syncope, bleeding, orthopnea, PND, fevers  Wt Readings from Last 3 Encounters:  03/02/17 174 lb 12.8 oz (79.3 kg)  10/06/16 175 lb (79.4 kg)  05/22/16 180 lb (81.6 kg)     Past Medical History:  Diagnosis Date  . Abnormal large bowel motility   . Allergy   . Aortic stenosis   . Cancer ([place])    melanoma  . Cerumen impaction bilaterally  . Hearing loss   . Hyperlipidemia   . Hypertension   . Other fatigue   . Routine general medical examination at a health care facility   . Stool incontinence   . Urgency of urination   . Vision loss of right eye     Past Surgical  History:  Procedure Laterality Date  . EYE SURGERY  1981    Right eye- corneal transplant  . HERNIA REPAIR     x3    Current Outpatient Prescriptions  Medication Sig Dispense Refill  . aspirin 325 MG tablet Take 325 mg by mouth daily.    . cetirizine (ZYRTEC) 10 MG tablet Take 10 mg by mouth as needed.     . Doxylamine Succinate, Sleep, (SLEEP AID PO) Take 1 tablet by mouth at bedtime as needed.    . fish oil-omega-3 fatty acids 1000 MG capsule Take 2 g by mouth daily.    . Glucosamine 500 MG CAPS Take 1 capsule by mouth daily.     No current facility-administered medications for this visit.     Allergies:   No Known Allergies  Social History:  The patient  reports that he has been smoking Cigars.  He has never used smokeless tobacco. He reports that he drinks about 9.0 oz of alcohol per week . He reports that he uses drugs, including Marijuana.   Family History  Problem Relation Age of Onset  . Heart disease Father   . Hyperlipidemia Father   . Hypertension Mother   . Mental retardation Mother   . Hyperlipidemia Brother   . Hypertension Brother   . Colon cancer Neg Hx   . Colon polyps Neg Hx   .  Rectal cancer Neg Hx   . Stomach cancer Neg Hx   . Esophageal cancer Neg Hx     ROS:  Unless stated in history of present illness above, all other review of systems negative  PHYSICAL EXAM: VS:  BP 138/88   Pulse 60   Ht 5\' 9"  (1.753 m)   Wt 174 lb 12.8 oz (79.3 kg)   BMI 25.81 kg/m  GEN: Well nourished, well developed, in no acute distress  HEENT: normal  Neck: no JVD, radiation of aortic stenosis murmur to carotid, or masses Cardiac: RRR; 3/6 SEM, no rubs, or gallops,no edema  Respiratory:  clear to auscultation bilaterally, normal work of breathing GI: soft, nontender, nondistended, + BS MS: no deformity or atrophy  Skin: warm and dry, no rash Neuro:  Alert and Oriented x 3, Strength and sensation are intact Psych: euthymic mood, full affect   EKG:  EKG was  ordered today: 03/02/17-sinus rhythm, nonspecific ST-T wave changes personally viewed-prior 03/14/16-sinus rhythm, nonspecific ST-T wave changes, no other obvious abnormalities personally viewed-prior 04/13/14-sinus rhythm, 68, no obvious abnormalities.  Echocardiogram 04/03/16  - Left ventricle: The cavity size was normal. There was mild focal   basal hypertrophy of the septum. Systolic function was normal.   The estimated ejection fraction was in the range of 55% to 60%.   Wall motion was normal; there were no regional wall motion   abnormalities. Doppler parameters are consistent with abnormal   left ventricular relaxation (grade 1 diastolic dysfunction). - Aortic valve: Trileaflet; moderately thickened, moderately   calcified leaflets. Valve mobility was restricted. There was   moderate stenosis. Peak velocity (S): 319 cm/s. Mean gradient   (S): 23 mm Hg. - Right ventricle: The cavity size was mildly dilated. Wall   thickness was normal. - Pulmonary arteries: Systolic pressure was mildly increased. PA   peak pressure: 32 mm Hg (S).  Impressions:  - When compared to prior, aortic stenosis has mildly advanced   (prior mean 19mmHg).  Labs: 0/24/09-BDZ, basic metabolic profile unremarkable.  ASSESSMENT AND PLAN:  Moderate aortic stenosis  - Last echocardiogram 03/2017 demonstrated moderate aortic stenosis with slight advancement. We will check echo gram again in May.  - No high-risk symptoms currently.  Atypical chest pain  - Occasional upper chest wall discomfort which he is a traveling to his allergies. This is not brought on by exertional activity. We will continue to follow.  Hyperlipidemia  - LDL 160 range. Not interested in statin therapy.  - Diet, exercise.  Elevated blood pressure previously without diagnosis of hypertension  - Blood pressure better today. Continue to monitor.  I've asked him to reestablish with primary care physician. He was seen at [facility]  previously.  See back in a year. He will call me earlier if symptoms progress.  Signed, Candee Furbish, MD [facility]  03/02/2017 8:49 AM

## 2017-03-02 NOTE — Patient Instructions (Signed)
Medication Instructions:  The current medical regimen is effective;  continue present plan and medications.  Testing/Procedures: Your physician has requested that you have an echocardiogram (after 04/03/17). Echocardiography is a painless test that uses sound waves to create images of your heart. It provides your doctor with information about the size and shape of your heart and how well your heart's chambers and valves are working. This procedure takes approximately one hour. There are no restrictions for this procedure.  Follow-Up: Follow up in 1 year with Dr. Marlou Porch.  You will receive a letter in the mail 2 months before you are due.  Please call us when you receive this letter to schedule your follow up appointment.  If you need a refill on your cardiac medications before your next appointment, please call your pharmacy.  Thank you for choosing [facility]!!

## 2017-03-11 ENCOUNTER — Ambulatory Visit (INDEPENDENT_AMBULATORY_CARE_PROVIDER_SITE_OTHER): Payer: Medicare Other | Admitting: Family Medicine

## 2017-03-11 ENCOUNTER — Encounter: Payer: Self-pay | Admitting: Family Medicine

## 2017-03-11 VITALS — BP 167/69 | HR 71 | Temp 97.9°F | Resp 16 | Ht 69.0 in | Wt 171.6 lb

## 2017-03-11 DIAGNOSIS — R3915 Urgency of urination: Secondary | ICD-10-CM | POA: Diagnosis not present

## 2017-03-11 DIAGNOSIS — W57XXXA Bitten or stung by nonvenomous insect and other nonvenomous arthropods, initial encounter: Secondary | ICD-10-CM | POA: Diagnosis not present

## 2017-03-11 DIAGNOSIS — I350 Nonrheumatic aortic (valve) stenosis: Secondary | ICD-10-CM | POA: Diagnosis not present

## 2017-03-11 DIAGNOSIS — H6123 Impacted cerumen, bilateral: Secondary | ICD-10-CM | POA: Diagnosis not present

## 2017-03-11 DIAGNOSIS — Z125 Encounter for screening for malignant neoplasm of prostate: Secondary | ICD-10-CM | POA: Diagnosis not present

## 2017-03-11 DIAGNOSIS — E785 Hyperlipidemia, unspecified: Secondary | ICD-10-CM | POA: Diagnosis not present

## 2017-03-11 DIAGNOSIS — Z0000 Encounter for general adult medical examination without abnormal findings: Secondary | ICD-10-CM | POA: Diagnosis not present

## 2017-03-11 MED ORDER — CARBAMIDE PEROXIDE 6.5 % OT SOLN: 5.0000 [drp] | Freq: Two times a day (BID) | OTIC | 0 refills | Status: DC

## 2017-03-11 MED ORDER — FLUTICASONE PROPIONATE 50 MCG/ACT NA SUSP: 2.0000 | Freq: Every day | NASAL | 6 refills | Status: DC

## 2017-03-11 NOTE — Progress Notes (Deleted)
Chief Complaint  Patient presents with  . Annual Exam    Subjective:  Charles Robles is a 71 y.o. male here for a health maintenance visit.  Patient is established pt  Tick bite Pt reports that he has been having an itch on his right knee that he observed a spot and pulled off a tick two week ago. He noted that he also had an itchy penis and pulled off a tick last night.    Patient Active Problem List   Diagnosis Date Noted  . Low back pain 10/06/2016  . Hypertension   . Hyperlipidemia 01/17/2016  . Tarsal tunnel syndrome of right side 07/11/2015  . Rotator cuff tear arthropathy of right shoulder 08/29/2014  . Arthritis of right acromioclavicular joint 08/29/2014  . Aortic stenosis 05/23/2014  . Heart murmur 05/23/2014  . 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 Pain in joint, ankle and foot 02/15/2014  . Right hip pain 02/03/2013  . HTN (hypertension) 02/12/2012  . Neck pain on right side 02/11/2012  . Knee pain, left 10/29/2011  . Tear of lateral meniscus of knee joint 10/29/2011    Past Medical History:  Diagnosis Date  . Abnormal large bowel motility   . Allergy   . Aortic stenosis   . Cancer ([place])    melanoma  . Cerumen impaction bilaterally  . Hearing loss   . Hyperlipidemia   . Hypertension   . Other fatigue   . Routine general medical examination at a health care facility   . Stool incontinence   . Urgency of urination   . Vision loss of right eye     Past Surgical History:  Procedure Laterality Date  . EYE SURGERY  1981    Right eye- corneal transplant  . HERNIA REPAIR     x3     Outpatient Medications Prior to Visit  Medication Sig Dispense Refill  . aspirin 325 MG tablet Take 325 mg by mouth daily.    . cetirizine (ZYRTEC) 10 MG tablet Take 10 mg by mouth as needed.     . Doxylamine Succinate, Sleep, (SLEEP AID PO) Take 1 tablet by mouth at bedtime as needed.    . fish oil-omega-3 fatty acids  1000 MG capsule Take 2 g by mouth daily.    . Glucosamine 500 MG CAPS Take 1 capsule by mouth daily.     No facility-administered medications prior to visit.     No Known Allergies   Family History  Problem Relation Age of Onset  . Heart disease Father   . Hyperlipidemia Father   . Hypertension Mother   . Mental retardation Mother   . Hyperlipidemia Brother   . Hypertension Brother   . Colon cancer Neg Hx   . Colon polyps Neg Hx   . Rectal cancer Neg Hx   . Stomach cancer Neg Hx   . Esophageal cancer Neg Hx      Health Habits: Dental Exam: up to date Eye Exam: up to date Exercise: *** times/week on average Current exercise activities: walking/running Diet:   Social History   Social History  . Marital status: Divorced    Spouse name: N/A  . Number of children: N/A  . Years of education: N/A   Occupational History  . Retired    Social History Main Topics  . Smoking status: Current Some Day Smoker    Types: Cigars  . Smokeless tobacco: Never Used     Comment: occ  pipe  . Alcohol use 9.0 oz/week    15 Standard drinks or equivalent per week  . Drug use: Yes    Types: Marijuana     Comment: marijuana  . Sexual activity: No   Other Topics Concern  . Not on file   Social History Narrative  . No narrative on file   History  Alcohol Use  . 9.0 oz/week  . 15 Standard drinks or equivalent per week   History  Smoking Status  . Current Some Day Smoker  . Types: Cigars  Smokeless Tobacco  . Never Used    Comment: occ pipe   History  Drug Use  . Types: Marijuana    Comment: marijuana    GYN: Sexual Health Menstrual status: regular menses LMP: No LMP for male patient. Last pap smear: see HM section History of abnormal pap smears:  Sexually active: ** with ** partner Current contraception:   Health Maintenance: See under health Maintenance activity for review of completion dates as well.  There is no immunization history on file for this  patient.    Depression Screen-PHQ2/9 Depression screen [facility] 2/9 03/11/2017 10/06/2016 01/16/2016 05/22/2015 11/22/2014  Decreased Interest 0 0 0 0 0  Down, Depressed, Hopeless 0 0 0 0 0  PHQ - 2 Score 0 0 0 0 0  Altered sleeping - - - - -  Tired, decreased energy - - - - -  Change in appetite - - - - -  Feeling bad or failure about yourself  - - - - -  Trouble concentrating - - - - -  Moving slowly or fidgety/restless - - - - -  Suicidal thoughts - - - - -  PHQ-9 Score - - - - -       Depression Severity and Treatment Recommendations:  0-4= None  5-9= Mild / Treatment: Support, educate to call if worse; return in one month  10-14= Moderate / Treatment: Support, watchful waiting; Antidepressant or Psycotherapy  15-19= Moderately severe / Treatment: Antidepressant OR Psychotherapy  >= 20 = Major depression, severe / Antidepressant AND Psychotherapy    Review of Systems   ROS  See HPI for ROS as well.    Objective:   Vitals:   03/11/17 1028  BP: (!) 167/69  Pulse: 71  Resp: 16  Temp: 97.9 F (36.6 C)  TempSrc: Oral  SpO2: 100%  Weight: 171 lb 9.6 oz (77.8 kg)  Height: 5\' 9"  (1.753 m)    Body mass index is 25.34 kg/m.  Physical Exam     Assessment/Plan:   Patient was seen for a health maintenance exam.  Counseled the patient on health maintenance issues. Reviewed her health mainteance schedule and ordered appropriate tests (see orders.) Counseled on regular exercise and weight management. Recommend regular eye exams and dental cleaning.   The following issues were addressed today for health maintenance:   There are no diagnoses linked to this encounter.  No Follow-up on file.    Body mass index is 25.34 kg/m.:  Discussed the patient's BMI with patient. The BMI body mass index is 25.34 kg/m.     Future Appointments Date Time Provider [place]  04/09/2017 7:30 AM [id number]    Patient Instructions        IF you received an x-ray today, you will receive an invoice from [facility]. Please contact [facility] at [phone] with questions or concerns regarding your invoice.   IF you received labwork today,  you will receive an invoice from [facility]. Please contact [facility] at [phone] with questions or concerns regarding your invoice.   Our billing staff will not be able to assist you with questions regarding bills from these companies.  You will be contacted with the lab results as soon as they are available. The fastest way to get your results is to activate your My Chart account. Instructions are located on the last page of this paperwork. If you have not heard from [place] regarding the results in 2 weeks, please contact this office.

## 2017-03-11 NOTE — Patient Instructions (Addendum)
IF you received an x-ray today, you will receive an invoice from [facility]. Please contact [facility] at [phone] with questions or concerns regarding your invoice.   IF you received labwork today, you will receive an invoice from [facility]. Please contact [facility] at [phone] with questions or concerns regarding your invoice.   Our billing staff will not be able to assist you with questions regarding bills from these companies.  You will be contacted with the lab results as soon as they are available. The fastest way to get your results is to activate your My Chart account. Instructions are located on the last page of this paperwork. If you have not heard from [place] regarding the results in 2 weeks, please contact this office.      Tick Bite Information, Adult Ticks are insects that draw blood for food. Most ticks live in shrubs and grassy areas. They climb onto people and animals that brush against the leaves and grasses that they rest on. Then they bite, attaching themselves to the skin. Most ticks are harmless, but some ticks carry germs that can spread to a person through a bite and cause a disease. To reduce your risk of getting a disease from a tick bite, it is important to take steps to prevent tick bites. It is also important to check for ticks after being outdoors. If you find that a tick has attached to you, watch for symptoms of disease. How can I prevent tick bites? Take these steps to help prevent tick bites when you are outdoors in an area where ticks are found:  Use insect repellent that has DEET (20% or higher), picaridin, or IR3535 in it. Use it on:  Skin that is showing.  The top of your boots.  Your pant legs.  Your sleeve cuffs.  For repellent products that contain permethrin, follow product instructions. Use these products on:  Clothing.  Gear.  Boots.  Tents.  Wear protective clothing. Long sleeves and long pants offer the  best protection from ticks.  Wear light-colored clothing so you can see ticks more easily.  Tuck your pant legs into your socks.  If you go walking on a trail, stay in the middle of the trail so your skin, hair, and clothing do not touch the bushes.  Avoid walking through areas with long grass.  Check for ticks on your clothing, hair, and skin often while you are outside, and check again before you go inside. Make sure to check the places that ticks attach themselves most often. These places include the scalp, neck, armpits, waist, groin, and joint areas. Ticks that carry a disease called Lyme disease have to be attached to the skin for 24-48 hours. Checking for ticks every day will lessen your risk of this and other diseases.  When you come indoors, wash your clothes and take a shower or a bath right away. Dry your clothes in a dryer on high heat for at least 60 minutes. This will kill any ticks in your clothes. What is the proper way to remove a tick? If you find a tick on your body, remove it as soon as possible. Removing a tick sooner rather than later can prevent germs from passing from the tick to your body. To remove a tick that is crawling on your skin but has not bitten:  Go outdoors and brush the tick off.  Remove the tick with tape or a lint roller. To remove a tick that is  attached to your skin:  Wash your hands.  If you have latex gloves, put them on.  Use tweezers, curved forceps, or a tick-removal tool to gently grasp the tick as close to your skin and the tick's head as possible.  Gently pull with steady, upward pressure until the tick lets go. When removing the tick:  Take care to keep the tick's head attached to its body.  Do not twist or jerk the tick. This can make the tick's head or mouth break off.  Do not squeeze or crush the tick's body. This could force disease-carrying fluids from the tick into your body. Do not try to remove a tick with heat, alcohol,  petroleum jelly, or fingernail polish. Using these methods can cause the tick to salivate and regurgitate into your bloodstream, increasing your risk of getting a disease. What should I do after removing a tick?  Clean the bite area with soap and water, rubbing alcohol, or an iodine scrub.  If an antiseptic cream or ointment is available, apply a small amount to the bite site.  Wash and disinfect any instruments that you used to remove the tick. How should I dispose of a tick? To dispose of a live tick, use one of these methods:  Place it in rubbing alcohol.  Place it in a sealed bag or container.  Wrap it tightly in tape.  Flush it down the toilet. Contact a health care provider if:  You have symptoms of a disease after a tick bite. Symptoms of a tick-borne disease can occur from moments after the tick bites to up to 30 days after a tick is removed. Symptoms include:  Muscle, joint, or bone pain.  Difficulty walking or moving your legs.  Numbness in the legs.  Paralysis.  Red rash around the tick bite area that is shaped like a target or a "bull's-eye."  Redness and swelling in the area of the tick bite.  Fever.  Repeated vomiting.  Diarrhea.  Weight loss.  Tender, swollen lymph glands.  Shortness of breath.  Cough.  Pain in the abdomen.  Headache.  Abnormal tiredness.  A change in your level of consciousness.  Confusion. Get help right away if:  You are not able to remove a tick.  A part of a tick breaks off and gets stuck in your skin.  Your symptoms get worse. Summary  Ticks may carry germs that can spread to a person through a bite and cause disease.  Wear protective clothing and use insect repellent to prevent tick bites. Follow product instructions.  If you find a tick on your body, remove it as soon as possible. If the tick is attached, do not try to remove with heat, alcohol, petroleum jelly, or fingernail polish.  Remove the attached  tick using tweezers, curved forceps, or a tick-removal tool. Gently pull with steady, upward pressure until the tick lets go. Do not twist or jerk the tick. Do not squeeze or crush the tick's body.  If you have symptoms after being bitten by a tick, contact a health care provider. This information is not intended to replace advice given to you by your health care provider. Make sure you discuss any questions you have with your health care provider. Document Released: 10/31/2000 Document Revised: 08/15/2016 Document Reviewed: 08/15/2016 Elsevier Interactive Patient Education  2017 [employer].

## 2017-03-12 ENCOUNTER — Other Ambulatory Visit (HOSPITAL_COMMUNITY): Payer: Medicare Other

## 2017-03-12 LAB — CBC WITH DIFFERENTIAL/PLATELET
Basophils Absolute: 0 10*3/uL (ref 0.0–0.2)
Basos: 0 %
EOS (ABSOLUTE): 0.1 10*3/uL (ref 0.0–0.4)
Eos: 2 %
HEMOGLOBIN: 15.8 g/dL (ref 13.0–17.7)
Hematocrit: 47.6 % (ref 37.5–51.0)
IMMATURE GRANS (ABS): 0 10*3/uL (ref 0.0–0.1)
Immature Granulocytes: 0 %
LYMPHS: 33 %
Lymphocytes Absolute: 1.7 10*3/uL (ref 0.7–3.1)
MCH: 31.2 pg (ref 26.6–33.0)
MCHC: 33.2 g/dL (ref 31.5–35.7)
MCV: 94 fL (ref 79–97)
MONOCYTES: 11 %
Monocytes Absolute: 0.6 10*3/uL (ref 0.1–0.9)
Neutrophils Absolute: 2.8 10*3/uL (ref 1.4–7.0)
Neutrophils: 54 %
PLATELETS: 204 10*3/uL (ref 150–379)
RBC: 5.06 x10E6/uL (ref 4.14–5.80)
RDW: 13.5 % (ref 12.3–15.4)
WBC: 5.3 10*3/uL (ref 3.4–10.8)

## 2017-03-12 LAB — COMPREHENSIVE METABOLIC PANEL
ALK PHOS: 59 IU/L (ref 39–117)
ALT: 18 IU/L (ref 0–44)
AST: 18 IU/L (ref 0–40)
Albumin/Globulin Ratio: 1.6 (ref 1.2–2.2)
Albumin: 4.5 g/dL (ref 3.5–4.8)
BUN / CREAT RATIO: 17 (ref 10–24)
BUN: 16 mg/dL (ref 8–27)
Bilirubin Total: 0.5 mg/dL (ref 0.0–1.2)
CALCIUM: 9.6 mg/dL (ref 8.6–10.2)
CHLORIDE: 101 mmol/L (ref 96–106)
CO2: 26 mmol/L (ref 18–29)
CREATININE: 0.96 mg/dL (ref 0.76–1.27)
GFR calc Af Amer: 92 mL/min/{1.73_m2} (ref >59)
GFR calc non Af Amer: 79 mL/min/{1.73_m2} (ref >59)
Globulin, Total: 2.9 g/dL (ref 1.5–4.5)
Glucose: 100 mg/dL — ABNORMAL HIGH (ref 65–99)
POTASSIUM: 5.2 mmol/L (ref 3.5–5.2)
Sodium: 141 mmol/L (ref 134–144)
TOTAL PROTEIN: 7.4 g/dL (ref 6.0–8.5)

## 2017-03-12 LAB — LIPID PANEL
Chol/HDL Ratio: 3.5 ratio (ref 0.0–5.0)
Cholesterol, Total: 306 mg/dL — ABNORMAL HIGH (ref 100–199)
HDL: 87 mg/dL (ref >39)
LDL Calculated: 187 mg/dL — ABNORMAL HIGH (ref 0–99)
Triglycerides: 161 mg/dL — ABNORMAL HIGH (ref 0–149)
VLDL CHOLESTEROL CAL: 32 mg/dL (ref 5–40)

## 2017-03-12 LAB — PSA: Prostate Specific Ag, Serum: 2.3 ng/mL (ref 0.0–4.0)

## 2017-03-15 NOTE — Progress Notes (Signed)
QUICK REFERENCE INFORMATION: The ABCs of Providing the Annual Wellness Visit  CMS.gov Medicare Learning Network  [employer] Annual Wellness Visit  Subjective:   Charles Robles is a 71 y.o. Male who presents for an Annual Wellness Visit.  Tick bite Pt reports that he has been having an itch on his right knee so he checked and saw a spot. He picked at it and realized it was a tick. He pulled it off two weeks ago. He noted that he had itchy penis and check and saw another tick that he pulled off last night.  Patient Active Problem List   Diagnosis Date Noted  . Low back pain 10/06/2016  . Hypertension   . Hyperlipidemia 01/17/2016  . Tarsal tunnel syndrome of right side 07/11/2015  . Rotator cuff tear arthropathy of right shoulder 08/29/2014  . Arthritis of right acromioclavicular joint 08/29/2014  . Aortic stenosis 05/23/2014  . Heart murmur 05/23/2014  . 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 Pain in joint, ankle and foot 02/15/2014  . Right hip pain 02/03/2013  . HTN (hypertension) 02/12/2012  . Neck pain on right side 02/11/2012  . Knee pain, left 10/29/2011  . Tear of lateral meniscus of knee joint 10/29/2011    Past Medical History:  Diagnosis Date  . Abnormal large bowel motility   . Allergy   . Aortic stenosis   . Cancer ([place])    melanoma  . Cerumen impaction bilaterally  . Hearing loss   . Hyperlipidemia   . Hypertension   . Other fatigue   . Routine general medical examination at a health care facility   . Stool incontinence   . Urgency of urination   . Vision loss of right eye      Past Surgical History:  Procedure Laterality Date  . EYE SURGERY  1981    Right eye- corneal transplant  . HERNIA REPAIR     x3     Outpatient Medications Prior to Visit  Medication Sig Dispense Refill  . aspirin 325 MG tablet Take 325 mg by mouth daily.    . cetirizine (ZYRTEC) 10 MG tablet Take 10 mg by mouth as needed.     .  Doxylamine Succinate, Sleep, (SLEEP AID PO) Take 1 tablet by mouth at bedtime as needed.    . fish oil-omega-3 fatty acids 1000 MG capsule Take 2 g by mouth daily.    . Glucosamine 500 MG CAPS Take 1 capsule by mouth daily.     No facility-administered medications prior to visit.     No Known Allergies   Family History  Problem Relation Age of Onset  . Heart disease Father   . Hyperlipidemia Father   . Hypertension Mother   . Mental retardation Mother   . Hyperlipidemia Brother   . Hypertension Brother   . Colon cancer Neg Hx   . Colon polyps Neg Hx   . Rectal cancer Neg Hx   . Stomach cancer Neg Hx   . Esophageal cancer Neg Hx      Social History   Social History  . Marital status: Divorced    Spouse name: N/A  . Number of children: N/A  . Years of education: N/A   Occupational History  . Retired    Social History Main Topics  . Smoking status: Current Some Day Smoker    Types: Cigars  . Smokeless tobacco: Never Used     Comment: occ pipe  . Alcohol use 9.0  oz/week    15 Standard drinks or equivalent per week  . Drug use: Yes    Types: Marijuana     Comment: marijuana  . Sexual activity: No   Other Topics Concern  . None   Social History Narrative  . None      Recent Hospitalizations? no  Health Habits: Current exercise activities include: none Exercise: 0 times/week. Diet: in general, a "healthy" diet    Alcohol intake: none  Health Risk Assessment: The patient has completed a Health Risk Assessment. This has been reveiwed with them and has been scanned into the [facility] system as an attached document.  Current Medical Providers and Suppliers: [facility] Patient Care Team: Forrest Moron, MD as PCP - General (Internal Medicine) Future Appointments Date Time Provider [place]  03/18/2017 4:20 PM Forrest Moron, MD PCP-PCP [facility]  04/09/2017 7:30 AM [facility] 2 [facility]     Age-appropriate Screening Schedule: The list  below includes current immunization status and future screening recommendations based on patient's age. Orders for these recommended tests are listed in the plan section. The patient has been provided with a written plan.  There is no immunization history on file for this patient.   Depression Screen-PHQ2/9 completed today  Depression screen [facility] 2/9 03/11/2017 10/06/2016 01/16/2016 05/22/2015 11/22/2014  Decreased Interest 0 0 0 0 0  Down, Depressed, Hopeless 0 0 0 0 0  PHQ - 2 Score 0 0 0 0 0  Altered sleeping - - - - -  Tired, decreased energy - - - - -  Change in appetite - - - - -  Feeling bad or failure about yourself  - - - - -  Trouble concentrating - - - - -  Moving slowly or fidgety/restless - - - - -  Suicidal thoughts - - - - -  PHQ-9 Score - - - - -      Depression Severity and Treatment Recommendations:  0-4= None  5-9= Mild / Treatment: Support, educate to call if worse; return in one month  10-14= Moderate / Treatment: Support, watchful waiting; Antidepressant or Psycotherapy  15-19= Moderately severe / Treatment: Antidepressant OR Psychotherapy  >= 20 = Major depression, severe / Antidepressant AND Psychotherapy  Functional Status Survey: Is the patient deaf or have difficulty hearing?: Yes Does the patient have difficulty seeing, even when wearing glasses/contacts?: No Does the patient have difficulty concentrating, remembering, or making decisions?: No Does the patient have difficulty walking or climbing stairs?: No Does the patient have difficulty dressing or bathing?: No Does the patient have difficulty doing errands alone such as visiting a doctor's office or shopping?: No   Advanced Care Planning: 1. Patient has executed an Advance Directive: no  2. If no, patient was given the opportunity to execute an Advance Directive today?no 3. Are the patient's advanced directives in [place]? n/a 4. This patient has the ability to prepare an Advance Directive:  yes 5. Provider is willing to follow the patient's wishes: yes  Cognitive Assessment: Does the patient have evidence of cognitive impairment? no The patient does not have any evidence of any cognitive problems and denies any  change in mood/affect, appearance, speech, memory or motor skills.  Review of Systems  Constitutional: Negative for chills and fever.  Eyes: Negative for blurred vision, double vision and photophobia.  Respiratory: Negative for cough, hemoptysis, shortness of breath and wheezing.   Cardiovascular: Negative for chest pain and palpitations.  Gastrointestinal: Negative for abdominal pain, nausea  and vomiting.  Genitourinary: Negative for dysuria, frequency and urgency.  Musculoskeletal: Negative for joint pain and myalgias.  Skin: Negative for itching and rash.  Neurological: Negative for dizziness, tingling, tremors, focal weakness and headaches.  Psychiatric/Behavioral: Negative for depression. The patient is not nervous/anxious.      Objective:   Vitals:   03/11/17 1028  BP: (!) 167/69  Pulse: 71  Resp: 16  Temp: 97.9 F (36.6 C)  TempSrc: Oral  SpO2: 100%  Weight: 171 lb 9.6 oz (77.8 kg)  Height: 5\' 9"  (1.753 m)    Body mass index is 25.34 kg/m.   Physical Exam  Constitutional: He appears well-developed and well-nourished.  HENT:  Head: Normocephalic and atraumatic.  Mouth/Throat: Oropharyngeal exudate present.  Bilateral cerumen impaction Excessive ear in the ear canal bilaterally  Eyes: Conjunctivae and EOM are normal. Pupils are equal, round, and reactive to light.  Neck: Normal range of motion. Neck supple.  Cardiovascular: Normal rate and regular rhythm.   Murmur heard. Pulmonary/Chest: Effort normal and breath sounds normal. No respiratory distress.  Abdominal: Soft. Bowel sounds are normal. He exhibits no distension. There is no tenderness. There is no rebound. Hernia confirmed negative in the right inguinal area and confirmed negative  in the left inguinal area.  Genitourinary: Testes normal and penis normal. Cremasteric reflex is present. Right testis shows no mass and no tenderness. Left testis shows no mass and no tenderness. No penile tenderness.  Genitourinary Comments: Chaperon was present  Lymphadenopathy:       Right: No inguinal adenopathy present.       Left: No inguinal adenopathy present.  Psychiatric: He has a normal mood and affect. His behavior is normal. Thought content normal.     Assessment/Plan:   Patient Self-Management and Personalized Health Advice The patient has been provided with information about:  Tick borne infections Hearing screens  During the course of the visit the patient was educated and counseled about appropriate screening and preventive services including:  See orders  Body mass index is 25.34 kg/m. Discussed the patient's BMI with him. The BMI BMI is in the acceptable range  Bentzion was seen today for annual exam.  Diagnoses and all orders for this visit: Medicare annual wellness visit, subsequent -     Comprehensive metabolic panel  Encounter for screening for malignant neoplasm of prostate- reviewed previous PSA, will check PSA today -     PSA  Dyslipidemia- will check levels -     Lipid panel  Tick bite, initial encounter- advised pt to monitor symptoms, penis without lesions or rash, ticket did not feed, will monitor  Nonrheumatic aortic valve stenosis- will check for appropriate hemoglobin levels -     CBC with Differential/Platelet  Urgency of urination- will check psa, no known family history -     PSA  Impacted cerumen of both ears- will try debrox to soften wax Pt has a lot of hairs inside ear canal   Other orders -     fluticasone (FLONASE) 50 MCG/ACT nasal spray; Place 2 sprays into both nostrils daily. -     carbamide peroxide (DEBROX) 6.5 % otic solution; Place 5 drops into both ears 2 (two) times daily.      No Follow-up on file.  Future  Appointments Date Time Provider [place]  03/18/2017 4:20 PM Forrest Moron, MD PCP-PCP [facility]  04/09/2017 7:30 AM [facility]-CV [facility] ECHO 2 [facility]    Patient Instructions       IF  you received an x-ray today, you will receive an invoice from [facility]. Please contact [facility] at [phone] with questions or concerns regarding your invoice.   IF you received labwork today, you will receive an invoice from [facility]. Please contact [facility] at [phone] with questions or concerns regarding your invoice.   Our billing staff will not be able to assist you with questions regarding bills from these companies.  You will be contacted with the lab results as soon as they are available. The fastest way to get your results is to activate your My Chart account. Instructions are located on the last page of this paperwork. If you have not heard from [place] regarding the results in 2 weeks, please contact this office.      Tick Bite Information, Adult Ticks are insects that draw blood for food. Most ticks live in shrubs and grassy areas. They climb onto people and animals that brush against the leaves and grasses that they rest on. Then they bite, attaching themselves to the skin. Most ticks are harmless, but some ticks carry germs that can spread to a person through a bite and cause a disease. To reduce your risk of getting a disease from a tick bite, it is important to take steps to prevent tick bites. It is also important to check for ticks after being outdoors. If you find that a tick has attached to you, watch for symptoms of disease. How can I prevent tick bites? Take these steps to help prevent tick bites when you are outdoors in an area where ticks are found:  Use insect repellent that has DEET (20% or higher), picaridin, or IR3535 in it. Use it on:  Skin that is showing.  The top of your boots.  Your pant legs.  Your sleeve cuffs.  For repellent  products that contain permethrin, follow product instructions. Use these products on:  Clothing.  Gear.  Boots.  Tents.  Wear protective clothing. Long sleeves and long pants offer the best protection from ticks.  Wear light-colored clothing so you can see ticks more easily.  Tuck your pant legs into your socks.  If you go walking on a trail, stay in the middle of the trail so your skin, hair, and clothing do not touch the bushes.  Avoid walking through areas with long grass.  Check for ticks on your clothing, hair, and skin often while you are outside, and check again before you go inside. Make sure to check the places that ticks attach themselves most often. These places include the scalp, neck, armpits, waist, groin, and joint areas. Ticks that carry a disease called Lyme disease have to be attached to the skin for 24-48 hours. Checking for ticks every day will lessen your risk of this and other diseases.  When you come indoors, wash your clothes and take a shower or a bath right away. Dry your clothes in a dryer on high heat for at least 60 minutes. This will kill any ticks in your clothes. What is the proper way to remove a tick? If you find a tick on your body, remove it as soon as possible. Removing a tick sooner rather than later can prevent germs from passing from the tick to your body. To remove a tick that is crawling on your skin but has not bitten:  Go outdoors and brush the tick off.  Remove the tick with tape or a lint roller. To remove a tick that is attached to your skin:  Wash your hands.  If you have latex gloves, put them on.  Use tweezers, curved forceps, or a tick-removal tool to gently grasp the tick as close to your skin and the tick's head as possible.  Gently pull with steady, upward pressure until the tick lets go. When removing the tick:  Take care to keep the tick's head attached to its body.  Do not twist or jerk the tick. This can make the  tick's head or mouth break off.  Do not squeeze or crush the tick's body. This could force disease-carrying fluids from the tick into your body. Do not try to remove a tick with heat, alcohol, petroleum jelly, or fingernail polish. Using these methods can cause the tick to salivate and regurgitate into your bloodstream, increasing your risk of getting a disease. What should I do after removing a tick?  Clean the bite area with soap and water, rubbing alcohol, or an iodine scrub.  If an antiseptic cream or ointment is available, apply a small amount to the bite site.  Wash and disinfect any instruments that you used to remove the tick. How should I dispose of a tick? To dispose of a live tick, use one of these methods:  Place it in rubbing alcohol.  Place it in a sealed bag or container.  Wrap it tightly in tape.  Flush it down the toilet. Contact a health care provider if:  You have symptoms of a disease after a tick bite. Symptoms of a tick-borne disease can occur from moments after the tick bites to up to 30 days after a tick is removed. Symptoms include:  Muscle, joint, or bone pain.  Difficulty walking or moving your legs.  Numbness in the legs.  Paralysis.  Red rash around the tick bite area that is shaped like a target or a "bull's-eye."  Redness and swelling in the area of the tick bite.  Fever.  Repeated vomiting.  Diarrhea.  Weight loss.  Tender, swollen lymph glands.  Shortness of breath.  Cough.  Pain in the abdomen.  Headache.  Abnormal tiredness.  A change in your level of consciousness.  Confusion. Get help right away if:  You are not able to remove a tick.  A part of a tick breaks off and gets stuck in your skin.  Your symptoms get worse. Summary  Ticks may carry germs that can spread to a person through a bite and cause disease.  Wear protective clothing and use insect repellent to prevent tick bites. Follow product  instructions.  If you find a tick on your body, remove it as soon as possible. If the tick is attached, do not try to remove with heat, alcohol, petroleum jelly, or fingernail polish.  Remove the attached tick using tweezers, curved forceps, or a tick-removal tool. Gently pull with steady, upward pressure until the tick lets go. Do not twist or jerk the tick. Do not squeeze or crush the tick's body.  If you have symptoms after being bitten by a tick, contact a health care provider. This information is not intended to replace advice given to you by your health care provider. Make sure you discuss any questions you have with your health care provider. Document Released: 10/31/2000 Document Revised: 08/15/2016 Document Reviewed: 08/15/2016 Elsevier Interactive Patient Education  2017 [employer].    An after visit summary with all of these plans was given to the patient.

## 2017-03-16 ENCOUNTER — Telehealth: Payer: Self-pay

## 2017-03-16 NOTE — Telephone Encounter (Signed)
Letter mailed to pt to call an schedule.

## 2017-03-16 NOTE — Telephone Encounter (Signed)
-----   Message from Alphonzo Dublin, LPN sent at 05/28/1974  3:37 PM EDT ----- Regarding: Schedule colon Schedule colon after 03/02/17. Hx of polyps. Pt having heart procedure 03/02/17 and wants to wait to schedule after it.

## 2017-03-18 ENCOUNTER — Ambulatory Visit (INDEPENDENT_AMBULATORY_CARE_PROVIDER_SITE_OTHER): Payer: Medicare Other | Admitting: Family Medicine

## 2017-03-18 ENCOUNTER — Encounter: Payer: Self-pay | Admitting: Family Medicine

## 2017-03-18 VITALS — BP 144/84 | HR 85 | Temp 98.9°F | Resp 16 | Ht 69.0 in | Wt 175.6 lb

## 2017-03-18 DIAGNOSIS — H6123 Impacted cerumen, bilateral: Secondary | ICD-10-CM | POA: Diagnosis not present

## 2017-03-18 NOTE — Progress Notes (Signed)
Chief Complaint  Patient presents with  . Follow-up    states right ear feels full of fuzzy stuff; didn't use drops in left ear(feels empty)  . recheck ears    HPI   Pt is here for impacted cerumen bilaterally  States that he used debrox but not much came out   He states that he feels much better today and discussed   He denies chest pains, palpitations, shortness of breath BP Readings from Last 3 Encounters:  03/18/17 (!) 144/84  03/11/17 (!) 167/69  03/02/17 138/88     Past Medical History:  Diagnosis Date  . Abnormal large bowel motility   . Allergy   . Aortic stenosis   . Cancer ([place])    melanoma  . Cerumen impaction bilaterally  . Hearing loss   . Hyperlipidemia   . Hypertension   . Other fatigue   . Routine general medical examination at a health care facility   . Stool incontinence   . Urgency of urination   . Vision loss of right eye     Current Outpatient Prescriptions  Medication Sig Dispense Refill  . aspirin 325 MG tablet Take 325 mg by mouth daily.    . carbamide peroxide (DEBROX) 6.5 % otic solution Place 5 drops into both ears 2 (two) times daily. 15 mL 0  . cetirizine (ZYRTEC) 10 MG tablet Take 10 mg by mouth as needed.     . Doxylamine Succinate, Sleep, (SLEEP AID PO) Take 1 tablet by mouth at bedtime as needed.    . fish oil-omega-3 fatty acids 1000 MG capsule Take 2 g by mouth daily.    . fluticasone (FLONASE) 50 MCG/ACT nasal spray Place 2 sprays into both nostrils daily. 16 g 6  . Glucosamine 500 MG CAPS Take 1 capsule by mouth daily.     No current facility-administered medications for this visit.     Allergies: No Known Allergies  Past Surgical History:  Procedure Laterality Date  . EYE SURGERY  1981    Right eye- corneal transplant  . HERNIA REPAIR     x3    Social History   Social History  . Marital status: Divorced    Spouse name: N/A  . Number of children: N/A  . Years of education: N/A   Occupational History  .  Retired    Social History Main Topics  . Smoking status: Current Some Day Smoker    Types: Cigars  . Smokeless tobacco: Never Used     Comment: occ pipe  . Alcohol use 9.0 oz/week    15 Standard drinks or equivalent per week  . Drug use: Yes    Types: Marijuana     Comment: marijuana  . Sexual activity: No   Other Topics Concern  . None   Social History Narrative  . None    ROS  Objective: Vitals:   03/18/17 1629  BP: (!) 144/84  Pulse: 85  Resp: 16  Temp: 98.9 F (37.2 C)  TempSrc: Oral  SpO2: 97%  Weight: 175 lb 9.6 oz (79.7 kg)  Height: 5\' 9"  (1.753 m)    Physical Exam  Constitutional: He is oriented to person, place, and time. He appears well-developed and well-nourished.  HENT:  Head: Normocephalic and atraumatic.  Right Ear: Hearing and tympanic membrane normal.  Left Ear: Hearing and tympanic membrane normal.  Bilateral impacted cerumen  Eyes: Conjunctivae and EOM are normal.  Cardiovascular: Normal rate, regular rhythm and normal heart sounds.  Pulmonary/Chest: Effort normal and breath sounds normal. No respiratory distress. He has no wheezes.  Neurological: He is alert and oriented to person, place, and time.  Psychiatric: He has a normal mood and affect. His behavior is normal. Judgment and thought content normal.    Assessment and Plan Meyer was seen today for follow-up and recheck ears.  Diagnoses and all orders for this visit:  Impacted cerumen of both ears- advised to get ear hairs trimmed and would be able to perform wax removal -     Ear wax removal     Yanisa Goodgame A Nolon Rod

## 2017-03-18 NOTE — Patient Instructions (Signed)
     IF you received an x-ray today, you will receive an invoice from [facility]. Please contact [facility] at [phone] with questions or concerns regarding your invoice.   IF you received labwork today, you will receive an invoice from [facility]. Please contact [facility] at [phone] with questions or concerns regarding your invoice.   Our billing staff will not be able to assist you with questions regarding bills from these companies.  You will be contacted with the lab results as soon as they are available. The fastest way to get your results is to activate your My Chart account. Instructions are located on the last page of this paperwork. If you have not heard from us regarding the results in 2 weeks, please contact this office.     

## 2017-03-25 ENCOUNTER — Encounter: Payer: Self-pay | Admitting: Family Medicine

## 2017-03-25 ENCOUNTER — Ambulatory Visit (INDEPENDENT_AMBULATORY_CARE_PROVIDER_SITE_OTHER): Payer: Medicare Other | Admitting: Family Medicine

## 2017-03-25 VITALS — BP 135/75 | HR 85 | Temp 97.7°F | Resp 18 | Ht 69.0 in | Wt 176.6 lb

## 2017-03-25 DIAGNOSIS — H6123 Impacted cerumen, bilateral: Secondary | ICD-10-CM

## 2017-03-25 NOTE — Patient Instructions (Signed)
     IF you received an x-ray today, you will receive an invoice from [facility]. Please contact [facility] at [phone] with questions or concerns regarding your invoice.   IF you received labwork today, you will receive an invoice from [facility]. Please contact [facility] at [phone] with questions or concerns regarding your invoice.   Our billing staff will not be able to assist you with questions regarding bills from these companies.  You will be contacted with the lab results as soon as they are available. The fastest way to get your results is to activate your My Chart account. Instructions are located on the last page of this paperwork. If you have not heard from us regarding the results in 2 weeks, please contact this office.     

## 2017-03-25 NOTE — Progress Notes (Signed)
  Chief Complaint  Patient presents with  . Follow-up  . ears wax    states he don't believe it has got better    HPI   Pt left without being seen.    Past Medical History:  Diagnosis Date  . Abnormal large bowel motility   . Allergy   . Aortic stenosis   . Cancer ([place])    melanoma  . Cerumen impaction bilaterally  . Hearing loss   . Hyperlipidemia   . Hypertension   . Other fatigue   . Routine general medical examination at a health care facility   . Stool incontinence   . Urgency of urination   . Vision loss of right eye     Current Outpatient Prescriptions  Medication Sig Dispense Refill  . aspirin 325 MG tablet Take 325 mg by mouth daily.    . carbamide peroxide (DEBROX) 6.5 % otic solution Place 5 drops into both ears 2 (two) times daily. 15 mL 0  . cetirizine (ZYRTEC) 10 MG tablet Take 10 mg by mouth as needed.     . Doxylamine Succinate, Sleep, (SLEEP AID PO) Take 1 tablet by mouth at bedtime as needed.    . fish oil-omega-3 fatty acids 1000 MG capsule Take 2 g by mouth daily.    . fluticasone (FLONASE) 50 MCG/ACT nasal spray Place 2 sprays into both nostrils daily. 16 g 6  . Glucosamine 500 MG CAPS Take 1 capsule by mouth daily.     No current facility-administered medications for this visit.     Allergies: No Known Allergies  Past Surgical History:  Procedure Laterality Date  . EYE SURGERY  1981    Right eye- corneal transplant  . HERNIA REPAIR     x3    Social History   Social History  . Marital status: Divorced    Spouse name: N/A  . Number of children: N/A  . Years of education: N/A   Occupational History  . Retired    Social History Main Topics  . Smoking status: Current Some Day Smoker    Types: Cigars  . Smokeless tobacco: Never Used     Comment: occ pipe  . Alcohol use 9.0 oz/week    15 Standard drinks or equivalent per week  . Drug use: Yes    Types: Marijuana     Comment: marijuana  . Sexual activity: No   Other Topics  Concern  . None   Social History Narrative  . None    ROS  Objective: Vitals:   03/25/17 1534  BP: 135/75  Pulse: 85  Resp: 18  Temp: 97.7 F (36.5 C)  TempSrc: Oral  SpO2: 97%  Weight: 176 lb 9.6 oz (80.1 kg)  Height: 5\' 9"  (1.753 m)    Physical Exam  Assessment and Plan There are no diagnoses linked to this encounter.   [place]

## 2017-04-07 DIAGNOSIS — H18603 Keratoconus, unspecified, bilateral: Secondary | ICD-10-CM | POA: Diagnosis not present

## 2017-04-07 DIAGNOSIS — H2513 Age-related nuclear cataract, bilateral: Secondary | ICD-10-CM | POA: Diagnosis not present

## 2017-04-07 DIAGNOSIS — H52203 Unspecified astigmatism, bilateral: Secondary | ICD-10-CM | POA: Diagnosis not present

## 2017-04-09 ENCOUNTER — Ambulatory Visit (HOSPITAL_COMMUNITY): Payer: Medicare Other | Attending: Cardiovascular Disease

## 2017-04-09 ENCOUNTER — Other Ambulatory Visit: Payer: Self-pay

## 2017-04-09 DIAGNOSIS — I5030 Unspecified diastolic (congestive) heart failure: Secondary | ICD-10-CM | POA: Diagnosis not present

## 2017-04-09 DIAGNOSIS — I517 Cardiomegaly: Secondary | ICD-10-CM | POA: Insufficient documentation

## 2017-04-09 DIAGNOSIS — I350 Nonrheumatic aortic (valve) stenosis: Secondary | ICD-10-CM | POA: Diagnosis not present

## 2017-04-15 ENCOUNTER — Telehealth: Payer: Self-pay | Admitting: Cardiology

## 2017-04-15 NOTE — Telephone Encounter (Signed)
Reviewed results of echo with pt.  He will repeat in 1 year as ordered.

## 2017-04-15 NOTE — Telephone Encounter (Signed)
New message       Returning a call to the nurse to get echo results.  Ok to leave msg on vm if pt does not answer

## 2017-09-17 ENCOUNTER — Telehealth: Payer: Self-pay

## 2017-09-17 NOTE — Telephone Encounter (Signed)
-----   Message from Yetta Flock, MD sent at 09/17/2017  8:49 AM EDT ----- Regarding: follow up colonoscopy Almyra Free this patient is overdue for a colonoscopy for history of large polypectomy. Would you mild calling him to schedule if he is able to? Thanks

## 2017-09-17 NOTE — Telephone Encounter (Signed)
Thanks Julie

## 2017-09-17 NOTE — Telephone Encounter (Signed)
Spoke to patient, let him that I was calling to schedule him a follow up colonoscopy after his recent polypectomy. He was not able to talk at the moment and asked that I call him back early next week. Will try him again on Monday, 09/21/17.

## 2017-09-23 ENCOUNTER — Telehealth: Payer: Self-pay

## 2017-09-23 NOTE — Telephone Encounter (Signed)
Left message for patient to contact our office as he is past due for his colonoscopy. Spoke to patient last week and he asked that I call back.

## 2017-09-25 ENCOUNTER — Encounter: Payer: Self-pay | Admitting: Gastroenterology

## 2017-09-28 ENCOUNTER — Encounter: Payer: Self-pay | Admitting: Gastroenterology

## 2017-10-30 ENCOUNTER — Ambulatory Visit (AMBULATORY_SURGERY_CENTER): Payer: Self-pay | Admitting: *Deleted

## 2017-10-30 ENCOUNTER — Other Ambulatory Visit: Payer: Self-pay

## 2017-10-30 VITALS — Ht 69.0 in | Wt 179.0 lb

## 2017-10-30 DIAGNOSIS — Z86010 Personal history of colonic polyps: Secondary | ICD-10-CM

## 2017-10-30 MED ORDER — PEG-KCL-NACL-NASULF-NA ASC-C 140 G PO SOLR: 1.0000 | Freq: Once | ORAL | 0 refills | Status: AC

## 2017-10-30 NOTE — Progress Notes (Signed)
Patient denies any allergies to eggs or soy. Patient denies any problems with anesthesia/sedation. Patient denies any oxygen use at home. Patient denies taking any diet/weight loss medications or blood thinners. no email per pt. Last echo 04/09/17 reviewed by [facility] Monday, CRNA. Patient cleared for [place] colonoscopy per [facility].

## 2017-11-03 ENCOUNTER — Telehealth: Payer: Self-pay | Admitting: Gastroenterology

## 2017-11-03 NOTE — Telephone Encounter (Signed)
Okay. We have free samples if this will help. We can reschedule at his convenience. Thanks

## 2017-11-05 ENCOUNTER — Encounter: Payer: Medicare Other | Admitting: Gastroenterology

## 2018-02-11 DIAGNOSIS — H52203 Unspecified astigmatism, bilateral: Secondary | ICD-10-CM | POA: Diagnosis not present

## 2018-02-11 DIAGNOSIS — H18603 Keratoconus, unspecified, bilateral: Secondary | ICD-10-CM | POA: Diagnosis not present

## 2018-03-10 ENCOUNTER — Encounter: Payer: Self-pay | Admitting: Cardiology

## 2018-03-25 ENCOUNTER — Encounter: Payer: Self-pay | Admitting: Cardiology

## 2018-03-25 ENCOUNTER — Ambulatory Visit (INDEPENDENT_AMBULATORY_CARE_PROVIDER_SITE_OTHER): Payer: Medicare Other | Admitting: Cardiology

## 2018-03-25 VITALS — BP 146/86 | HR 67 | Ht 69.0 in | Wt 180.4 lb

## 2018-03-25 DIAGNOSIS — R030 Elevated blood-pressure reading, without diagnosis of hypertension: Secondary | ICD-10-CM | POA: Diagnosis not present

## 2018-03-25 DIAGNOSIS — I350 Nonrheumatic aortic (valve) stenosis: Secondary | ICD-10-CM | POA: Diagnosis not present

## 2018-03-25 DIAGNOSIS — R0789 Other chest pain: Secondary | ICD-10-CM

## 2018-03-25 NOTE — Patient Instructions (Signed)

## 2018-03-25 NOTE — Progress Notes (Signed)
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   He was previously seen in 04/14/14 by Dr. Joseph Art for what he says was generalized malaise, feeling poorly overall. As an after thought, he told me about the chest discomfort that he was having, centrally, tightness with no other associated symptoms. He had earwax removed, amoxicillin for sinus infection and shortly after this, he felt better. He has not had any further chest discomfort. His doctor appreciated a significant murmur and wanted him to see a cardiologist for this.   He continues to be quite active, and splits wood at home. Every now and then he may feel some upper chest discomfort without exertion that he is tripping to his allergies. Rare. Sometimes walking a pills he may feel mild shortness of breath. No syncope.  Aortic stenosis was described as mild in 2013--is has progressed to moderate  In regards to hypertension, he was started a few years ago on lisinopril but has subsequently taken himself off of this medication.   03/25/2018-he walked to our clinic visit today about 1 mile.  No chest pain, no shortness of breath.  He states that he will occasionally have fleeting upper chest wall pain.  Atypical.  No shortness of breath.  He did ask about the etiology behind aortic stenosis.  No bleeding, no syncope.   Wt Readings from Last 3 Encounters:  03/25/18 180 lb 6.4 oz (81.8 kg)  10/30/17 179 lb (81.2 kg)  03/25/17 176 lb 9.6 oz (80.1 kg)     Past Medical History:  Diagnosis Date  . Abnormal large bowel motility   . Allergy   . Aortic stenosis   . Cancer ([place])    melanoma  . Cerumen impaction bilaterally  . Hearing  loss   . Hyperlipidemia   . Hypertension   . Other fatigue   . Routine general medical examination at a health care facility   . Stool incontinence   . Urgency of urination   . Vision loss of right eye     Past Surgical History:  Procedure Laterality Date  . COLONOSCOPY  2017  . EYE SURGERY  1981    Right eye- corneal transplant  . HERNIA REPAIR     x3  . POLYPECTOMY      Current Outpatient Medications  Medication Sig Dispense Refill  . aspirin 325 MG tablet Take 325 mg by mouth daily.    . cetirizine (ZYRTEC) 10 MG tablet Take 10 mg by mouth daily as needed for allergies.    . fish oil-omega-3 fatty acids 1000 MG capsule Take 2 g by mouth daily.    . Glucosamine 500 MG CAPS Take 1 capsule by mouth daily.    . Pseudoephedrine HCl (SUDAFED 12 HOUR PO) Take 1 tablet by mouth daily as needed.     No current facility-administered medications for this visit.     Allergies:   No Known Allergies  Social History:  The patient  reports that he has been smoking cigars.  He has never used smokeless tobacco. He reports that he drinks about 9.0 oz of alcohol per week. He reports that he has current or past  drug history. Drug: Marijuana.   Family History  Problem Relation Age of Onset  . Heart disease Father   . Hyperlipidemia Father   . Hypertension Mother   . Mental retardation Mother   . Hyperlipidemia Brother   . Hypertension Brother   . Colon cancer Neg Hx   . Colon polyps Neg Hx   . Rectal cancer Neg Hx   . Stomach cancer Neg Hx   . Esophageal cancer Neg Hx     ROS:  Unless stated in history of present illness above, all other review of systems negative  PHYSICAL EXAM: VS:  BP (!) 146/86   Pulse 67   Ht 5\' 9"  (1.753 m)   Wt 180 lb 6.4 oz (81.8 kg)   BMI 26.64 kg/m  GEN: Well nourished, well developed, in no acute distress  HEENT: normal  Neck: no JVD, carotid bruits, or masses Cardiac: RRR; 3/6 SM, no rubs, or gallops,no edema  Respiratory:  clear to  auscultation bilaterally, normal work of breathing GI: soft, nontender, nondistended, + BS MS: no deformity or atrophy  Skin: warm and dry, no rash Neuro:  Alert and Oriented x 3, Strength and sensation are intact Psych: euthymic mood, full affect    EKG:  EKG was ordered today: 03/25/2018-sinus rhythm nonspecific ST-T wave changes personally viewed-prior 03/02/17-sinus rhythm, nonspecific ST-T wave changes personally viewed-prior 03/14/16-sinus rhythm, nonspecific ST-T wave changes, no other obvious abnormalities personally viewed-prior 04/13/14-sinus rhythm, 68, no obvious abnormalities.  Echocardiogram 04/03/16  - Left ventricle: The cavity size was normal. There was mild focal   basal hypertrophy of the septum. Systolic function was normal.   The estimated ejection fraction was in the range of 55% to 60%.   Wall motion was normal; there were no regional wall motion   abnormalities. Doppler parameters are consistent with abnormal   left ventricular relaxation (grade 1 diastolic dysfunction). - Aortic valve: Trileaflet; moderately thickened, moderately   calcified leaflets. Valve mobility was restricted. There was   moderate stenosis. Peak velocity (S): 319 cm/s. Mean gradient   (S): 23 mm Hg. - Right ventricle: The cavity size was mildly dilated. Wall   thickness was normal. - Pulmonary arteries: Systolic pressure was mildly increased. PA   peak pressure: 32 mm Hg (S).  Impressions:  - When compared to prior, aortic stenosis has mildly advanced   (prior mean 69mmHg).  Labs: 4/81/85-UDJ, basic metabolic profile unremarkable.  ASSESSMENT AND PLAN:  Moderate aortic stenosis  - Last echocardiogram 03/2017 demonstrated moderate aortic stenosis with slight advancement. We will check echocardiogram again.  - No high-risk symptoms currently.  No syncope, no bleeding, no chest pain, no shortness of breath.  In fact, he walked to his appointment today about 1 mile.  Atypical chest pain   - Occasional upper chest wall discomfort fleeting once again.  This is not brought on by exertional activity. We will continue to follow.  Does not sound typical.  Hyperlipidemia  - LDL 160 range. Not interested in statin therapy.  - Diet, exercise.  Doing well.  Elevated blood pressure previously without diagnosis of hypertension  - Blood pressure better today. Continue to monitor.  138/78 on repeat.  See back in a year. He will call me earlier if symptoms progress.  Signed, Candee Furbish, MD [facility]  03/25/2018 1:16 PM

## 2018-03-29 ENCOUNTER — Ambulatory Visit (HOSPITAL_COMMUNITY): Payer: Medicare Other | Attending: Cardiology

## 2018-03-29 ENCOUNTER — Other Ambulatory Visit: Payer: Self-pay

## 2018-03-29 DIAGNOSIS — I350 Nonrheumatic aortic (valve) stenosis: Secondary | ICD-10-CM

## 2018-03-29 DIAGNOSIS — I361 Nonrheumatic tricuspid (valve) insufficiency: Secondary | ICD-10-CM | POA: Insufficient documentation

## 2018-04-01 ENCOUNTER — Other Ambulatory Visit: Payer: Self-pay | Admitting: *Deleted

## 2018-04-01 DIAGNOSIS — I350 Nonrheumatic aortic (valve) stenosis: Secondary | ICD-10-CM

## 2018-04-27 ENCOUNTER — Telehealth: Payer: Self-pay | Admitting: Cardiology

## 2018-04-27 NOTE — Telephone Encounter (Signed)
I would prefer he stay with Dr. Marlou Porch

## 2018-04-27 NOTE — Telephone Encounter (Signed)
Dr. Roselie Awkward contacted the Office of Patient Experience to express a disconnect with his care at our office. I reached out to better understand his experience and he stated that he felt he hasn't received a lot of "context" for his cardiovascular health and would like an office visit with Dr. Radford Pax to get a second opinion.   This could lead to a request to transfer providers - Dr .Marlou Porch and Dr. Radford Pax, are you both okay with this possibility (if that's the patient's preference?)

## 2018-04-28 NOTE — Telephone Encounter (Signed)
I have reviewed the 2D echo which is approaching severe AS.  Given patient's concerns regarding progression of AV disease, my opinion is that he be referred to structural heart team with Dr. Angelena Form or Dr.Cooper for evaluation and to follow.

## 2018-04-28 NOTE — Telephone Encounter (Signed)
Routing back to Dr. Radford Pax for review per phone call.

## 2018-04-28 NOTE — Telephone Encounter (Signed)
I contacted the patient to inform him of Dr. Theodosia Blender second opinion. I told the patient that a nurse from our structural heart team would contact him next week regarding a consult.   I told him that per my conversation with Dr. Radford Pax she doesn't disagree with Dr. Kingsley Plan follow up instructions as long as the patient is asymptomatic. He stated that "nobody told me" what symptoms he should be on the lookout for. I began to explain chest pain and SOB  and he quickly asked that I send him something in writing to clarify what symptoms he should be on the lookout for. I am printing his last AVS and mailing it to him.  I let the patient know that I am not clinical and cannot provide answers outside of what I can see documented his medical record, however, someone would contact him next week with more information for next steps and he could ask additional questions at that time.

## 2018-05-04 NOTE — Telephone Encounter (Signed)
I contacted the pt by phone and introduced myself. In explaining my role the pt made me aware that he already has a pending appointment with Dr Radford Pax for second opinion.  The pt is currently at lunch with a friend and I gave him my phone number to contact me to discuss his Valvular Heart disease if he would like further education.  I will go ahead and officially book the pt's apt with Dr Radford Pax on 05/28/18.

## 2018-05-28 ENCOUNTER — Encounter: Payer: Self-pay | Admitting: Cardiology

## 2018-05-28 ENCOUNTER — Ambulatory Visit (INDEPENDENT_AMBULATORY_CARE_PROVIDER_SITE_OTHER): Payer: Medicare Other | Admitting: Cardiology

## 2018-05-28 VITALS — BP 144/96 | HR 75 | Ht 69.0 in | Wt 180.0 lb

## 2018-05-28 DIAGNOSIS — I350 Nonrheumatic aortic (valve) stenosis: Secondary | ICD-10-CM

## 2018-05-28 DIAGNOSIS — E7800 Pure hypercholesterolemia, unspecified: Secondary | ICD-10-CM

## 2018-05-28 DIAGNOSIS — I10 Essential (primary) hypertension: Secondary | ICD-10-CM | POA: Diagnosis not present

## 2018-05-28 NOTE — Patient Instructions (Addendum)
Medication Instructions:  Your physician recommends that you continue on your current medications as directed. Please refer to the Current Medication list given to you today.  If you need a refill on your cardiac medications, please contact your pharmacy first.  Labwork: None ordered   Testing/Procedures: None ordered   Follow-Up: You have been referred to structural heart team   Your physician recommends that you follow up with Dr. Marlou Porch as scheduled   Any Other Special Instructions Will Be Listed Below (If Applicable).   Thank you for choosing [facility], RN  [phone]  If you need a refill on your cardiac medications before your next appointment, please call your pharmacy.

## 2018-05-28 NOTE — Progress Notes (Signed)
Cardiology Office Note    Date:  05/28/2018   ID:  Charles Robles, DOB 1946/08/20, MRN [id number]  PCP:  Forrest Moron, MD  Cardiologist:  Fransico Him, MD   Chief Complaint  Patient presents with  . Aortic Stenosis    History of Present Illness:  Charles Robles is a 72 y.o. male who is being seen today for the evaluation and second opinion of aortic stenosis at the request of Forrest Moron, MD.  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 Marlou Porch.  He was initially referred back in 2015 because of generalized malaise and chest discomfort.  He was found to have a heart murmur and he was referred to cardiology for echo.  Apparently he had aortic stenosis described as mild back in 2013 which is over the years progressed.  He also has a history of hypertension.  He was seen by Dr. Marlou Porch back in May 2019 and was denying any shortness of breath, dyspnea on exertion, lower extremity edema or exercise intolerance.  2D echocardiogram done 04/03/2016 showed normal LV function with moderate aortic stenosis with a mean gradient 23 mmHg.  Repeat 2D echo on 03/29/2018 showed normal LV function with EF 60 to 65% with normal LV size.  There was mild concentric hypertrophy.  There was some evidence of grade 1 diastolic dysfunction.  He was found to have moderate to severe aortic stenosis with a mean aortic valve gradient of 38 mmHg and aortic valve area calculated 1.06 cm.  The patient is now here for a second opinion as he is concerned about the progression of his aortic valve disease.  He has a lot of questions concerning his stenosis.  In questioning about symptoms he says that he will intermittently have some pressure on his chest when he walks the dog up hills which improves when he rests.  He says occasionally he will have some mild shortness of breath as well but says that a lot of the  symptoms started right after it was told that his valve had gotten worse so he is not sure if these are real symptoms or if he is imagining them.  He is quite active and is able to walk a mile to 2 miles a day but does get tired.  He is very concerned about the progression of his aortic valve disease.  He does have a family history of coronary disease as well and his father and he is quite concerned that some of his symptoms could also be due to underlying coronary disease.   Past Medical History:  Diagnosis Date  . Abnormal large bowel motility   . Allergy   . Aortic stenosis   . Cancer ([place])    melanoma  . Cerumen impaction bilaterally  . Hearing loss   . Hyperlipidemia   . Hypertension   . Other fatigue   . Routine general medical examination at a health care facility   . Stool incontinence   . Urgency of urination   . Vision loss of right eye     Past Surgical History:  Procedure Laterality Date  . COLONOSCOPY  2017  . EYE SURGERY  1981    Right eye- corneal transplant  . HERNIA REPAIR     x3  . POLYPECTOMY      Current Medications: Current Meds  Medication Sig  . aspirin 325 MG tablet Take  325 mg by mouth daily.  . cetirizine (ZYRTEC) 10 MG tablet Take 10 mg by mouth daily as needed for allergies.  . fish oil-omega-3 fatty acids 1000 MG capsule Take 2 g by mouth daily.  . Glucosamine 500 MG CAPS Take 1 capsule by mouth daily.  . Menaquinone-7 (VITAMIN K2 PO) Take 1 capsule by mouth daily.  . Pseudoephedrine HCl (SUDAFED 12 HOUR PO) Take 1 tablet by mouth daily as needed.    Allergies:   Patient has no known allergies.   Social History   Socioeconomic History  . Marital status: Divorced    Spouse name: Not on file  . Number of children: Not on file  . Years of education: Not on file  . Highest education level: Not on file  Occupational History  . Occupation: Retired  Scientific laboratory technician  . Financial resource strain: Not on file  . Food insecurity:    Worry: Not  on file    Inability: Not on file  . Transportation needs:    Medical: Not on file    Non-medical: Not on file  Tobacco Use  . Smoking status: Current Some Day Smoker    Types: Cigars  . Smokeless tobacco: Never Used  . Tobacco comment: occ pipe  Substance and Sexual Activity  . Alcohol use: Yes    Alcohol/week: 9.0 oz    Types: 3 Glasses of wine, 12 Cans of beer per week    Comment: 12 beers per week & 1 bottle wine per week per pt  . Drug use: Yes    Types: Marijuana    Comment: marijuana daily   . Sexual activity: Never  Lifestyle  . Physical activity:    Days per week: Not on file    Minutes per session: Not on file  . Stress: Not on file  Relationships  . Social connections:    Talks on phone: Not on file    Gets together: Not on file    Attends religious service: Not on file    Active member of club or organization: Not on file    Attends meetings of clubs or organizations: Not on file    Relationship status: Not on file  Other Topics Concern  . Not on file  Social History Narrative  . Not on file     Family History:  The patient's family history includes Heart disease in his father; Hyperlipidemia in his brother and father; Hypertension in his brother and mother; Mental retardation in his mother.   ROS:   Please see the history of present illness.    ROS All other systems reviewed and are negative.  No flowsheet data found.     PHYSICAL EXAM:   VS:  BP (!) 144/96   Pulse 75   Ht 5\' 9"  (1.753 m)   Wt 180 lb (81.6 kg)   SpO2 96%   BMI 26.58 kg/m    GEN: Well nourished, well developed, in no acute distress  HEENT: normal  Neck: no JVD or masses.  Bilateral carotid bruits are present which is likely radiation of his systolic murmur from his aortic stenosis Cardiac: RRR; no rubs, or gallops,no edema.  Intact distal pulses bilaterally. 2/6 late peaking SM at RUSB to LLSB Respiratory:  clear to auscultation bilaterally, normal work of breathing GI: soft,  nontender, nondistended, + BS MS: no deformity or atrophy  Skin: warm and dry, no rash Neuro:  Alert and Oriented x 3, Strength and sensation are intact Psych: euthymic  mood, full affect  Wt Readings from Last 3 Encounters:  05/28/18 180 lb (81.6 kg)  03/25/18 180 lb 6.4 oz (81.8 kg)  10/30/17 179 lb (81.2 kg)      Studies/Labs Reviewed:   EKG:  EKG is not ordered today.    Recent Labs: No results found for requested labs within last 8760 hours.   Lipid Panel    Component Value Date/Time   CHOL 306 (H) 03/11/2017 1141   TRIG 161 (H) 03/11/2017 1141   HDL 87 03/11/2017 1141   CHOLHDL 3.5 03/11/2017 1141   CHOLHDL 3.4 01/16/2016 1207   VLDL 38 (H) 01/16/2016 1207   LDLCALC 187 (H) 03/11/2017 1141    Additional studies/ records that were reviewed today include:  Office notes, 2D echo 03/2018 and 2018    ASSESSMENT:    1. Nonrheumatic aortic valve stenosis   2. Essential hypertension   3. Pure hypercholesterolemia      PLAN:  In order of problems listed above:  1. Moderate to severe aortic stenosis with mean AVG 42mmHg by echo by 2D echo 03/2018.  Exam his aortic stenosis is severe with loss of the A2 component of the second heart sound as well as late peaking systolic murmur.  He has some vague symptoms of decreased exercise tolerance, dyspnea on exertion at times and some vague chest pressure when walking his dog.  He also has a family history of coronary disease.  Given the severity of his aortic stenosis as well as his symptoms I think it is prudent to go ahead and refer him to the structural heart disease team.  He will be set up for an office visit with Dr. Angelena Form or Dr. Burt Knack for further evaluation and will likely need a right and left heart catheterization.  He will also follow-up in a year as already stated by Dr. Marlou Porch who is his primary cardiologist.  2.   HTN - BP is well controlled on exam today.  He is not on any antihypertensive meds.  3.   Hyperlipidemia - LDL was 187 a year ago and is followed by his PCP.    I have spent a total of 35 minutes with patient reviewing serial echoes , EKGs, labs and examining patient as well as establishing an assessment and plan that was discussed with the patient.  > 50% of time was spent in direct patient care.     Medication Adjustments/Labs and Tests Ordered: Current medicines are reviewed at length with the patient today.  Concerns regarding medicines are outlined above.  Medication changes, Labs and Tests ordered today are listed in the Patient Instructions below.  There are no Patient Instructions on file for this visit.   Signed, Fransico Him, MD  05/28/2018 3:04 PM    [facility] [address], [place], [place]  [address] Phone: [phone]; Fax: [phone]

## 2018-06-11 NOTE — Progress Notes (Signed)
Valve Clinic Consult Note  Chief Complaint  Patient presents with  . 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 Radford Pax, for further evaluation of his aortic stenosis and discussion regarding possible TAVR. He has been followed for moderate aortic stenosis for several years. No known CAD. Echo May 2019 with LVEF=60-65%. The aortic valve is thickened and calcified with limited mobility. The mean gradient across the valve is 38 mmHg, peak gradient 63 mmHg. AVA 1.06 cm2. DVI 0.31. He was seen by Dr. Marlou Porch in May 2019 but had no symptoms at that time. He then sought a second opinion regarding his AS from Dr. Radford Pax on 7/123/19. He is a retired Engineer, water and very concerned about the progression of his aortic valve disease.   He tells me today that he has chest pressure with exertion and at rest. He has exercise intolerance. He has dizziness at times but no syncope. No lower extremity edema. He sees the dentist several times per year. He lives alone. He is here today with his male friend.   Primary Care Physician: Forrest Moron, MD Primary Cardiologist: Stanton Kidney Referring Cardiologist: Fransico Him  Past Medical History:  Diagnosis Date  . Abnormal large bowel motility   . Allergy   . Aortic stenosis   . Cancer ([place])    melanoma  . Cerumen impaction bilaterally  . Hearing loss   . Hyperlipidemia   . Hypertension   . Other fatigue   . Routine general medical examination at a health care facility   . Stool incontinence   . Urgency of urination   . Vision loss of right eye     Past Surgical History:  Procedure Laterality Date  . COLONOSCOPY  2017  . EYE SURGERY  1981    Right eye- corneal transplant  . HERNIA REPAIR     x3  . POLYPECTOMY    . TONSILLECTOMY      Current Outpatient  Medications  Medication Sig Dispense Refill  . aspirin 325 MG tablet Take 325 mg by mouth daily.    . cetirizine (ZYRTEC) 10 MG tablet Take 10 mg by mouth daily as needed for allergies.    . fish oil-omega-3 fatty acids 1000 MG capsule Take 2 g by mouth daily.    . Glucosamine 500 MG CAPS Take 1 capsule by mouth daily.    . Menaquinone-7 (VITAMIN K2 PO) Take 1 capsule by mouth daily.    . Pseudoephedrine HCl (SUDAFED 12 HOUR PO) Take 1 tablet by mouth daily as needed.     No current facility-administered medications for this visit.     No Known Allergies  Social History   Socioeconomic History  . Marital status: Divorced    Spouse name: Not on file  . Number of children: 0  . Years of education: Not on file  . Highest education level: Not on file  Occupational History  . Occupation: Retired-Psychologist  Social Needs  . Financial resource strain: Not on file  . Food insecurity:    Worry: Not on file    Inability: Not on file  . Transportation needs:    Medical: Not on file    Non-medical: Not on file  Tobacco Use  . Smoking status: Current Some Day Smoker    Types: Cigars  . Smokeless tobacco:  Never Used  . Tobacco comment: occ pipe  Substance and Sexual Activity  . Alcohol use: Yes    Alcohol/week: 9.0 oz    Types: 3 Glasses of wine, 12 Cans of beer per week    Comment: 12 beers per week & 1 bottle wine per week per pt  . Drug use: Yes    Types: Marijuana    Comment: marijuana daily   . Sexual activity: Never  Lifestyle  . Physical activity:    Days per week: Not on file    Minutes per session: Not on file  . Stress: Not on file  Relationships  . Social connections:    Talks on phone: Not on file    Gets together: Not on file    Attends religious service: Not on file    Active member of club or organization: Not on file    Attends meetings of clubs or organizations: Not on file    Relationship status: Not on file  . Intimate partner violence:    Fear of  current or ex partner: Not on file    Emotionally abused: Not on file    Physically abused: Not on file    Forced sexual activity: Not on file  Other Topics Concern  . Not on file  Social History Narrative  . Not on file    Family History  Problem Relation Age of Onset  . Heart disease Father        MI  . Hyperlipidemia Father   . Hypertension Mother   . Alcohol abuse Mother   . Hyperlipidemia Brother   . Hypertension Brother   . Colon cancer Neg Hx   . Colon polyps Neg Hx   . Rectal cancer Neg Hx   . Stomach cancer Neg Hx   . Esophageal cancer Neg Hx     Review of Systems:  As stated in the HPI and otherwise negative.   BP (!) 148/80   Pulse 67   Ht 5\' 9"  (1.753 m)   Wt 181 lb (82.1 kg)   SpO2 97%   BMI 26.73 kg/m   Physical Examination: General: Well developed, well nourished, NAD  HEENT: OP clear, mucus membranes moist  SKIN: warm, dry. No rashes. Neuro: No focal deficits  Musculoskeletal: Muscle strength 5/5 all ext  Psychiatric: Mood and affect normal  Neck: No JVD, no carotid bruits, no thyromegaly, no lymphadenopathy.  Lungs:Clear bilaterally, no wheezes, rhonci, crackles Cardiovascular: Regular rate and rhythm. Loud, harsh, late peaking systolic murmur.  Abdomen:Soft. Bowel sounds present. Non-tender.  Extremities: No lower extremity edema. Pulses are 2 + in the bilateral DP/PT.  Echo 03/29/18: - Left ventricle: The cavity size was normal. There was mild   concentric hypertrophy. Systolic function was normal. The   estimated ejection fraction was in the range of 60% to 65%. Wall   motion was normal; there were no regional wall motion   abnormalities. There was an increased relative contribution of   atrial contraction to ventricular filling. Doppler parameters are   consistent with abnormal left ventricular relaxation (grade 1   diastolic dysfunction). - Aortic valve: Valve mobility was restricted. There was moderate   stenosis. There was trivial  regurgitation. Mean gradient (S): 38   mm Hg. Valve area (VTI): 1.06 cm^2. Valve area (Vmax): 1.1 cm^2.   Valve area (Vmean): 1.07 cm^2. - Mitral valve: Calcified annulus. There was trivial regurgitation. - Tricuspid valve: There was mild regurgitation. - Pulmonic valve: There was trivial regurgitation.  Left ventricle:  The cavity size was normal. There was mild concentric hypertrophy. Systolic function was normal. The estimated ejection fraction was in the range of 60% to 65%. Wall motion was normal; there were no regional wall motion abnormalities. There was an increased relative contribution of atrial contraction to ventricular filling. Doppler parameters are consistent with abnormal left ventricular relaxation (grade 1 diastolic dysfunction).  ------------------------------------------------------------------- Aortic valve:   Trileaflet; severely thickened, severely calcified leaflets. Valve mobility was restricted.  Doppler:   There was moderate stenosis.   There was trivial regurgitation.    VTI ratio of LVOT to aortic valve: 0.31. Valve area (VTI): 1.06 cm^2. Indexed valve area (VTI): 0.53 cm^2/m^2. Peak velocity ratio of LVOT to aortic valve: 0.32. Valve area (Vmax): 1.1 cm^2. Indexed valve area (Vmax): 0.55 cm^2/m^2. Mean velocity ratio of LVOT to aortic valve: 0.31. Valve area (Vmean): 1.07 cm^2. Indexed valve area (Vmean): 0.53 cm^2/m^2.    Mean gradient (S): 38 mm Hg. Peak gradient (S): 63 mm Hg.  ------------------------------------------------------------------- Aorta:  Aortic root: The aortic root was normal in size.  ------------------------------------------------------------------- Mitral valve:   Calcified annulus. Mobility was not restricted. Doppler:  Transvalvular velocity was within the normal range. There was no evidence for stenosis. There was trivial regurgitation.  ------------------------------------------------------------------- Left atrium:   The atrium was normal in size.  ------------------------------------------------------------------- Right ventricle:  The cavity size was normal. Wall thickness was normal. Systolic function was normal.  ------------------------------------------------------------------- Pulmonic valve:    Structurally normal valve.   Cusp separation was normal.  Doppler:  Transvalvular velocity was within the normal range. There was no evidence for stenosis. There was trivial regurgitation.  ------------------------------------------------------------------- Tricuspid valve:   Structurally normal valve.    Doppler: Transvalvular velocity was within the normal range. There was mild regurgitation.  ------------------------------------------------------------------- Pulmonary artery:   The main pulmonary artery was normal-sized. Systolic pressure was within the normal range.  ------------------------------------------------------------------- Right atrium:  The atrium was normal in size.  ------------------------------------------------------------------- Pericardium:  There was no pericardial effusion.  ------------------------------------------------------------------- Systemic veins: Inferior vena cava: The vessel was normal in size. The respirophasic diameter changes were in the normal range (= 50%), consistent with normal central venous pressure. Diameter: 12 mm.  ------------------------------------------------------------------- Measurements   IVC                                      Value          Reference  ID                                       12    mm       ----------    Left ventricle                           Value          Reference  LV ID, ED, PLAX chordal          (L)     38.2  mm       43 - 52  LV ID, ES, PLAX chordal                  23.7  mm       23 - 38  LV fx shortening, PLAX chordal  38    %        >=29  LV PW thickness, ED                      14.3   mm       ----------  IVS/LV PW ratio, ED                      0.87           <=1.3  Stroke volume, 2D                        102   ml       ----------  Stroke volume/bsa, 2D                    51    ml/m^2   ----------  LV e&', lateral                           7.07  cm/s     ----------  LV E/e&', lateral                         8.64           ----------  LV e&', medial                            6.64  cm/s     ----------  LV E/e&', medial                          9.2            ----------  LV e&', average                           6.86  cm/s     ----------  LV E/e&', average                         8.91           ----------    Ventricular septum                       Value          Reference  IVS thickness, ED                        12.4  mm       ----------    LVOT                                     Value          Reference  LVOT ID, S                               21    mm       ----------  LVOT area  3.46  cm^2     ----------  LVOT peak velocity, S                    126   cm/s     ----------  LVOT mean velocity, S                    88.1  cm/s     ----------  LVOT VTI, S                              29.6  cm       ----------  LVOT peak gradient, S                    6     mm Hg    ----------    Aortic valve                             Value          Reference  Aortic valve peak velocity, S            396   cm/s     ----------  Aortic valve mean velocity, S            285   cm/s     ----------  Aortic valve VTI, S                      97    cm       ----------  Aortic mean gradient, S                  38    mm Hg    ----------  Aortic peak gradient, S                  63    mm Hg    ----------  VTI ratio, LVOT/AV                       0.31           ----------  Aortic valve area, VTI                   1.06  cm^2     ----------  Aortic valve area/bsa, VTI               0.53  cm^2/m^2 ----------  Velocity ratio, peak, LVOT/AV            0.32            ----------  Aortic valve area, peak velocity         1.1   cm^2     ----------  Aortic valve area/bsa, peak              0.55  cm^2/m^2 ----------  velocity  Velocity ratio, mean, LVOT/AV            0.31           ----------  Aortic valve area, mean velocity         1.07  cm^2     ----------  Aortic valve area/bsa, mean              0.53  cm^2/m^2 ----------  velocity  Aortic regurg pressure half-time  355   ms       ----------    Aorta                                    Value          Reference  Aortic root ID, ED                       31    mm       ----------    Left atrium                              Value          Reference  LA ID, A-P, ES                           36    mm       ----------  LA ID/bsa, A-P                           1.79  cm/m^2   <=2.2  LA volume, S                             51.9  ml       ----------  LA volume/bsa, S                         25.8  ml/m^2   ----------  LA volume, ES, 1-p A4C                   47.2  ml       ----------  LA volume/bsa, ES, 1-p A4C               23.5  ml/m^2   ----------  LA volume, ES, 1-p A2C                   56.7  ml       ----------  LA volume/bsa, ES, 1-p A2C               28.2  ml/m^2   ----------    Mitral valve                             Value          Reference  Mitral E-wave peak velocity              61.1  cm/s     ----------  Mitral A-wave peak velocity              88.3  cm/s     ----------  Mitral deceleration time         (H)     278   ms       150 - 230  Mitral E/A ratio, peak                   0.7            ----------    Pulmonary arteries  Value          Reference  PA pressure, S, DP                       30    mm Hg    <=30    Tricuspid valve                          Value          Reference  Tricuspid regurg peak velocity           258   cm/s     ----------  Tricuspid peak RV-RA gradient            27    mm Hg    ----------  Tricuspid maximal regurg                 258   cm/s      ----------  velocity, PISA    Right atrium                             Value          Reference  RA ID, S-I, ES, A4C                      47.6  mm       34 - 49  RA area, ES, A4C                         14.6  cm^2     8.3 - 19.5  RA volume, ES, A/L                       37.2  ml       ----------  RA volume/bsa, ES, A/L                   18.5  ml/m^2   ----------    Systemic veins                           Value          Reference  Estimated CVP                            3     mm Hg    ----------    Right ventricle                          Value          Reference  TAPSE                                    32.1  mm       ----------  RV pressure, S, DP                       30    mm Hg    <=30  RV s&', lateral, S                        15.3  cm/s     ----------  EKG:  EKG is ordered today. The ekg ordered today demonstrates NSR, rate 65 bpm. Non-specific ST abnormatlity  Recent Labs: No results found for requested labs within last 8760 hours.    Wt Readings from Last 3 Encounters:  06/14/18 181 lb (82.1 kg)  05/28/18 180 lb (81.6 kg)  03/25/18 180 lb 6.4 oz (81.8 kg)     Other studies Reviewed: Additional studies/ records that were reviewed today include: I have personally reviewed the echo images, EKG, office notes.  Review of the above records demonstrates: He has severe AS.    Assessment and Plan:   1. Severe aortic stenosis: He has moderately severe to severe aortic valve stenosis and is now having symptoms of chest pain and dyspnea as well as fatigue. I have personally reviewed the echo images. The aortic valve is thickened, calcified with limited leaflet mobility. I think he would benefit from AVR. He would be a good candidate for TAVR but will have to review with the valve team to decide between surgical AVR and TAVR. STS risk score 0.696.   STS Risk Score: Procedure: Isolated AVR  Risk of Mortality: 0.696%  Renal Failure: 0.801%  Permanent Stroke: 0.849%  Prolonged  Ventilation: 2.899%  DSW Infection: 0.082%  Reoperation: 3.370%  Morbidity or Mortality:6.339%  Short Length of Stay: 62.829%  Long Length of Stay: 1.550%   I have reviewed the natural history of aortic stenosis with the patient and their family members  who are present today. We have discussed the limitations of medical therapy and the poor prognosis associated with symptomatic aortic stenosis. We have reviewed potential treatment options, including palliative medical therapy, conventional surgical aortic valve replacement, and transcatheter aortic valve replacement. We discussed treatment options in the context of the patient's specific comorbid medical conditions.    He would like to proceed with planning for TAVR vs surgical AVR. I will arrange a right and left heart catheterization at [facility] 06/23/18 at 1pm. Risks and benefits of the cath procedure and the TAVR procedure are reviewed with the patient. After the cath, he will have a cardiac CT, CTA of the chest/abdomen and pelvis, PFTs, carotid dopplers, PT assessment and will then be referred to see one of the CT surgeons on our TAVR team.    Current medicines are reviewed at length with the patient today.  The patient does not have concerns regarding medicines.  The following changes have been made:  no change  Labs/ tests ordered today include:   Orders Placed This Encounter  Procedures  . CBC  . Basic Metabolic Panel (BMET)   Disposition:   F/U with the valve team after his cath.   Signed, Lauree Chandler, MD 06/14/2018 9:54 AM    [facility] [address], [place], [place]  [address] Phone: [phone]; Fax: [phone]

## 2018-06-11 NOTE — H&P (View-Only) (Signed)
Valve Clinic Consult Note  Chief Complaint  Patient presents with  . 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 Radford Pax, for further evaluation of his aortic stenosis and discussion regarding possible TAVR. He has been followed for moderate aortic stenosis for several years. No known CAD. Echo May 2019 with LVEF=60-65%. The aortic valve is thickened and calcified with limited mobility. The mean gradient across the valve is 38 mmHg, peak gradient 63 mmHg. AVA 1.06 cm2. DVI 0.31. He was seen by Dr. Marlou Porch in May 2019 but had no symptoms at that time. He then sought a second opinion regarding his AS from Dr. Radford Pax on 7/123/19. He is a retired Engineer, water and very concerned about the progression of his aortic valve disease.   He tells me today that he has chest pressure with exertion and at rest. He has exercise intolerance. He has dizziness at times but no syncope. No lower extremity edema. He sees the dentist several times per year. He lives alone. He is here today with his male friend.   Primary Care Physician: Forrest Moron, MD Primary Cardiologist: Stanton Kidney Referring Cardiologist: Fransico Him  Past Medical History:  Diagnosis Date  . Abnormal large bowel motility   . Allergy   . Aortic stenosis   . Cancer ([place])    melanoma  . Cerumen impaction bilaterally  . Hearing loss   . Hyperlipidemia   . Hypertension   . Other fatigue   . Routine general medical examination at a health care facility   . Stool incontinence   . Urgency of urination   . Vision loss of right eye     Past Surgical History:  Procedure Laterality Date  . COLONOSCOPY  2017  . EYE SURGERY  1981    Right eye- corneal transplant  . HERNIA REPAIR     x3  . POLYPECTOMY    . TONSILLECTOMY      Current Outpatient  Medications  Medication Sig Dispense Refill  . aspirin 325 MG tablet Take 325 mg by mouth daily.    . cetirizine (ZYRTEC) 10 MG tablet Take 10 mg by mouth daily as needed for allergies.    . fish oil-omega-3 fatty acids 1000 MG capsule Take 2 g by mouth daily.    . Glucosamine 500 MG CAPS Take 1 capsule by mouth daily.    . Menaquinone-7 (VITAMIN K2 PO) Take 1 capsule by mouth daily.    . Pseudoephedrine HCl (SUDAFED 12 HOUR PO) Take 1 tablet by mouth daily as needed.     No current facility-administered medications for this visit.     No Known Allergies  Social History   Socioeconomic History  . Marital status: Divorced    Spouse name: Not on file  . Number of children: 0  . Years of education: Not on file  . Highest education level: Not on file  Occupational History  . Occupation: Retired-Psychologist  Social Needs  . Financial resource strain: Not on file  . Food insecurity:    Worry: Not on file    Inability: Not on file  . Transportation needs:    Medical: Not on file    Non-medical: Not on file  Tobacco Use  . Smoking status: Current Some Day Smoker    Types: Cigars  . Smokeless tobacco:  Never Used  . Tobacco comment: occ pipe  Substance and Sexual Activity  . Alcohol use: Yes    Alcohol/week: 9.0 oz    Types: 3 Glasses of wine, 12 Cans of beer per week    Comment: 12 beers per week & 1 bottle wine per week per pt  . Drug use: Yes    Types: Marijuana    Comment: marijuana daily   . Sexual activity: Never  Lifestyle  . Physical activity:    Days per week: Not on file    Minutes per session: Not on file  . Stress: Not on file  Relationships  . Social connections:    Talks on phone: Not on file    Gets together: Not on file    Attends religious service: Not on file    Active member of club or organization: Not on file    Attends meetings of clubs or organizations: Not on file    Relationship status: Not on file  . Intimate partner violence:    Fear of  current or ex partner: Not on file    Emotionally abused: Not on file    Physically abused: Not on file    Forced sexual activity: Not on file  Other Topics Concern  . Not on file  Social History Narrative  . Not on file    Family History  Problem Relation Age of Onset  . Heart disease Father        MI  . Hyperlipidemia Father   . Hypertension Mother   . Alcohol abuse Mother   . Hyperlipidemia Brother   . Hypertension Brother   . Colon cancer Neg Hx   . Colon polyps Neg Hx   . Rectal cancer Neg Hx   . Stomach cancer Neg Hx   . Esophageal cancer Neg Hx     Review of Systems:  As stated in the HPI and otherwise negative.   BP (!) 148/80   Pulse 67   Ht 5\' 9"  (1.753 m)   Wt 181 lb (82.1 kg)   SpO2 97%   BMI 26.73 kg/m   Physical Examination: General: Well developed, well nourished, NAD  HEENT: OP clear, mucus membranes moist  SKIN: warm, dry. No rashes. Neuro: No focal deficits  Musculoskeletal: Muscle strength 5/5 all ext  Psychiatric: Mood and affect normal  Neck: No JVD, no carotid bruits, no thyromegaly, no lymphadenopathy.  Lungs:Clear bilaterally, no wheezes, rhonci, crackles Cardiovascular: Regular rate and rhythm. Loud, harsh, late peaking systolic murmur.  Abdomen:Soft. Bowel sounds present. Non-tender.  Extremities: No lower extremity edema. Pulses are 2 + in the bilateral DP/PT.  Echo 03/29/18: - Left ventricle: The cavity size was normal. There was mild   concentric hypertrophy. Systolic function was normal. The   estimated ejection fraction was in the range of 60% to 65%. Wall   motion was normal; there were no regional wall motion   abnormalities. There was an increased relative contribution of   atrial contraction to ventricular filling. Doppler parameters are   consistent with abnormal left ventricular relaxation (grade 1   diastolic dysfunction). - Aortic valve: Valve mobility was restricted. There was moderate   stenosis. There was trivial  regurgitation. Mean gradient (S): 38   mm Hg. Valve area (VTI): 1.06 cm^2. Valve area (Vmax): 1.1 cm^2.   Valve area (Vmean): 1.07 cm^2. - Mitral valve: Calcified annulus. There was trivial regurgitation. - Tricuspid valve: There was mild regurgitation. - Pulmonic valve: There was trivial regurgitation.  Left ventricle:  The cavity size was normal. There was mild concentric hypertrophy. Systolic function was normal. The estimated ejection fraction was in the range of 60% to 65%. Wall motion was normal; there were no regional wall motion abnormalities. There was an increased relative contribution of atrial contraction to ventricular filling. Doppler parameters are consistent with abnormal left ventricular relaxation (grade 1 diastolic dysfunction).  ------------------------------------------------------------------- Aortic valve:   Trileaflet; severely thickened, severely calcified leaflets. Valve mobility was restricted.  Doppler:   There was moderate stenosis.   There was trivial regurgitation.    VTI ratio of LVOT to aortic valve: 0.31. Valve area (VTI): 1.06 cm^2. Indexed valve area (VTI): 0.53 cm^2/m^2. Peak velocity ratio of LVOT to aortic valve: 0.32. Valve area (Vmax): 1.1 cm^2. Indexed valve area (Vmax): 0.55 cm^2/m^2. Mean velocity ratio of LVOT to aortic valve: 0.31. Valve area (Vmean): 1.07 cm^2. Indexed valve area (Vmean): 0.53 cm^2/m^2.    Mean gradient (S): 38 mm Hg. Peak gradient (S): 63 mm Hg.  ------------------------------------------------------------------- Aorta:  Aortic root: The aortic root was normal in size.  ------------------------------------------------------------------- Mitral valve:   Calcified annulus. Mobility was not restricted. Doppler:  Transvalvular velocity was within the normal range. There was no evidence for stenosis. There was trivial regurgitation.  ------------------------------------------------------------------- Left atrium:   The atrium was normal in size.  ------------------------------------------------------------------- Right ventricle:  The cavity size was normal. Wall thickness was normal. Systolic function was normal.  ------------------------------------------------------------------- Pulmonic valve:    Structurally normal valve.   Cusp separation was normal.  Doppler:  Transvalvular velocity was within the normal range. There was no evidence for stenosis. There was trivial regurgitation.  ------------------------------------------------------------------- Tricuspid valve:   Structurally normal valve.    Doppler: Transvalvular velocity was within the normal range. There was mild regurgitation.  ------------------------------------------------------------------- Pulmonary artery:   The main pulmonary artery was normal-sized. Systolic pressure was within the normal range.  ------------------------------------------------------------------- Right atrium:  The atrium was normal in size.  ------------------------------------------------------------------- Pericardium:  There was no pericardial effusion.  ------------------------------------------------------------------- Systemic veins: Inferior vena cava: The vessel was normal in size. The respirophasic diameter changes were in the normal range (= 50%), consistent with normal central venous pressure. Diameter: 12 mm.  ------------------------------------------------------------------- Measurements   IVC                                      Value          Reference  ID                                       12    mm       ----------    Left ventricle                           Value          Reference  LV ID, ED, PLAX chordal          (L)     38.2  mm       43 - 52  LV ID, ES, PLAX chordal                  23.7  mm       23 - 38  LV fx shortening, PLAX chordal  38    %        >=29  LV PW thickness, ED                      14.3   mm       ----------  IVS/LV PW ratio, ED                      0.87           <=1.3  Stroke volume, 2D                        102   ml       ----------  Stroke volume/bsa, 2D                    51    ml/m^2   ----------  LV e&', lateral                           7.07  cm/s     ----------  LV E/e&', lateral                         8.64           ----------  LV e&', medial                            6.64  cm/s     ----------  LV E/e&', medial                          9.2            ----------  LV e&', average                           6.86  cm/s     ----------  LV E/e&', average                         8.91           ----------    Ventricular septum                       Value          Reference  IVS thickness, ED                        12.4  mm       ----------    LVOT                                     Value          Reference  LVOT ID, S                               21    mm       ----------  LVOT area  3.46  cm^2     ----------  LVOT peak velocity, S                    126   cm/s     ----------  LVOT mean velocity, S                    88.1  cm/s     ----------  LVOT VTI, S                              29.6  cm       ----------  LVOT peak gradient, S                    6     mm Hg    ----------    Aortic valve                             Value          Reference  Aortic valve peak velocity, S            396   cm/s     ----------  Aortic valve mean velocity, S            285   cm/s     ----------  Aortic valve VTI, S                      97    cm       ----------  Aortic mean gradient, S                  38    mm Hg    ----------  Aortic peak gradient, S                  63    mm Hg    ----------  VTI ratio, LVOT/AV                       0.31           ----------  Aortic valve area, VTI                   1.06  cm^2     ----------  Aortic valve area/bsa, VTI               0.53  cm^2/m^2 ----------  Velocity ratio, peak, LVOT/AV            0.32            ----------  Aortic valve area, peak velocity         1.1   cm^2     ----------  Aortic valve area/bsa, peak              0.55  cm^2/m^2 ----------  velocity  Velocity ratio, mean, LVOT/AV            0.31           ----------  Aortic valve area, mean velocity         1.07  cm^2     ----------  Aortic valve area/bsa, mean              0.53  cm^2/m^2 ----------  velocity  Aortic regurg pressure half-time  355   ms       ----------    Aorta                                    Value          Reference  Aortic root ID, ED                       31    mm       ----------    Left atrium                              Value          Reference  LA ID, A-P, ES                           36    mm       ----------  LA ID/bsa, A-P                           1.79  cm/m^2   <=2.2  LA volume, S                             51.9  ml       ----------  LA volume/bsa, S                         25.8  ml/m^2   ----------  LA volume, ES, 1-p A4C                   47.2  ml       ----------  LA volume/bsa, ES, 1-p A4C               23.5  ml/m^2   ----------  LA volume, ES, 1-p A2C                   56.7  ml       ----------  LA volume/bsa, ES, 1-p A2C               28.2  ml/m^2   ----------    Mitral valve                             Value          Reference  Mitral E-wave peak velocity              61.1  cm/s     ----------  Mitral A-wave peak velocity              88.3  cm/s     ----------  Mitral deceleration time         (H)     278   ms       150 - 230  Mitral E/A ratio, peak                   0.7            ----------    Pulmonary arteries  Value          Reference  PA pressure, S, DP                       30    mm Hg    <=30    Tricuspid valve                          Value          Reference  Tricuspid regurg peak velocity           258   cm/s     ----------  Tricuspid peak RV-RA gradient            27    mm Hg    ----------  Tricuspid maximal regurg                 258   cm/s      ----------  velocity, PISA    Right atrium                             Value          Reference  RA ID, S-I, ES, A4C                      47.6  mm       34 - 49  RA area, ES, A4C                         14.6  cm^2     8.3 - 19.5  RA volume, ES, A/L                       37.2  ml       ----------  RA volume/bsa, ES, A/L                   18.5  ml/m^2   ----------    Systemic veins                           Value          Reference  Estimated CVP                            3     mm Hg    ----------    Right ventricle                          Value          Reference  TAPSE                                    32.1  mm       ----------  RV pressure, S, DP                       30    mm Hg    <=30  RV s&', lateral, S                        15.3  cm/s     ----------  EKG:  EKG is ordered today. The ekg ordered today demonstrates NSR, rate 65 bpm. Non-specific ST abnormatlity  Recent Labs: No results found for requested labs within last 8760 hours.    Wt Readings from Last 3 Encounters:  06/14/18 181 lb (82.1 kg)  05/28/18 180 lb (81.6 kg)  03/25/18 180 lb 6.4 oz (81.8 kg)     Other studies Reviewed: Additional studies/ records that were reviewed today include: I have personally reviewed the echo images, EKG, office notes.  Review of the above records demonstrates: He has severe AS.    Assessment and Plan:   1. Severe aortic stenosis: He has moderately severe to severe aortic valve stenosis and is now having symptoms of chest pain and dyspnea as well as fatigue. I have personally reviewed the echo images. The aortic valve is thickened, calcified with limited leaflet mobility. I think he would benefit from AVR. He would be a good candidate for TAVR but will have to review with the valve team to decide between surgical AVR and TAVR. STS risk score 0.696.   STS Risk Score: Procedure: Isolated AVR  Risk of Mortality: 0.696%  Renal Failure: 0.801%  Permanent Stroke: 0.849%  Prolonged  Ventilation: 2.899%  DSW Infection: 0.082%  Reoperation: 3.370%  Morbidity or Mortality:6.339%  Short Length of Stay: 62.829%  Long Length of Stay: 1.550%   I have reviewed the natural history of aortic stenosis with the patient and their family members  who are present today. We have discussed the limitations of medical therapy and the poor prognosis associated with symptomatic aortic stenosis. We have reviewed potential treatment options, including palliative medical therapy, conventional surgical aortic valve replacement, and transcatheter aortic valve replacement. We discussed treatment options in the context of the patient's specific comorbid medical conditions.    He would like to proceed with planning for TAVR vs surgical AVR. I will arrange a right and left heart catheterization at [facility] 06/23/18 at 1pm. Risks and benefits of the cath procedure and the TAVR procedure are reviewed with the patient. After the cath, he will have a cardiac CT, CTA of the chest/abdomen and pelvis, PFTs, carotid dopplers, PT assessment and will then be referred to see one of the CT surgeons on our TAVR team.    Current medicines are reviewed at length with the patient today.  The patient does not have concerns regarding medicines.  The following changes have been made:  no change  Labs/ tests ordered today include:   Orders Placed This Encounter  Procedures  . CBC  . Basic Metabolic Panel (BMET)   Disposition:   F/U with the valve team after his cath.   Signed, Lauree Chandler, MD 06/14/2018 9:54 AM    [facility] [place], [place], [place]  [address] Phone: [phone]; Fax: [phone]

## 2018-06-14 ENCOUNTER — Ambulatory Visit (INDEPENDENT_AMBULATORY_CARE_PROVIDER_SITE_OTHER): Payer: Medicare Other | Admitting: Cardiovascular Disease

## 2018-06-14 ENCOUNTER — Encounter: Payer: Self-pay | Admitting: Cardiovascular Disease

## 2018-06-14 ENCOUNTER — Encounter (INDEPENDENT_AMBULATORY_CARE_PROVIDER_SITE_OTHER): Payer: Self-pay

## 2018-06-14 VITALS — BP 148/80 | HR 67 | Ht 69.0 in | Wt 181.0 lb

## 2018-06-14 DIAGNOSIS — I350 Nonrheumatic aortic (valve) stenosis: Secondary | ICD-10-CM

## 2018-06-14 LAB — CBC
HEMATOCRIT: 43.2 % (ref 37.5–51.0)
HEMOGLOBIN: 14.9 g/dL (ref 13.0–17.7)
MCH: 32.7 pg (ref 26.6–33.0)
MCHC: 34.5 g/dL (ref 31.5–35.7)
MCV: 95 fL (ref 79–97)
Platelets: 171 10*3/uL (ref 150–450)
RBC: 4.55 x10E6/uL (ref 4.14–5.80)
RDW: 13.4 % (ref 12.3–15.4)
WBC: 6.3 10*3/uL (ref 3.4–10.8)

## 2018-06-14 LAB — BASIC METABOLIC PANEL
BUN/Creatinine Ratio: 18 (ref 10–24)
BUN: 19 mg/dL (ref 8–27)
CALCIUM: 9.1 mg/dL (ref 8.6–10.2)
CO2: 21 mmol/L (ref 20–29)
CREATININE: 1.03 mg/dL (ref 0.76–1.27)
Chloride: 104 mmol/L (ref 96–106)
GFR, EST AFRICAN AMERICAN: 83 mL/min/{1.73_m2} (ref >59)
GFR, EST NON AFRICAN AMERICAN: 72 mL/min/{1.73_m2} (ref >59)
Glucose: 73 mg/dL (ref 65–99)
Potassium: 4.4 mmol/L (ref 3.5–5.2)
Sodium: 139 mmol/L (ref 134–144)

## 2018-06-14 NOTE — Patient Instructions (Signed)
Medication Instructions:  Your physician recommends that you continue on your current medications as directed. Please refer to the Current Medication list given to you today.   Labwork: Lab work to be done today--BMP and CBC  Testing/Procedures: Your physician has requested that you have a cardiac catheterization. Cardiac catheterization is used to diagnose and/or treat various heart conditions. Doctors may recommend this procedure for a number of different reasons. The most common reason is to evaluate chest pain. Chest pain can be a symptom of coronary artery disease (CAD), and cardiac catheterization can show whether plaque is narrowing or blocking your heart's arteries. This procedure is also used to evaluate the valves, as well as measure the blood flow and oxygen levels in different parts of your heart. For further information please visit [employer]uge[employer]iest[employer]tn. Please follow instruction sheet, as given.  Scheduled for August 7,2019  Follow-Up: To be arranged after catheterization.   Any Other Special Instructions Will Be Listed Below (If Applicable).     [facility] [address] [place] [address] Dept: [phone] Loc: [phone]  ASHAZ ROBLING  06/14/2018  You are scheduled for a Cardiac Catheterization on Wednesday, August 7 with Dr. Lauree Chandler.  1. Please arrive at the [facility] (Main Entrance A) at [facility]: [address] [place] [employer] [place], [place] [address] at 11:00 AM (This time is two hours before your procedure to ensure your preparation). Free valet parking service is available.   Special note: Every effort is made to have your procedure done on time. Please understand that emergencies sometimes delay scheduled procedures.  2. Diet: Do not eat solid foods after midnight.  The patient may have clear liquids until 5am upon the day of the  procedure.  3. Labs: Lab work done in office on July 29,2019    4. Medication instructions in preparation for your procedure:   Contrast Allergy: No   On the morning of your procedure, take your Aspirin and any morning medicines NOT listed above.  You may use sips of water.  5. Plan for one night stay--bring personal belongings. 6. Bring a current list of your medications and current insurance cards. 7. You MUST have a responsible person to drive you home. 8. Someone MUST be with you the first 24 hours after you arrive home or your discharge will be delayed. 9. Please wear clothes that are easy to get on and off and wear slip-on shoes.  Thank you for allowing [place] to care for you!   -- [facility]    If you need a refill on your cardiac medications before your next appointment, please call your pharmacy.

## 2018-06-14 NOTE — Addendum Note (Signed)
Addended by: Leodis Liverpool L on: 06/14/2018 11:30 AM   Modules accepted: Orders

## 2018-06-22 ENCOUNTER — Telehealth: Payer: Self-pay | Admitting: *Deleted

## 2018-06-22 NOTE — Telephone Encounter (Signed)
Pt contacted pre-catheterization scheduled at [facility] for: Wednesday June 23, 2018 1 PM Verified arrival time and place: [facility] Entrance A at: 11AM  No solid food after midnight prior to cath, clear liquids until 5 AM day of procedure. Verified allergies in Epic Verified no diabetes medications.  AM meds can be  taken pre-cath with sip of water including: ASA 325mg   Confirmed patient has responsible person to drive home post procedure and for 24 hours after you arrive home: yes

## 2018-06-23 ENCOUNTER — Encounter (HOSPITAL_COMMUNITY)
Admission: RE | Disposition: A | Discharge status: Home / Self Care | Payer: Self-pay | Source: Ambulatory Visit | Attending: Cardiovascular Disease

## 2018-06-23 ENCOUNTER — Ambulatory Visit (HOSPITAL_COMMUNITY)
Admission: RE | Admit: 2018-06-23 | Discharge: 2018-06-23 | Disposition: A | Discharge status: Home / Self Care | Payer: Medicare Other | Source: Ambulatory Visit | Attending: Cardiovascular Disease | Admitting: Cardiovascular Disease

## 2018-06-23 ENCOUNTER — Other Ambulatory Visit: Payer: Self-pay

## 2018-06-23 DIAGNOSIS — H9190 Unspecified hearing loss, unspecified ear: Secondary | ICD-10-CM | POA: Diagnosis not present

## 2018-06-23 DIAGNOSIS — Z9889 Other specified postprocedural states: Secondary | ICD-10-CM | POA: Diagnosis not present

## 2018-06-23 DIAGNOSIS — Z7982 Long term (current) use of aspirin: Secondary | ICD-10-CM | POA: Diagnosis not present

## 2018-06-23 DIAGNOSIS — Z8249 Family history of ischemic heart disease and other diseases of the circulatory system: Secondary | ICD-10-CM | POA: Insufficient documentation

## 2018-06-23 DIAGNOSIS — E785 Hyperlipidemia, unspecified: Secondary | ICD-10-CM | POA: Diagnosis not present

## 2018-06-23 DIAGNOSIS — I25119 Atherosclerotic heart disease of native coronary artery with unspecified angina pectoris: Secondary | ICD-10-CM | POA: Diagnosis not present

## 2018-06-23 DIAGNOSIS — I10 Essential (primary) hypertension: Secondary | ICD-10-CM | POA: Diagnosis not present

## 2018-06-23 DIAGNOSIS — H547 Unspecified visual loss: Secondary | ICD-10-CM | POA: Diagnosis not present

## 2018-06-23 DIAGNOSIS — I350 Nonrheumatic aortic (valve) stenosis: Secondary | ICD-10-CM | POA: Diagnosis not present

## 2018-06-23 DIAGNOSIS — I2510 Atherosclerotic heart disease of native coronary artery without angina pectoris: Secondary | ICD-10-CM

## 2018-06-23 DIAGNOSIS — Z85820 Personal history of malignant melanoma of skin: Secondary | ICD-10-CM | POA: Insufficient documentation

## 2018-06-23 DIAGNOSIS — Z947 Corneal transplant status: Secondary | ICD-10-CM | POA: Diagnosis not present

## 2018-06-23 DIAGNOSIS — F17290 Nicotine dependence, other tobacco product, uncomplicated: Secondary | ICD-10-CM | POA: Insufficient documentation

## 2018-06-23 HISTORY — PX: RIGHT/LEFT HEART CATH AND CORONARY ANGIOGRAPHY: CATH118266

## 2018-06-23 LAB — POCT I-STAT 3, ART BLOOD GAS (G3+)
ACID-BASE DEFICIT: 1 mmol/L (ref 0.0–2.0)
Bicarbonate: 24.2 mmol/L (ref 20.0–28.0)
O2 SAT: 99 %
PCO2 ART: 40.2 mmHg (ref 32.0–48.0)
PO2 ART: 124 mmHg — AB (ref 83.0–108.0)
TCO2: 25 mmol/L (ref 22–32)
pH, Arterial: 7.389 (ref 7.350–7.450)

## 2018-06-23 LAB — POCT I-STAT 3, VENOUS BLOOD GAS (G3P V)
Acid-base deficit: 1 mmol/L (ref 0.0–2.0)
Bicarbonate: 24.4 mmol/L (ref 20.0–28.0)
O2 Saturation: 75 %
PH VEN: 7.371 (ref 7.250–7.430)
PO2 VEN: 41 mmHg (ref 32.0–45.0)
TCO2: 26 mmol/L (ref 22–32)
pCO2, Ven: 42.1 mmHg — ABNORMAL LOW (ref 44.0–60.0)

## 2018-06-23 SURGERY — RIGHT/LEFT HEART CATH AND CORONARY ANGIOGRAPHY
Anesthesia: LOCAL

## 2018-06-23 MED ORDER — HEPARIN (PORCINE) IN NACL 1000-0.9 UT/500ML-% IV SOLN
INTRAVENOUS | Status: DC | PRN
  Administered 2018-06-23 (×2): 500 mL

## 2018-06-23 MED ORDER — MIDAZOLAM HCL 2 MG/2ML IJ SOLN
INTRAMUSCULAR | Status: AC
  Filled 2018-06-23: qty 2

## 2018-06-23 MED ORDER — HEPARIN SODIUM (PORCINE) 1000 UNIT/ML IJ SOLN
INTRAMUSCULAR | Status: AC
  Filled 2018-06-23: qty 1

## 2018-06-23 MED ORDER — FENTANYL CITRATE (PF) 100 MCG/2ML IJ SOLN
INTRAMUSCULAR | Status: DC | PRN
  Administered 2018-06-23: 50 ug via INTRAVENOUS

## 2018-06-23 MED ORDER — VERAPAMIL HCL 2.5 MG/ML IV SOLN
INTRAVENOUS | Status: AC
  Filled 2018-06-23: qty 2

## 2018-06-23 MED ORDER — SODIUM CHLORIDE 0.9% FLUSH: 3.0000 mL | Freq: Two times a day (BID) | INTRAVENOUS | Status: DC

## 2018-06-23 MED ORDER — HEPARIN (PORCINE) IN NACL 1000-0.9 UT/500ML-% IV SOLN
INTRAVENOUS | Status: AC
  Filled 2018-06-23: qty 1000

## 2018-06-23 MED ORDER — FENTANYL CITRATE (PF) 100 MCG/2ML IJ SOLN
INTRAMUSCULAR | Status: AC
  Filled 2018-06-23: qty 2

## 2018-06-23 MED ORDER — IOHEXOL 350 MG/ML SOLN
INTRAVENOUS | Status: DC | PRN
  Administered 2018-06-23: 75 mL

## 2018-06-23 MED ORDER — SODIUM CHLORIDE 0.9% FLUSH: 3.0000 mL | INTRAVENOUS | Status: DC | PRN

## 2018-06-23 MED ORDER — VERAPAMIL HCL 2.5 MG/ML IV SOLN
INTRAVENOUS | Status: DC | PRN
  Administered 2018-06-23: 10 mL via INTRA_ARTERIAL

## 2018-06-23 MED ORDER — MIDAZOLAM HCL 2 MG/2ML IJ SOLN
INTRAMUSCULAR | Status: DC | PRN
  Administered 2018-06-23: 2 mg via INTRAVENOUS

## 2018-06-23 MED ORDER — LIDOCAINE HCL (PF) 1 % IJ SOLN
INTRAMUSCULAR | Status: AC
  Filled 2018-06-23: qty 30

## 2018-06-23 MED ORDER — HEPARIN SODIUM (PORCINE) 1000 UNIT/ML IJ SOLN
INTRAMUSCULAR | Status: DC | PRN
  Administered 2018-06-23: 4000 [IU] via INTRAVENOUS

## 2018-06-23 MED ORDER — SODIUM CHLORIDE 0.9 % IV SOLN: 250.0000 mL | INTRAVENOUS | Status: DC | PRN

## 2018-06-23 MED ORDER — ASPIRIN 81 MG PO CHEW: 81.0000 mg | CHEWABLE_TABLET | ORAL | Status: DC

## 2018-06-23 MED ORDER — SODIUM CHLORIDE 0.9 % IV SOLN
INTRAVENOUS | Status: AC
  Administered 2018-06-23: 11:00:00 via INTRAVENOUS

## 2018-06-23 MED ORDER — LIDOCAINE HCL (PF) 1 % IJ SOLN
INTRAMUSCULAR | Status: DC | PRN
  Administered 2018-06-23 (×2): 2 mL

## 2018-06-23 SURGICAL SUPPLY — 13 items
CATH 5FR JL3.5 JR4 ANG PIG MP (CATHETERS) ×1 IMPLANT
CATH BALLN WEDGE 5F 110CM (CATHETERS) ×1 IMPLANT
CATH INFINITI 5FR AL1 (CATHETERS) ×1 IMPLANT
DEVICE RAD COMP TR BAND LRG (VASCULAR PRODUCTS) ×1 IMPLANT
GLIDESHEATH SLEND SS 6F .021 (SHEATH) ×1 IMPLANT
GUIDEWIRE INQWIRE 1.5J.035X260 (WIRE) IMPLANT
INQWIRE 1.5J .035X260CM (WIRE) ×2
KIT HEART LEFT (KITS) ×2 IMPLANT
PACK CARDIAC CATHETERIZATION (CUSTOM PROCEDURE TRAY) ×2 IMPLANT
SHEATH GLIDE SLENDER 4/5FR (SHEATH) ×1 IMPLANT
TRANSDUCER W/STOPCOCK (MISCELLANEOUS) ×2 IMPLANT
TUBING CIL FLEX 10 FLL-RA (TUBING) ×2 IMPLANT
WIRE EMERALD ST .035X150CM (WIRE) ×1 IMPLANT

## 2018-06-23 NOTE — Discharge Instructions (Signed)
**Note Charles Robles-identified via Obfuscation** Radial Site Care °Refer to this sheet in the next few weeks. These instructions provide you with information about caring for yourself after your procedure. Your health care provider may also give you more specific instructions. Your treatment has been planned according to current medical practices, but problems sometimes occur. Call your health care provider if you have any problems or questions after your procedure. °What can I expect after the procedure? °After your procedure, it is typical to have the following: °· Bruising at the radial site that usually fades within 1-2 weeks. °· Blood collecting in the tissue (hematoma) that may be painful to the touch. It should usually decrease in size and tenderness within 1-2 weeks. ° °Follow these instructions at home: °· Take medicines only as directed by your health care provider. °· You may shower 24-48 hours after the procedure or as directed by your health care provider. Remove the bandage (dressing) and gently wash the site with plain soap and water. Pat the area dry with a clean towel. Do not rub the site, because this may cause bleeding. °· Do not take baths, swim, or use a hot tub until your health care provider approves. °· Check your insertion site every day for redness, swelling, or drainage. °· Do not apply powder or lotion to the site. °· Do not flex or bend the affected arm for 24 hours or as directed by your health care provider. °· Do not push or pull heavy objects with the affected arm for 24 hours or as directed by your health care provider. °· Do not lift over 10 lb (4.5 kg) for 5 days after your procedure or as directed by your health care provider. °· Ask your health care provider when it is okay to: °? Return to work or school. °? Resume usual physical activities or sports. °? Resume sexual activity. °· Do not drive home if you are discharged the same day as the procedure. Have someone else drive you. °· You may drive 24 hours after the procedure  unless otherwise instructed by your health care provider. °· Do not operate machinery or power tools for 24 hours after the procedure. °· If your procedure was done as an outpatient procedure, which means that you went home the same day as your procedure, a responsible adult should be with you for the first 24 hours after you arrive home. °· Keep all follow-up visits as directed by your health care provider. This is important. °Contact a health care provider if: °· You have a fever. °· You have chills. °· You have increased bleeding from the radial site. Hold pressure on the site. °Get help right away if: °· You have unusual pain at the radial site. °· You have redness, warmth, or swelling at the radial site. °· You have drainage (other than a small amount of blood on the dressing) from the radial site. °· The radial site is bleeding, and the bleeding does not stop after 30 minutes of holding steady pressure on the site. °· Your arm or hand becomes pale, cool, tingly, or numb. °This information is not intended to replace advice given to you by your health care provider. Make sure you discuss any questions you have with your health care provider. °Document Released: 12/06/2010 Document Revised: 04/10/2016 Document Reviewed: 05/22/2014 °Elsevier Interactive Patient Education © 2018 [employer] ° °

## 2018-06-23 NOTE — Interval H&P Note (Signed)
History and Physical Interval Note:  06/23/2018 11:53 AM  Charles Robles  has presented today for cardiac cath with the diagnosis of severe AS. The various methods of treatment have been discussed with the patient and family. After consideration of risks, benefits and other options for treatment, the patient has consented to  Procedure(s): RIGHT/LEFT HEART CATH AND CORONARY ANGIOGRAPHY (N/A) as a surgical intervention .  The patient's history has been reviewed, patient examined, no change in status, stable for surgery.  I have reviewed the patient's chart and labs.  Questions were answered to the patient's satisfaction.    Cath Lab Visit (complete for each Cath Lab visit)  Clinical Evaluation Leading to the Procedure:   ACS: No.  Non-ACS:    Anginal Classification: CCS II  Anti-ischemic medical therapy: No Therapy  Non-Invasive Test Results: No non-invasive testing performed  Prior CABG: No previous CABG         Lauree Chandler

## 2018-06-24 ENCOUNTER — Encounter (HOSPITAL_COMMUNITY): Payer: Self-pay | Admitting: Cardiovascular Disease

## 2018-06-29 ENCOUNTER — Other Ambulatory Visit: Payer: Self-pay

## 2018-06-29 ENCOUNTER — Telehealth: Payer: Self-pay

## 2018-06-29 DIAGNOSIS — R42 Dizziness and giddiness: Secondary | ICD-10-CM

## 2018-06-29 DIAGNOSIS — I350 Nonrheumatic aortic (valve) stenosis: Secondary | ICD-10-CM

## 2018-06-29 DIAGNOSIS — R0789 Other chest pain: Secondary | ICD-10-CM

## 2018-06-29 NOTE — Telephone Encounter (Signed)
Pt's echo and cath reviewed by Multidisciplinary team. Due to discrepancy between echo and cath data the team feels that the pt should proceed with exercise tolerance test to assess for symptomatic aortic stenosis. Pt verbalized understanding of this recommendation and scheduled for GXT on 8/15.

## 2018-07-01 ENCOUNTER — Ambulatory Visit (INDEPENDENT_AMBULATORY_CARE_PROVIDER_SITE_OTHER): Payer: Medicare Other

## 2018-07-01 DIAGNOSIS — I350 Nonrheumatic aortic (valve) stenosis: Secondary | ICD-10-CM | POA: Diagnosis not present

## 2018-07-01 DIAGNOSIS — R42 Dizziness and giddiness: Secondary | ICD-10-CM

## 2018-07-01 DIAGNOSIS — R0789 Other chest pain: Secondary | ICD-10-CM

## 2018-07-01 LAB — EXERCISE TOLERANCE TEST
CSEPED: 6 min
CSEPEDS: 42 s
CSEPHR: 88 %
CSEPPHR: 130 {beats}/min
Estimated workload: 3.8 METS
MPHR: 147 {beats}/min
RPE: 15
Rest HR: 74 {beats}/min

## 2018-07-01 NOTE — Telephone Encounter (Signed)
Thanks

## 2018-07-09 ENCOUNTER — Telehealth: Payer: Self-pay | Admitting: Cardiovascular Disease

## 2018-07-09 NOTE — Telephone Encounter (Signed)
I spoke to Dr. Roselie Awkward this morning on the phone. His AS is felt to be moderately severe but not yet in a severe range. He feels well overall. Will plan to see him back in the valve clinic in 4 months to reassess.   Lauren, Can you set him up to see me on a valve clinic day or just a clinic day mid to end of December? Thanks, chris

## 2018-07-13 NOTE — Telephone Encounter (Signed)
I spoke with the pt and scheduled him for follow-up with Dr Angelena Form on 10/18/2018 at 10:40 AM. The pt will contact the office with any additional questions or concerns.

## 2018-08-04 ENCOUNTER — Telehealth: Payer: Self-pay

## 2018-08-04 NOTE — Telephone Encounter (Signed)
Charles Blanks, MD  Barkley Boards, RN        I think Viagra would be perfectly acceptable. 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    Thanks,  Lauren        I spoke with the pt and made him aware of Dr Camillia Herter response.

## 2018-10-18 ENCOUNTER — Encounter: Payer: Self-pay | Admitting: Cardiovascular Disease

## 2018-10-18 ENCOUNTER — Ambulatory Visit (INDEPENDENT_AMBULATORY_CARE_PROVIDER_SITE_OTHER): Payer: Medicare Other | Admitting: Cardiovascular Disease

## 2018-10-18 VITALS — BP 152/88 | HR 73 | Ht 69.0 in | Wt 177.8 lb

## 2018-10-18 DIAGNOSIS — I350 Nonrheumatic aortic (valve) stenosis: Secondary | ICD-10-CM

## 2018-10-18 NOTE — Patient Instructions (Signed)
Medication Instructions:  Your physician recommends that you continue on your current medications as directed. Please refer to the Current Medication list given to you today.  If you need a refill on your cardiac medications before your next appointment, please call your pharmacy.   Lab work: none If you have labs (blood work) drawn today and your tests are completely normal, you will receive your results only by: . MyChart Message (if you have MyChart) OR . A paper copy in the mail If you have any lab test that is abnormal or we need to change your treatment, we will call you to review the results.  Testing/Procedures: Your physician has requested that you have an echocardiogram. Echocardiography is a painless test that uses sound waves to create images of your heart. It provides your doctor with information about the size and shape of your heart and how well your heart's chambers and valves are working. This procedure takes approximately one hour. There are no restrictions for this procedure.    Follow-Up: At [facility], you and your health needs are our priority.  As part of our continuing mission to provide you with exceptional heart care, we have created designated Provider Care Teams.  These Care Teams include your primary Cardiologist (physician) and Advanced Practice Providers (APPs -  Physician Assistants and Nurse Practitioners) who all work together to provide you with the care you need, when you need it. You will need a follow up appointment in 6 months.  Please call our office 2 months in advance to schedule this appointment.  You may see Dr. McAlhany  or one of the following Advanced Practice Providers on your designated Care Team:   Brittainy Simmons, PA-C Dayna Dunn, PA-C . Michele Lenze, PA-C  Any Other Special Instructions Will Be Listed Below (If Applicable).     

## 2018-10-18 NOTE — Progress Notes (Signed)
Valve Clinic Note  Chief Complaint  Patient presents with  . 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 He is followed in our office by Dr. Radford Pax. I saw him in the valve clinic as a new consult July 2019 for further evaluation of his aortic stenosis and discussion regarding possible TAVR. He has been followed for moderate aortic stenosis for several years. No known CAD. Echo May 2019 with LVEF=60-65%. The aortic valve is thickened and calcified with limited mobility. The mean gradient across the valve is 38 mmHg, peak gradient 63 mmHg. AVA 1.06 cm2. DVI 0.31. His aortic stenosis was felt to be moderate at that time. He was seen by Dr. Marlou Porch in May 2019 but had no symptoms at that time. He then sought a second opinion regarding his AS from Dr. Radford Pax on 7/123/19. I arranged a cardiac cath on 8/729 which showed mild CAD. Mean gradient by cath was 15.7 mmHg. Exercise tolerance test August 2019 with good exercise tolerance and no EKG changes. We elected to follow his aortic stenosis at that time.   He is here today for follow up. He tells me that he gets pressure in his chest at rest. He feels pressure when he wakes up in the night. He has progressive dyspnea with exertion. He has more fatigue. He has occasional dizziness. No palpitations, lower extremity edema, orthopnea, PND, near syncope or syncope.   Primary Care Physician: Forrest Moron, MD Primary Cardiologist: Stanton Kidney  Past Medical History:  Diagnosis Date  . Abnormal large bowel motility   . Allergy   . Aortic stenosis   . Cancer ([place])    melanoma  . Cerumen impaction bilaterally  . Hearing loss   . Hyperlipidemia   . Hypertension   . Other fatigue   . Routine general medical examination at a health care facility   . Stool incontinence   . Urgency of urination   . Vision  loss of right eye     Past Surgical History:  Procedure Laterality Date  . COLONOSCOPY  2017  . EYE SURGERY  1981    Right eye- corneal transplant  . HERNIA REPAIR     x3  . POLYPECTOMY    . RIGHT/LEFT HEART CATH AND CORONARY ANGIOGRAPHY N/A 06/23/2018   Procedure: RIGHT/LEFT HEART CATH AND CORONARY ANGIOGRAPHY;  Surgeon: Burnell Blanks, MD;  Location: [place] [facility];  Service: Cardiovascular;  Laterality: N/A;  . TONSILLECTOMY      Current Outpatient Medications  Medication Sig Dispense Refill  . aspirin 325 MG tablet Take 325 mg by mouth daily.    . cetirizine (ZYRTEC) 10 MG tablet Take 10 mg by mouth daily as needed for allergies.    . fish oil-omega-3 fatty acids 1000 MG capsule Take 1 g by mouth daily.     . Glucosamine 500 MG CAPS Take 1 capsule by mouth daily.    . Menaquinone-7 (VITAMIN K2 PO) Take 1 capsule by mouth daily.    . Pseudoephedrine HCl (SUDAFED 12 HOUR PO) Take 1 tablet by mouth daily as needed.     No current facility-administered medications for this visit.     No Known Allergies  Social History   Socioeconomic History  . Marital status: Divorced    Spouse name: Not on file  . Number of children: 0  .  Years of education: Not on file  . Highest education level: Not on file  Occupational History  . Occupation: Retired-Psychologist  Social Needs  . Financial resource strain: Not on file  . Food insecurity:    Worry: Not on file    Inability: Not on file  . Transportation needs:    Medical: Not on file    Non-medical: Not on file  Tobacco Use  . Smoking status: Current Some Day Smoker    Types: Cigars  . Smokeless tobacco: Never Used  . Tobacco comment: occ pipe  Substance and Sexual Activity  . Alcohol use: Yes    Alcohol/week: 15.0 standard drinks    Types: 3 Glasses of wine, 12 Cans of beer per week    Comment: 12 beers per week & 1 bottle wine per week per pt  . Drug use: Yes    Types: Marijuana    Comment: marijuana daily    . Sexual activity: Never  Lifestyle  . Physical activity:    Days per week: Not on file    Minutes per session: Not on file  . Stress: Not on file  Relationships  . Social connections:    Talks on phone: Not on file    Gets together: Not on file    Attends religious service: Not on file    Active member of club or organization: Not on file    Attends meetings of clubs or organizations: Not on file    Relationship status: Not on file  . Intimate partner violence:    Fear of current or ex partner: Not on file    Emotionally abused: Not on file    Physically abused: Not on file    Forced sexual activity: Not on file  Other Topics Concern  . Not on file  Social History Narrative  . Not on file    Family History  Problem Relation Age of Onset  . Heart disease Father        MI  . Hyperlipidemia Father   . Hypertension Mother   . Alcohol abuse Mother   . Hyperlipidemia Brother   . Hypertension Brother   . Colon cancer Neg Hx   . Colon polyps Neg Hx   . Rectal cancer Neg Hx   . Stomach cancer Neg Hx   . Esophageal cancer Neg Hx     Review of Systems:  As stated in the HPI and otherwise negative.   BP (!) 152/88   Pulse 73   Ht 5\' 9"  (1.753 m)   Wt 177 lb 12.8 oz (80.6 kg)   SpO2 97%   BMI 26.26 kg/m   Physical Examination:  General: Well developed, well nourished, NAD  HEENT: OP clear, mucus membranes moist  SKIN: warm, dry. No rashes. Neuro: No focal deficits  Musculoskeletal: Muscle strength 5/5 all ext  Psychiatric: Mood and affect normal  Neck: No JVD, no carotid bruits, no thyromegaly, no lymphadenopathy.  Lungs:Clear bilaterally, no wheezes, rhonci, crackles Cardiovascular: Regular rate and rhythm. Loud, harsh, late peaking systolic murmur.  Abdomen:Soft. Bowel sounds present. Non-tender.  Extremities: No lower extremity edema. Pulses are 2 + in the bilateral DP/PT.    Echo 03/29/18: - Left ventricle: The cavity size was normal. There was mild    concentric hypertrophy. Systolic function was normal. The   estimated ejection fraction was in the range of 60% to 65%. Wall   motion was normal; there were no regional wall motion   abnormalities. There was  an increased relative contribution of   atrial contraction to ventricular filling. Doppler parameters are   consistent with abnormal left ventricular relaxation (grade 1   diastolic dysfunction). - Aortic valve: Valve mobility was restricted. There was moderate   stenosis. There was trivial regurgitation. Mean gradient (S): 38   mm Hg. Valve area (VTI): 1.06 cm^2. Valve area (Vmax): 1.1 cm^2.   Valve area (Vmean): 1.07 cm^2. - Mitral valve: Calcified annulus. There was trivial regurgitation. - Tricuspid valve: There was mild regurgitation. - Pulmonic valve: There was trivial regurgitation.  Left ventricle:  The cavity size was normal. There was mild concentric hypertrophy. Systolic function was normal. The estimated ejection fraction was in the range of 60% to 65%. Wall motion was normal; there were no regional wall motion abnormalities. There was an increased relative contribution of atrial contraction to ventricular filling. Doppler parameters are consistent with abnormal left ventricular relaxation (grade 1 diastolic dysfunction).  ------------------------------------------------------------------- Aortic valve:   Trileaflet; severely thickened, severely calcified leaflets. Valve mobility was restricted.  Doppler:   There was moderate stenosis.   There was trivial regurgitation.    VTI ratio of LVOT to aortic valve: 0.31. Valve area (VTI): 1.06 cm^2. Indexed valve area (VTI): 0.53 cm^2/m^2. Peak velocity ratio of LVOT to aortic valve: 0.32. Valve area (Vmax): 1.1 cm^2. Indexed valve area (Vmax): 0.55 cm^2/m^2. Mean velocity ratio of LVOT to aortic valve: 0.31. Valve area (Vmean): 1.07 cm^2. Indexed valve area (Vmean): 0.53 cm^2/m^2.    Mean gradient (S): 38 mm Hg. Peak  gradient (S): 63 mm Hg.  ------------------------------------------------------------------- Aorta:  Aortic root: The aortic root was normal in size.  ------------------------------------------------------------------- Mitral valve:   Calcified annulus. Mobility was not restricted. Doppler:  Transvalvular velocity was within the normal range. There was no evidence for stenosis. There was trivial regurgitation.  ------------------------------------------------------------------- Left atrium:  The atrium was normal in size.  ------------------------------------------------------------------- Right ventricle:  The cavity size was normal. Wall thickness was normal. Systolic function was normal.  ------------------------------------------------------------------- Pulmonic valve:    Structurally normal valve.   Cusp separation was normal.  Doppler:  Transvalvular velocity was within the normal range. There was no evidence for stenosis. There was trivial regurgitation.  ------------------------------------------------------------------- Tricuspid valve:   Structurally normal valve.    Doppler: Transvalvular velocity was within the normal range. There was mild regurgitation.  ------------------------------------------------------------------- Pulmonary artery:   The main pulmonary artery was normal-sized. Systolic pressure was within the normal range.  ------------------------------------------------------------------- Right atrium:  The atrium was normal in size.  ------------------------------------------------------------------- Pericardium:  There was no pericardial effusion.  ------------------------------------------------------------------- Systemic veins: Inferior vena cava: The vessel was normal in size. The respirophasic diameter changes were in the normal range (= 50%), consistent with normal central venous pressure. Diameter: 12  mm.  ------------------------------------------------------------------- Measurements   IVC                                      Value          Reference  ID                                       12    mm       ----------    Left ventricle  Value          Reference  LV ID, ED, PLAX chordal          (L)     38.2  mm       43 - 52  LV ID, ES, PLAX chordal                  23.7  mm       23 - 38  LV fx shortening, PLAX chordal           38    %        >=29  LV PW thickness, ED                      14.3  mm       ----------  IVS/LV PW ratio, ED                      0.87           <=1.3  Stroke volume, 2D                        102   ml       ----------  Stroke volume/bsa, 2D                    51    ml/m^2   ----------  LV e&', lateral                           7.07  cm/s     ----------  LV E/e&', lateral                         8.64           ----------  LV e&', medial                            6.64  cm/s     ----------  LV E/e&', medial                          9.2            ----------  LV e&', average                           6.86  cm/s     ----------  LV E/e&', average                         8.91           ----------    Ventricular septum                       Value          Reference  IVS thickness, ED                        12.4  mm       ----------    LVOT  Value          Reference  LVOT ID, S                               21    mm       ----------  LVOT area                                3.46  cm^2     ----------  LVOT peak velocity, S                    126   cm/s     ----------  LVOT mean velocity, S                    88.1  cm/s     ----------  LVOT VTI, S                              29.6  cm       ----------  LVOT peak gradient, S                    6     mm Hg    ----------    Aortic valve                             Value          Reference  Aortic valve peak velocity, S            396   cm/s      ----------  Aortic valve mean velocity, S            285   cm/s     ----------  Aortic valve VTI, S                      97    cm       ----------  Aortic mean gradient, S                  38    mm Hg    ----------  Aortic peak gradient, S                  63    mm Hg    ----------  VTI ratio, LVOT/AV                       0.31           ----------  Aortic valve area, VTI                   1.06  cm^2     ----------  Aortic valve area/bsa, VTI               0.53  cm^2/m^2 ----------  Velocity ratio, peak, LVOT/AV            0.32           ----------  Aortic valve area, peak velocity         1.1   cm^2     ----------  Aortic valve area/bsa, peak  0.55  cm^2/m^2 ----------  velocity  Velocity ratio, mean, LVOT/AV            0.31           ----------  Aortic valve area, mean velocity         1.07  cm^2     ----------  Aortic valve area/bsa, mean              0.53  cm^2/m^2 ----------  velocity  Aortic regurg pressure half-time         355   ms       ----------    Aorta                                    Value          Reference  Aortic root ID, ED                       31    mm       ----------    Left atrium                              Value          Reference  LA ID, A-P, ES                           36    mm       ----------  LA ID/bsa, A-P                           1.79  cm/m^2   <=2.2  LA volume, S                             51.9  ml       ----------  LA volume/bsa, S                         25.8  ml/m^2   ----------  LA volume, ES, 1-p A4C                   47.2  ml       ----------  LA volume/bsa, ES, 1-p A4C               23.5  ml/m^2   ----------  LA volume, ES, 1-p A2C                   56.7  ml       ----------  LA volume/bsa, ES, 1-p A2C               28.2  ml/m^2   ----------    Mitral valve                             Value          Reference  Mitral E-wave peak velocity              61.1  cm/s     ----------  Mitral A-wave peak velocity  88.3   cm/s     ----------  Mitral deceleration time         (H)     278   ms       150 - 230  Mitral E/A ratio, peak                   0.7            ----------    Pulmonary arteries                       Value          Reference  PA pressure, S, DP                       30    mm Hg    <=30    Tricuspid valve                          Value          Reference  Tricuspid regurg peak velocity           258   cm/s     ----------  Tricuspid peak RV-RA gradient            27    mm Hg    ----------  Tricuspid maximal regurg                 258   cm/s     ----------  velocity, PISA    Right atrium                             Value          Reference  RA ID, S-I, ES, A4C                      47.6  mm       34 - 49  RA area, ES, A4C                         14.6  cm^2     8.3 - 19.5  RA volume, ES, A/L                       37.2  ml       ----------  RA volume/bsa, ES, A/L                   18.5  ml/m^2   ----------    Systemic veins                           Value          Reference  Estimated CVP                            3     mm Hg    ----------    Right ventricle                          Value          Reference  TAPSE  32.1  mm       ----------  RV pressure, S, DP                       30    mm Hg    <=30  RV s&', lateral, S                        15.3  cm/s     ----------  EKG:  EKG is not ordered today. The ekg ordered today demonstrates   Recent Labs: 06/14/2018: BUN 19; Creatinine, Ser 1.03; Hemoglobin 14.9; Platelets 171; Potassium 4.4; Sodium 139    Wt Readings from Last 3 Encounters:  10/18/18 177 lb 12.8 oz (80.6 kg)  06/23/18 181 lb (82.1 kg)  06/14/18 181 lb (82.1 kg)     Other studies Reviewed: Additional studies/ records that were reviewed today include: I have personally reviewed the echo images, EKG, office notes.  Review of the above records demonstrates: He has severe AS.    Assessment and Plan:   1. Aortic stenosis: He has  moderately severe aortic stenosis by echo in May 2019 and moderate AS by cath data from August 2019. We put his workup for TAVR on hold in August 2019 after his cath and normal exercise tolerance test. His aortic stenosis was not felt to be severe at that time. He tells me today that he has been anxious thinking about his valve disease. He does c/o worsening dyspnea with exertion with rare resting chest pains. No fluid retention. He is still very active. He has ongoing fatigue. He thinks his anxiety about his valve is playing a role in his symptoms. He would like to move forward with valve replacement. I will repeat an echo now to assess his aortic valve. If his valve disease has progressed, we would move forward with planning for TAVR as his symptoms seem to be worsening.     Current medicines are reviewed at length with the patient today.  The patient does not have concerns regarding medicines.  The following changes have been made:  no change  Labs/ tests ordered today include:   Orders Placed This Encounter  Procedures  . ECHOCARDIOGRAM COMPLETE   Disposition:   F/U with me in 6 months. If his valve disease has progressed, would move forward with TAVR workup  Signed, Lauree Chandler, MD 10/18/2018 11:37 AM    [facility] [address], [place], [place]  [id number] Phone: [phone]; Fax: [phone]

## 2018-10-26 ENCOUNTER — Other Ambulatory Visit: Payer: Self-pay

## 2018-10-26 ENCOUNTER — Ambulatory Visit (HOSPITAL_COMMUNITY): Payer: Medicare Other | Attending: Internal Medicine

## 2018-10-26 DIAGNOSIS — I350 Nonrheumatic aortic (valve) stenosis: Secondary | ICD-10-CM | POA: Diagnosis not present

## 2018-11-23 ENCOUNTER — Telehealth: Payer: Self-pay | Admitting: Family Medicine

## 2018-11-23 NOTE — Telephone Encounter (Signed)
L/m for pt regarding overdue for CPE.  Requested  that pt contact office to have scheduled. °

## 2019-02-11 ENCOUNTER — Other Ambulatory Visit: Payer: Self-pay

## 2019-02-11 DIAGNOSIS — Z130 Encounter for screening for diseases of the blood and blood-forming organs and certain disorders involving the immune mechanism: Secondary | ICD-10-CM

## 2019-02-11 DIAGNOSIS — Z13220 Encounter for screening for lipoid disorders: Secondary | ICD-10-CM

## 2019-02-11 DIAGNOSIS — Z1389 Encounter for screening for other disorder: Secondary | ICD-10-CM

## 2019-02-11 DIAGNOSIS — Z1329 Encounter for screening for other suspected endocrine disorder: Secondary | ICD-10-CM

## 2019-02-11 DIAGNOSIS — Z13228 Encounter for screening for other metabolic disorders: Secondary | ICD-10-CM

## 2019-02-17 ENCOUNTER — Ambulatory Visit (INDEPENDENT_AMBULATORY_CARE_PROVIDER_SITE_OTHER): Payer: Medicare Other | Admitting: Family Medicine

## 2019-02-17 ENCOUNTER — Encounter: Payer: Self-pay | Admitting: Family Medicine

## 2019-02-17 ENCOUNTER — Other Ambulatory Visit: Payer: Self-pay

## 2019-02-17 VITALS — BP 164/80 | HR 69 | Temp 98.3°F | Resp 18 | Ht 68.0 in | Wt 176.4 lb

## 2019-02-17 DIAGNOSIS — Z0001 Encounter for general adult medical examination with abnormal findings: Secondary | ICD-10-CM

## 2019-02-17 DIAGNOSIS — Z0000 Encounter for general adult medical examination without abnormal findings: Secondary | ICD-10-CM

## 2019-02-17 DIAGNOSIS — I10 Essential (primary) hypertension: Secondary | ICD-10-CM | POA: Diagnosis not present

## 2019-02-17 DIAGNOSIS — M19011 Primary osteoarthritis, right shoulder: Secondary | ICD-10-CM | POA: Diagnosis not present

## 2019-02-17 DIAGNOSIS — I350 Nonrheumatic aortic (valve) stenosis: Secondary | ICD-10-CM

## 2019-02-17 DIAGNOSIS — Z1211 Encounter for screening for malignant neoplasm of colon: Secondary | ICD-10-CM | POA: Diagnosis not present

## 2019-02-17 DIAGNOSIS — E785 Hyperlipidemia, unspecified: Secondary | ICD-10-CM

## 2019-02-17 DIAGNOSIS — Z86010 Personal history of colonic polyps: Secondary | ICD-10-CM

## 2019-02-17 MED ORDER — LISINOPRIL 20 MG PO TABS: 20.0000 mg | ORAL_TABLET | Freq: Every day | ORAL | 3 refills | Status: DC

## 2019-02-17 MED ORDER — DICLOFENAC SODIUM 1 % TD GEL: 2.0000 g | Freq: Four times a day (QID) | TRANSDERMAL | 1 refills | Status: DC

## 2019-02-17 NOTE — Progress Notes (Signed)
Presents today for [employer] Visit-Subsequent.   Date of last exam: 03/11/2017  Interpreter used for this visit? n/a  Patient Care Team: Forrest Moron, MD as PCP - General (Internal Medicine) Jerline Pain, MD as PCP - Cardiology (Cardiology)   Other items to address today:  Saw cards in Dec 2019 - follow-up on mod-sev AS, has close followup Right shoulder pain flare up after moving boxes. ibu hurts his stomach, lidocaine patches not sticking on.   Cancer Screening: Cervical: n/a Breast: n/a Colon: colonoscopy July 2017, multiple polyps, has not had followup colonoscopy Prostate: 2018   Other Screening: Last screening for diabetes: 2018 Last lipid screening: HLP  Lab Results  Component Value Date   CHOL 306 (H) 03/11/2017   HDL 87 03/11/2017   LDLCALC 187 (H) 03/11/2017   TRIG 161 (H) 03/11/2017   CHOLHDL 3.5 03/11/2017  used to take cholesterol medication but stopped for no specific reasons Brother also has high cholesterol, no MI Father died of MI at age 67 Normal stress test in 2019 Mild non-obstructive CAD per cath in 2019  Smokes cigar Drinks about 3-4 beers a day  ADVANCE DIRECTIVES: Discussed: yes, he has forms Patient desires CPR, mechanical ventilation (No ), prolonged artificial support (may include mechanical ventilation, tube/PEG feeding, etc) (No ). On File: no Materials Provided: no  Immunization status:   There is no immunization history on file for this patient.   Health Maintenance Due  Topic Date Due  . TETANUS/TDAP  05/29/1964  . PNA vac Low Risk Adult (1 of 2 - PCV13) 05/29/2010  . COLONOSCOPY  05/22/2017     Functional Status Survey: Is the patient deaf or have difficulty hearing?: No Does the patient have difficulty seeing, even when wearing glasses/contacts?: No Does the patient have difficulty concentrating, remembering, or making decisions?: Yes Does the patient have difficulty walking or climbing  stairs?: No Does the patient have difficulty dressing or bathing?: No Does the patient have difficulty doing errands alone such as visiting a doctor's office or shopping?: No  6CIT Screen 02/17/2019  What Year? 0 points  What month? 0 points  What time? 0 points  Count back from 20 0 points  Months in reverse 0 points  Repeat phrase 0 points  Total Score 0     Patient Active Problem List   Diagnosis Date Noted  . Low back pain 10/06/2016  . Hyperlipidemia 01/17/2016  . Tarsal tunnel syndrome of right side 07/11/2015  . Rotator cuff tear arthropathy of right shoulder 08/29/2014  . Arthritis of right acromioclavicular joint 08/29/2014  . Severe aortic stenosis 05/23/2014  . Heart murmur 05/23/2014  . 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 Pain in joint, ankle and foot 02/15/2014  . Right hip pain 02/03/2013  . HTN (hypertension) 02/12/2012  . Neck pain on right side 02/11/2012  . Knee pain, left 10/29/2011  . Tear of lateral meniscus of knee joint 10/29/2011     Past Medical History:  Diagnosis Date  . Abnormal large bowel motility   . Allergy   . Aortic stenosis   . Cancer ([place])    melanoma  . Cerumen impaction bilaterally  . Hearing loss   . Hyperlipidemia   . Hypertension   . Other fatigue   . Routine general medical examination at a health care facility   . Stool incontinence   . Urgency of urination   . Vision loss of right eye  Past Surgical History:  Procedure Laterality Date  . COLONOSCOPY  2017  . EYE SURGERY  1981    Right eye- corneal transplant  . HERNIA REPAIR     x3  . POLYPECTOMY    . RIGHT/LEFT HEART CATH AND CORONARY ANGIOGRAPHY N/A 06/23/2018   Procedure: RIGHT/LEFT HEART CATH AND CORONARY ANGIOGRAPHY;  Surgeon: Burnell Blanks, MD;  Location: [facility];  Service: Cardiovascular;  Laterality: N/A;  . TONSILLECTOMY       Family History  Problem Relation Age of Onset  . Heart  disease Father        MI  . Hyperlipidemia Father   . Hypertension Mother   . Alcohol abuse Mother   . Hyperlipidemia Brother   . Hypertension Brother   . Colon cancer Neg Hx   . Colon polyps Neg Hx   . Rectal cancer Neg Hx   . Stomach cancer Neg Hx   . Esophageal cancer Neg Hx      Social History   Socioeconomic History  . Marital status: Divorced    Spouse name: Not on file  . Number of children: 0  . Years of education: Not on file  . Highest education level: Not on file  Occupational History  . Occupation: Retired-Psychologist  Social Needs  . Financial resource strain: Not on file  . Food insecurity:    Worry: Not on file    Inability: Not on file  . Transportation needs:    Medical: Not on file    Non-medical: Not on file  Tobacco Use  . Smoking status: Current Some Day Smoker    Types: Cigars  . Smokeless tobacco: Never Used  . Tobacco comment: occ pipe  Substance and Sexual Activity  . Alcohol use: Yes    Alcohol/week: 15.0 standard drinks    Types: 3 Glasses of wine, 12 Cans of beer per week    Comment: 12 beers per week & 1 bottle wine per week per pt  . Drug use: Yes    Types: Marijuana    Comment: marijuana daily   . Sexual activity: Never  Lifestyle  . Physical activity:    Days per week: Not on file    Minutes per session: Not on file  . Stress: Not on file  Relationships  . Social connections:    Talks on phone: Not on file    Gets together: Not on file    Attends religious service: Not on file    Active member of club or organization: Not on file    Attends meetings of clubs or organizations: Not on file    Relationship status: Not on file  . Intimate partner violence:    Fear of current or ex partner: Not on file    Emotionally abused: Not on file    Physically abused: Not on file    Forced sexual activity: Not on file  Other Topics Concern  . Not on file  Social History Narrative  . Not on file     No Known Allergies   Prior  to Admission medications   Medication Sig Start Date End Date Taking? Authorizing Provider  aspirin 325 MG tablet Take 325 mg by mouth daily.   Yes [provider]  cetirizine (ZYRTEC) 10 MG tablet Take 10 mg by mouth daily as needed for allergies.   Yes [provider]  fish oil-omega-3 fatty acids 1000 MG capsule Take 1 g by mouth daily.    Yes  [provider]  Glucosamine 500 MG CAPS Take 1 capsule by mouth daily.   Yes [provider]  Pseudoephedrine HCl (SUDAFED 12 HOUR PO) Take 1 tablet by mouth daily as needed.   Yes [provider]  Menaquinone-7 (VITAMIN K2 PO) Take 1 capsule by mouth daily.    [provider]     Depression screen [facility] 2/9 02/17/2019 03/25/2017 03/18/2017 03/11/2017 10/06/2016  Decreased Interest 0 0 0 0 0  Down, Depressed, Hopeless 0 0 0 0 0  PHQ - 2 Score 0 0 0 0 0  Altered sleeping - - - - -  Tired, decreased energy - - - - -  Change in appetite - - - - -  Feeling bad or failure about yourself  - - - - -  Trouble concentrating - - - - -  Moving slowly or fidgety/restless - - - - -  Suicidal thoughts - - - - -  PHQ-9 Score - - - - -     Fall Risk  02/17/2019 03/25/2017 03/18/2017 03/11/2017 10/06/2016  Falls in the past year? 0 No No No No  Number falls in past yr: - - - - -  Injury with Fall? - - - - -  Risk for fall due to : - - - - Other (Comment)  Risk for fall due to: Comment - - - - -      PHYSICAL EXAM: BP (!) 181/84   Pulse 69   Temp 98.3 F (36.8 C) (Oral)   Resp 18   Ht 5\' 8"  (1.727 m)   Wt 176 lb 6.4 oz (80 kg)   SpO2 96%   BMI 26.82 kg/m    BP Readings from Last 3 Encounters:  02/17/19 (!) 164/80  10/18/18 (!) 152/88  06/23/18 (!) 159/77    Wt Readings from Last 3 Encounters:  02/17/19 176 lb 6.4 oz (80 kg)  10/18/18 177 lb 12.8 oz (80.6 kg)  06/23/18 181 lb (82.1 kg)     No exam data present    Physical Exam   Education/Counseling provided regarding diet and exercise,  prevention of chronic diseases, smoking/tobacco cessation, if applicable, and reviewed "Covered Medicare Preventive Services."   ASSESSMENT/PLAN: 1. Encounter for Medicare annual wellness exam Routine HCM labs ordered. HCM reviewed/discussed. Anticipatory guidance regarding healthy weight, lifestyle and choices given.   2. Colon cancer screening 3. History of colonic polyps - Ambulatory referral to Gastroenterology  4. Dyslipidemia Checking labs today. Consider restarting statin. - Comprehensive metabolic panel; Future - Lipid panel; Future  5. Arthritis of right acromioclavicular joint - diclofenac sodium (VOLTAREN) 1 % GEL; Apply 2 g topically 4 (four) times daily.  6. Essential hypertension Uncontrolled. Starting lisinopril today. Reviewed r/se/b. - lisinopril (PRINIVIL,ZESTRIL) 20 MG tablet; Take 1 tablet (20 mg total) by mouth daily.  7. Severe aortic stenosis Followed closely by cards. Stable sx wise - CBC; Future  Other orders

## 2019-02-17 NOTE — Patient Instructions (Signed)
° ° ° °  If you have lab work done today you will be contacted with your lab results within the next 2 weeks.  If you have not heard from us then please contact us. The fastest way to get your results is to register for My Chart. ° ° °IF you received an x-ray today, you will receive an invoice from [facility]. Please contact  Radiology at [phone] with questions or concerns regarding your invoice.  ° °IF you received labwork today, you will receive an invoice from [facility]. Please contact [facility] at [phone] with questions or concerns regarding your invoice.  ° °Our billing staff will not be able to assist you with questions regarding bills from these companies. ° °You will be contacted with the lab results as soon as they are available. The fastest way to get your results is to activate your My Chart account. Instructions are located on the last page of this paperwork. If you have not heard from us regarding the results in 2 weeks, please contact this office. °  ° ° ° °

## 2019-02-21 ENCOUNTER — Ambulatory Visit (INDEPENDENT_AMBULATORY_CARE_PROVIDER_SITE_OTHER): Payer: Medicare Other | Admitting: Family Medicine

## 2019-02-21 ENCOUNTER — Other Ambulatory Visit: Payer: Self-pay

## 2019-02-21 DIAGNOSIS — Z13228 Encounter for screening for other metabolic disorders: Secondary | ICD-10-CM

## 2019-02-21 DIAGNOSIS — I350 Nonrheumatic aortic (valve) stenosis: Secondary | ICD-10-CM

## 2019-02-21 DIAGNOSIS — E785 Hyperlipidemia, unspecified: Secondary | ICD-10-CM

## 2019-02-21 DIAGNOSIS — Z1389 Encounter for screening for other disorder: Secondary | ICD-10-CM | POA: Diagnosis not present

## 2019-02-21 DIAGNOSIS — Z130 Encounter for screening for diseases of the blood and blood-forming organs and certain disorders involving the immune mechanism: Secondary | ICD-10-CM | POA: Diagnosis not present

## 2019-02-21 DIAGNOSIS — Z1329 Encounter for screening for other suspected endocrine disorder: Secondary | ICD-10-CM

## 2019-02-21 DIAGNOSIS — Z13220 Encounter for screening for lipoid disorders: Secondary | ICD-10-CM

## 2019-02-22 LAB — CBC WITH DIFFERENTIAL/PLATELET
Basophils Absolute: 0 10*3/uL (ref 0.0–0.2)
Basos: 1 %
EOS (ABSOLUTE): 0.2 10*3/uL (ref 0.0–0.4)
Eos: 4 %
Hematocrit: 46.1 % (ref 37.5–51.0)
Hemoglobin: 15.1 g/dL (ref 13.0–17.7)
Immature Grans (Abs): 0 10*3/uL (ref 0.0–0.1)
Immature Granulocytes: 0 %
Lymphocytes Absolute: 1.8 10*3/uL (ref 0.7–3.1)
Lymphs: 32 %
MCH: 31.7 pg (ref 26.6–33.0)
MCHC: 32.8 g/dL (ref 31.5–35.7)
MCV: 97 fL (ref 79–97)
Monocytes Absolute: 0.6 10*3/uL (ref 0.1–0.9)
Monocytes: 10 %
Neutrophils Absolute: 3.1 10*3/uL (ref 1.4–7.0)
Neutrophils: 53 %
Platelets: 186 10*3/uL (ref 150–450)
RBC: 4.77 x10E6/uL (ref 4.14–5.80)
RDW: 13 % (ref 11.6–15.4)
WBC: 5.8 10*3/uL (ref 3.4–10.8)

## 2019-02-22 LAB — CMP14+EGFR
ALT: 21 IU/L (ref 0–44)
AST: 18 IU/L (ref 0–40)
Albumin/Globulin Ratio: 2 (ref 1.2–2.2)
Albumin: 4.5 g/dL (ref 3.7–4.7)
Alkaline Phosphatase: 61 IU/L (ref 39–117)
BUN/Creatinine Ratio: 13 (ref 10–24)
BUN: 14 mg/dL (ref 8–27)
Bilirubin Total: 0.6 mg/dL (ref 0.0–1.2)
CO2: 21 mmol/L (ref 20–29)
Calcium: 9.2 mg/dL (ref 8.6–10.2)
Chloride: 101 mmol/L (ref 96–106)
Creatinine, Ser: 1.1 mg/dL (ref 0.76–1.27)
GFR calc Af Amer: 77 mL/min/{1.73_m2} (ref >59)
GFR calc non Af Amer: 66 mL/min/{1.73_m2} (ref >59)
Globulin, Total: 2.3 g/dL (ref 1.5–4.5)
Glucose: 91 mg/dL (ref 65–99)
Potassium: 4.5 mmol/L (ref 3.5–5.2)
Sodium: 141 mmol/L (ref 134–144)
Total Protein: 6.8 g/dL (ref 6.0–8.5)

## 2019-02-22 LAB — URINALYSIS, DIPSTICK ONLY
Bilirubin, UA: NEGATIVE
Glucose, UA: NEGATIVE
Ketones, UA: NEGATIVE
Leukocytes,UA: NEGATIVE
Nitrite, UA: NEGATIVE
RBC, UA: NEGATIVE
Specific Gravity, UA: 1.029 (ref 1.005–1.030)
Urobilinogen, Ur: 0.2 mg/dL (ref 0.2–1.0)
pH, UA: 5.5 (ref 5.0–7.5)

## 2019-02-22 LAB — LIPID PANEL
Chol/HDL Ratio: 3.1 ratio (ref 0.0–5.0)
Cholesterol, Total: 275 mg/dL — ABNORMAL HIGH (ref 100–199)
HDL: 89 mg/dL (ref >39)
LDL Calculated: 161 mg/dL — ABNORMAL HIGH (ref 0–99)
Triglycerides: 125 mg/dL (ref 0–149)
VLDL Cholesterol Cal: 25 mg/dL (ref 5–40)

## 2019-02-22 LAB — TSH: TSH: 1.34 u[IU]/mL (ref 0.450–4.500)

## 2019-02-23 ENCOUNTER — Telehealth: Payer: Self-pay | Admitting: Physician Assistant

## 2019-02-23 NOTE — Telephone Encounter (Signed)
° °  4/8 :   Left patient a VM to return my call regarding 4/14 office visit needs to be change to virtual visit.

## 2019-02-24 NOTE — Telephone Encounter (Signed)
°  4/9 :  Left another VM for patient to return my call.

## 2019-02-24 NOTE — Telephone Encounter (Signed)
°  4/9 :  Patient returned call.   Patient does not have Internet access. No smartphone.  No MyChart / Phone visit

## 2019-02-25 ENCOUNTER — Telehealth: Payer: Self-pay | Admitting: *Deleted

## 2019-02-25 NOTE — Telephone Encounter (Signed)
LVMOM TO CALL BACK TO GIVE CONSENT FOR TLEPHONE VISIT

## 2019-03-01 ENCOUNTER — Telehealth: Payer: Medicare Other | Admitting: Physician Assistant

## 2019-03-11 MED ORDER — ATORVASTATIN CALCIUM 40 MG PO TABS: 40.0000 mg | ORAL_TABLET | Freq: Every day | ORAL | 3 refills | Status: DC

## 2019-03-11 NOTE — Addendum Note (Signed)
Addended by: Rutherford Guys on: 03/11/2019 02:05 PM   Modules accepted: Orders

## 2019-03-29 ENCOUNTER — Other Ambulatory Visit: Payer: Self-pay

## 2019-03-29 DIAGNOSIS — I350 Nonrheumatic aortic (valve) stenosis: Secondary | ICD-10-CM

## 2019-04-22 ENCOUNTER — Telehealth (HOSPITAL_COMMUNITY): Payer: Self-pay | Admitting: *Deleted

## 2019-04-22 NOTE — Telephone Encounter (Signed)
COVID-19 Pre-Screening Questions:  . Do you currently have a fever? NO (yes = cancel and refer to pcp for e-visit) . Have you recently travelled on a cruise, internationally, or to [place], [place], [place], [place], [place], or [place], [place] ([place]) ? NO (yes = cancel, stay home, monitor symptoms, and contact pcp or initiate e-visit if symptoms develop) . Have you been in contact with someone that is currently pending confirmation of Covid19 testing or has been confirmed to have the Covid19 virus?  NO (yes = cancel, stay home, away from tested individual, monitor symptoms, and contact pcp or initiate e-visit if symptoms develop) . Are you currently experiencing fatigue or cough? NO (yes = pt should be prepared to have a mask placed at the time of their visit).   . Reiterated no additional visitors. . Arrive no earlier than 15 minutes before appointment time. . Please bring own mask.  Pleasant [place] 

## 2019-04-22 NOTE — Telephone Encounter (Signed)
Left VM to call back  [facility]

## 2019-04-25 ENCOUNTER — Other Ambulatory Visit: Payer: Self-pay

## 2019-04-25 ENCOUNTER — Ambulatory Visit (HOSPITAL_COMMUNITY): Payer: Medicare Other | Attending: Cardiology

## 2019-04-25 DIAGNOSIS — I350 Nonrheumatic aortic (valve) stenosis: Secondary | ICD-10-CM

## 2019-04-27 ENCOUNTER — Telehealth: Payer: Self-pay

## 2019-04-27 DIAGNOSIS — H52203 Unspecified astigmatism, bilateral: Secondary | ICD-10-CM | POA: Diagnosis not present

## 2019-04-27 DIAGNOSIS — H2513 Age-related nuclear cataract, bilateral: Secondary | ICD-10-CM | POA: Diagnosis not present

## 2019-04-27 NOTE — Telephone Encounter (Signed)
-----   Message from Burnell Blanks, MD sent at 04/27/2019 10:57 AM EDT ----- Echo now shows severe aortic stenosis. I will need to add him to a virtual clinic on Monday June 15th if he can do that. Thanks, chris

## 2019-04-27 NOTE — Telephone Encounter (Signed)
LMTCB... appt 05/02/19

## 2019-04-29 ENCOUNTER — Encounter: Payer: Self-pay | Admitting: Gastroenterology

## 2019-05-02 ENCOUNTER — Encounter: Payer: Self-pay | Admitting: Cardiovascular Disease

## 2019-05-02 ENCOUNTER — Other Ambulatory Visit: Payer: Self-pay

## 2019-05-02 ENCOUNTER — Telehealth: Payer: Self-pay | Admitting: *Deleted

## 2019-05-02 ENCOUNTER — Ambulatory Visit (INDEPENDENT_AMBULATORY_CARE_PROVIDER_SITE_OTHER): Payer: Medicare Other | Admitting: Cardiovascular Disease

## 2019-05-02 VITALS — BP 126/76 | HR 71 | Ht 68.0 in | Wt 175.4 lb

## 2019-05-02 DIAGNOSIS — I350 Nonrheumatic aortic (valve) stenosis: Secondary | ICD-10-CM

## 2019-05-02 LAB — BASIC METABOLIC PANEL
BUN/Creatinine Ratio: 12 (ref 10–24)
BUN: 14 mg/dL (ref 8–27)
CO2: 23 mmol/L (ref 20–29)
Calcium: 9.2 mg/dL (ref 8.6–10.2)
Chloride: 102 mmol/L (ref 96–106)
Creatinine, Ser: 1.13 mg/dL (ref 0.76–1.27)
GFR calc Af Amer: 74 mL/min/{1.73_m2} (ref >59)
GFR calc non Af Amer: 64 mL/min/{1.73_m2} (ref >59)
Glucose: 95 mg/dL (ref 65–99)
Potassium: 4.7 mmol/L (ref 3.5–5.2)
Sodium: 137 mmol/L (ref 134–144)

## 2019-05-02 NOTE — Telephone Encounter (Signed)
    COVID-19 Pre-Screening Questions:  . In the past 7 to 10 days have you had a cough,  shortness of breath, headache, congestion, fever (100 or greater) body aches, chills, sore throat, or sudden loss of taste or sense of smell? no . Have you been around anyone with known Covid 19.-no . Have you been around anyone who is awaiting Covid 19 test results in the past 7 to 10 days? no . Have you been around anyone who has been exposed to Covid 19, or has mentioned symptoms of Covid 19 within the past 7 to 10 days?no  Pt aware to wear mask to appointment

## 2019-05-02 NOTE — Progress Notes (Signed)
Structural Heart Clinic Note  Chief Complaint  Patient presents with  . 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 He is followed in our office by Dr. Radford Pax. I saw him in the valve clinic as a new consult July 2019 for further evaluation of his aortic stenosis and discussion regarding possible TAVR. He has been followed for moderate aortic stenosis for several years. No known CAD. Echo May 2019 with LVEF=60-65%. The aortic valve is thickened and calcified with limited mobility. The mean gradient across the valve is 38 mmHg, peak gradient 63 mmHg. AVA 1.06 cm2. DVI 0.31. His aortic stenosis was felt to be moderate at that time. He was seen by Dr. Marlou Porch in May 2019 but had no symptoms at that time. He then sought a second opinion regarding his AS from Dr. Radford Pax on 7/123/19. Cardiac cath August 2019 with mild CAD. Mean gradient across the aortic valve by cath was 15.7 mmHg. Exercise tolerance test August 2019 with good exercise tolerance and no EKG changes. We elected to follow his aortic stenosis at that time. Most recent echo  June 2020 with worsening of his aortic valve disease with mean gradient of 45 mmHg. Normal LV systolic function with FBPZ=02-58%, severe LVH.   He is here today for follow up. He has had progressive dyspnea with exertion and fatigue. The patient denies any palpitations, lower extremity edema, orthopnea, PND, near syncope or syncope. He does have mild dizziness and occasional chest pain.     Primary Care Physician: Forrest Moron, MD Primary Cardiologist: Stanton Kidney  Past Medical History:  Diagnosis Date  . Abnormal large bowel motility   . Allergy   . Aortic stenosis   . Cancer ([place])    melanoma  . Cerumen impaction bilaterally  . Hearing loss   . Hyperlipidemia   . Hypertension   . Other fatigue   .  Routine general medical examination at a health care facility   . Stool incontinence   . Urgency of urination   . Vision loss of right eye     Past Surgical History:  Procedure Laterality Date  . COLONOSCOPY  2017  . EYE SURGERY  1981    Right eye- corneal transplant  . HERNIA REPAIR     x3  . POLYPECTOMY    . RIGHT/LEFT HEART CATH AND CORONARY ANGIOGRAPHY N/A 06/23/2018   Procedure: RIGHT/LEFT HEART CATH AND CORONARY ANGIOGRAPHY;  Surgeon: Burnell Blanks, MD;  Location: [facility];  Service: Cardiovascular;  Laterality: N/A;  . TONSILLECTOMY      Current Outpatient Medications  Medication Sig Dispense Refill  . aspirin 325 MG tablet Take 325 mg by mouth daily.    Marland Kitchen atorvastatin (LIPITOR) 40 MG tablet Take 1 tablet (40 mg total) by mouth daily. 90 tablet 3  . cetirizine (ZYRTEC) 10 MG tablet Take 10 mg by mouth daily as needed for allergies.    . fish oil-omega-3 fatty acids 1000 MG capsule Take 1 g by mouth daily.     . Glucosamine 500 MG CAPS Take 1 capsule by mouth daily.    Marland Kitchen lisinopril (PRINIVIL,ZESTRIL) 20 MG tablet Take 1 tablet (20 mg total) by mouth daily. 90 tablet 3  . Pseudoephedrine HCl (SUDAFED 12 HOUR PO) Take 1 tablet by mouth daily as needed.     No current  facility-administered medications for this visit.     No Known Allergies  Social History   Socioeconomic History  . Marital status: Divorced    Spouse name: Not on file  . Number of children: 0  . Years of education: Not on file  . Highest education level: Not on file  Occupational History  . Occupation: Retired-Psychologist  Social Needs  . Financial resource strain: Not on file  . Food insecurity    Worry: Not on file    Inability: Not on file  . Transportation needs    Medical: Not on file    Non-medical: Not on file  Tobacco Use  . Smoking status: Current Some Day Smoker    Types: Cigars  . Smokeless tobacco: Never Used  . Tobacco comment: occ pipe  Substance and Sexual  Activity  . Alcohol use: Yes    Alcohol/week: 15.0 standard drinks    Types: 3 Glasses of wine, 12 Cans of beer per week    Comment: 12 beers per week & 1 bottle wine per week per pt  . Drug use: Yes    Types: Marijuana    Comment: marijuana daily   . Sexual activity: Never  Lifestyle  . Physical activity    Days per week: Not on file    Minutes per session: Not on file  . Stress: Not on file  Relationships  . Social Herbalist on phone: Not on file    Gets together: Not on file    Attends religious service: Not on file    Active member of club or organization: Not on file    Attends meetings of clubs or organizations: Not on file    Relationship status: Not on file  . Intimate partner violence    Fear of current or ex partner: Not on file    Emotionally abused: Not on file    Physically abused: Not on file    Forced sexual activity: Not on file  Other Topics Concern  . Not on file  Social History Narrative  . Not on file    Family History  Problem Relation Age of Onset  . Heart disease Father        MI  . Hyperlipidemia Father   . Hypertension Mother   . Alcohol abuse Mother   . Hyperlipidemia Brother   . Hypertension Brother   . Colon cancer Neg Hx   . Colon polyps Neg Hx   . Rectal cancer Neg Hx   . Stomach cancer Neg Hx   . Esophageal cancer Neg Hx     Review of Systems:  As stated in the HPI and otherwise negative.   BP 126/76   Pulse 71   Ht 5\' 8"  (1.727 m)   Wt 175 lb 6.4 oz (79.6 kg)   SpO2 98%   BMI 26.67 kg/m   Physical Examination: General: Well developed, well nourished, NAD  HEENT: OP clear, mucus membranes moist  SKIN: warm, dry. No rashes. Neuro: No focal deficits  Musculoskeletal: Muscle strength 5/5 all ext  Psychiatric: Mood and affect normal  Neck: No JVD, no carotid bruits, no thyromegaly, no lymphadenopathy.  Lungs:Clear bilaterally, no wheezes, rhonci, crackles Cardiovascular: Regular rate and rhythm. Loud, harsh  systolic murmur.  Abdomen:Soft. Bowel sounds present. Non-tender.  Extremities: No lower extremity edema. Pulses are 2 + in the bilateral DP/PT.  Echo 04/25/19:  1. The left ventricle has normal systolic function with an ejection fraction of 60-65%. The  cavity size was normal. Severe basal septal hypertrophy. Left ventricular diastolic Doppler parameters are consistent with impaired relaxation. No evidence of  left ventricular regional wall motion abnormalities.  2. The right ventricle has normal systolic function. The cavity was normal. There is no increase in right ventricular wall thickness.  3. There is mild mitral annular calcification present.  4. The aortic valve is tricuspid. Severely thickening of the aortic valve. Severe calcifcation of the aortic valve. Aortic valve regurgitation was not assessed by color flow Doppler. Severe stenosis of the aortic valve. LVOT/AV VTI ratio: 0.39. AV Mean  Grad: 45.0 mmHg. AV Vmax: 461.00 cm/s.  5. The interatrial septum appears to be lipomatous.  6. Compared to prior echo of 10/2018, AS appears to have progressed with mean AVG increased from 19mmHg to 11mmHg. Although the dimensionless index is still in the moderate range, the peak velocity and mean gradient are consistent with severe AS.  FINDINGS  Left Ventricle: The left ventricle has normal systolic function, with an ejection fraction of 60-65%. The cavity size was normal. Severe basal septal hypertrophy. Left ventricular diastolic Doppler parameters are consistent with impaired relaxation.  Normal left ventricular filling pressures No evidence of left ventricular regional wall motion abnormalities..  Right Ventricle: The right ventricle has normal systolic function. The cavity was normal. There is no increase in right ventricular wall thickness.  Left Atrium: Left atrial size was normal in size.  Right Atrium: Right atrial size was normal in size. Right atrial pressure is estimated at 10 mmHg.   Interatrial Septum: No atrial level shunt detected by color flow Doppler. Increased thickness of the atrial septum sparing the fossa ovalis consistent with The interatrial septum appears to be lipomatous.  Pericardium: There is no evidence of pericardial effusion.  Mitral Valve: The mitral valve is normal in structure. There is mild mitral annular calcification present. Mitral valve regurgitation is trivial by color flow Doppler.  Tricuspid Valve: The tricuspid valve is normal in structure. Tricuspid valve regurgitation is trivial by color flow Doppler.  Aortic Valve: The aortic valve is tricuspid Severely thickening of the aortic valve. Severe calcifcation of the aortic valve, with severely decreased cusp excursion. Aortic valve regurgitation was not assessed by color flow Doppler. There is Severe  stenosis of the aortic valve, with a calculated valve area of 1.22 cm.  Pulmonic Valve: The pulmonic valve was normal in structure. Pulmonic valve regurgitation is trivial by color flow Doppler.  Venous: The inferior vena cava is normal in size with greater than 50% respiratory variability.    +--------------+--------++ LEFT VENTRICLE         +----------------+---------++ +--------------+--------++ Diastology                PLAX 2D                +----------------+---------++ +--------------+--------++ LV e' lateral:  8.16 cm/s LVIDd:        3.10 cm  +----------------+---------++ +--------------+--------++ LV E/e' lateral:8.2       LVIDs:        1.90 cm  +----------------+---------++ +--------------+--------++ LV e' medial:   7.72 cm/s LV PW:        1.20 cm  +----------------+---------++ +--------------+--------++ LV E/e' medial: 8.7       LV IVS:       1.80 cm  +----------------+---------++ +--------------+--------++ LVOT diam:    2.00 cm  +--------------+--------++ LV SV:        27 ml    +--------------+--------++ LV SV  Index:  13.55    +--------------+--------++ LVOT Area:    3.14 cm +--------------+--------++                        +--------------+--------++  +---------------+----------++ RIGHT VENTRICLE           +---------------+----------++ RV Basal diam: 1.69 cm    +---------------+----------++ RV S prime:    14.70 cm/s +---------------+----------++ TAPSE (M-mode):2.4 cm     +---------------+----------++ RVSP:          31.9 mmHg  +---------------+----------++  +---------------+-------++-----------++ LEFT ATRIUM           Index       +---------------+-------++-----------++ LA diam:       3.30 cm1.70 cm/m  +---------------+-------++-----------++ LA Vol (A2C):  32.4 ml16.72 ml/m +---------------+-------++-----------++ LA Vol (A4C):  41.7 ml21.52 ml/m +---------------+-------++-----------++ LA Biplane Vol:37.9 ml19.56 ml/m +---------------+-------++-----------++ +------------+---------++-----------++ RIGHT ATRIUM         Index       +------------+---------++-----------++ RA Pressure:3.00 mmHg            +------------+---------++-----------++ RA Area:    11.80 cm            +------------+---------++-----------++ RA Volume:  22.80 ml 11.77 ml/m +------------+---------++-----------++  +------------------+------------++ AORTIC VALVE                   +------------------+------------++ AV Area (Vmax):   1.04 cm     +------------------+------------++ AV Area (Vmean):  1.14 cm     +------------------+------------++ AV Vmax:          461.00 cm/s  +------------------+------------++ AV Vmean:         310.000 cm/s +------------------+------------++ AV VTI:           0.921 m      +------------------+------------++ AV Peak Grad:     85.0 mmHg    +------------------+------------++ AV Mean Grad:     45.0 mmHg    +------------------+------------++ LVOT Vmax:         153.00 cm/s  +------------------+------------++ LVOT Vmean:       112.000 cm/s +------------------+------------++ LVOT VTI:         0.358 m      +------------------+------------++ LVOT/AV VTI ratio:0.39         +------------------+------------++   +-------------+-------++ AORTA                +-------------+-------++ Ao Root diam:3.10 cm +-------------+-------++  +--------------+--------++   +---------------+-----------++ MITRAL VALVE             TRICUSPID VALVE            +--------------+--------++   +---------------+-----------++ MV Area (PHT):cm        TR Peak grad:  28.9 mmHg   +--------------+--------++   +---------------+-----------++ MV PHT:       msec       TR Vmax:       269.00 cm/s +--------------+--------++   +---------------+-----------++ MV Decel Time:393 msec   Estimated RAP: 3.00 mmHg   +--------------+--------++   +---------------+-----------++ +--------------+----------++ RVSP:          31.9 mmHg   MV E velocity:67.30 cm/s +---------------+-----------++ +--------------+----------++ MV A velocity:94.70 cm/s +--------------+-------+ +--------------+----------++ SHUNTS                MV E/A ratio: 0.71       +--------------+-------+ +--------------+----------++ Systemic VTI: 0.36 m                               +--------------+-------+  Systemic Diam:2.00 cm                              +--------------+-------+   Cardiac cath August 2019:  Prox RCA to Mid RCA lesion is 20% stenosed.  Prox LAD to Mid LAD lesion is 20% stenosed.  Prox Cx lesion is 30% stenosed.  Mid Cx lesion is 30% stenosed.   1. Mild non-obstructive CAD 2. Severe aortic stenosis by echo. By cath, mean gradient 15.7 mmHg, peak to peak gradient 21 mmHg, AVA 1.45 cm2,   EKG:  EKG is ordered today. The ekg ordered today demonstrates NSR, rate 71 bpm.   Recent Labs: 02/21/2019:  ALT 21; BUN 14; Creatinine, Ser 1.10; Hemoglobin 15.1; Platelets 186; Potassium 4.5; Sodium 141; TSH 1.340    Wt Readings from Last 3 Encounters:  05/02/19 175 lb 6.4 oz (79.6 kg)  02/17/19 176 lb 6.4 oz (80 kg)  10/18/18 177 lb 12.8 oz (80.6 kg)     Other studies Reviewed: Additional studies/ records that were reviewed today include: I have personally reviewed the echo images, EKG, office notes.  Review of the above records demonstrates: He has severe AS.    Assessment and Plan:   1. Aortic stenosis: He has severe, stage D aortic valve stenosis. I have personally reviewed the echo images. The aortic valve is thickened, calcified with limited leaflet mobility. I think he would benefit from AVR. Given advanced age, he is not a good candidate for conventional AVR by surgical approach. I think he may be a good candidate for TAVR.   STS Risk Score:  Procedure: Isolated AVR  Risk of Mortality: 0.696%  Renal Failure: 0.801%  Permanent Stroke: 0.849%  Prolonged Ventilation: 2.899%  DSW Infection: 0.082%  Reoperation: 3.370%  Morbidity or Mortality:6.339%  Short Length of Stay: 62.829%  Long Length of Stay: 1.550%   I have reviewed the natural history of aortic stenosis with the patient today. We have discussed the limitations of medical therapy and the poor prognosis associated with symptomatic aortic stenosis. We have reviewed potential treatment options, including palliative medical therapy, conventional surgical aortic valve replacement, and transcatheter aortic valve replacement. We discussed treatment options in the context of the patient's specific comorbid medical conditions.   He would like to proceed with planning for TAVR.  Risks and benefits of the TAVR procedure reviewed with the patient. We will arrange a gated cardiac CT, CTA of the chest/abdomen and pelvis, PFTs, carotid dopplers and PT assessment. He will then be referred to see one of the CT surgeons on our TAVR team.     Current medicines are reviewed at length with the patient today.  The patient does not have concerns regarding medicines.  The following changes have been made:  no change  Labs/ tests ordered today include:   No orders of the defined types were placed in this encounter.  Disposition:   Follow up with the valve team.  Signed, Lauree Chandler, MD 05/02/2019 10:33 AM    [address], [place], [place]  [address] Phone: [phone]; Fax: [phone]

## 2019-05-02 NOTE — Telephone Encounter (Signed)
F/U Message           Patient is returning Mardene Celeste call wants a call back, he said he would leave in 10 mins coming for his appt.

## 2019-05-02 NOTE — Patient Instructions (Signed)
Medication Instructions:  Your physician recommends that you continue on your current medications as directed. Please refer to the Current Medication list given to you today.  If you need a refill on your cardiac medications before your next appointment, please call your pharmacy.   Lab work: Lab work to be done today--BMET If you have labs (blood work) drawn today and your tests are completely normal, you will receive your results only by: Marland Kitchen MyChart Message (if you have MyChart) OR . A paper copy in the mail If you have any lab test that is abnormal or we need to change your treatment, we will call you to review the results.  Testing/Procedures: none  Follow-Up: We will contact you to schedule follow up

## 2019-05-02 NOTE — Telephone Encounter (Signed)
I placed call to pt to do pre office visit screening questions. Left message to call back

## 2019-05-03 ENCOUNTER — Other Ambulatory Visit: Payer: Self-pay

## 2019-05-03 DIAGNOSIS — I350 Nonrheumatic aortic (valve) stenosis: Secondary | ICD-10-CM

## 2019-05-03 DIAGNOSIS — R0609 Other forms of dyspnea: Secondary | ICD-10-CM

## 2019-05-05 ENCOUNTER — Other Ambulatory Visit: Payer: Self-pay

## 2019-05-05 MED ORDER — METOPROLOL TARTRATE 50 MG PO TABS: ORAL_TABLET | ORAL | 0 refills | Status: DC

## 2019-05-12 ENCOUNTER — Other Ambulatory Visit: Payer: Self-pay

## 2019-05-12 ENCOUNTER — Encounter (HOSPITAL_COMMUNITY): Payer: Self-pay

## 2019-05-12 ENCOUNTER — Ambulatory Visit (HOSPITAL_BASED_OUTPATIENT_CLINIC_OR_DEPARTMENT_OTHER)
Admission: RE | Admit: 2019-05-12 | Discharge: 2019-05-12 | Disposition: A | Discharge status: Home / Self Care | Payer: Medicare Other | Source: Ambulatory Visit | Attending: Cardiovascular Disease | Admitting: Cardiovascular Disease

## 2019-05-12 ENCOUNTER — Ambulatory Visit (HOSPITAL_COMMUNITY)
Admission: RE | Admit: 2019-05-12 | Discharge: 2019-05-12 | Disposition: A | Discharge status: Home / Self Care | Payer: Medicare Other | Source: Ambulatory Visit | Attending: Cardiovascular Disease | Admitting: Cardiovascular Disease

## 2019-05-12 DIAGNOSIS — R0609 Other forms of dyspnea: Secondary | ICD-10-CM | POA: Diagnosis not present

## 2019-05-12 DIAGNOSIS — I350 Nonrheumatic aortic (valve) stenosis: Secondary | ICD-10-CM

## 2019-05-12 DIAGNOSIS — I358 Other nonrheumatic aortic valve disorders: Secondary | ICD-10-CM | POA: Diagnosis not present

## 2019-05-12 MED ORDER — IOHEXOL 350 MG/ML SOLN
95.0000 mL | Freq: Once | INTRAVENOUS | Status: AC | PRN
  Administered 2019-05-12: 11:00:00 95 mL via INTRAVENOUS

## 2019-05-12 NOTE — Progress Notes (Signed)
Carotid duplex has been completed.   Preliminary results in CV Proc.   Abram Sander 05/12/2019 9:22 AM

## 2019-05-17 ENCOUNTER — Telehealth: Payer: Self-pay

## 2019-05-17 NOTE — Telephone Encounter (Signed)
This patient is schedule to see you in the office 05/24/19 @ 3:50. Thanks for your prompt response!   Riki Sheer, LPN ( PV )

## 2019-05-17 NOTE — Telephone Encounter (Signed)
Thanks for noticing. He needs a clinic visit with me first prior to booking at the hospital, if you can help coordinate. Thanks

## 2019-05-17 NOTE — Telephone Encounter (Signed)
Dr. Havery Moros and Osvaldo Angst,   Dr. Roselie Awkward has a PV appointment on 05/19/19. His colonoscopy is scheduled for 06/02/19. Please review Echo report on 04/25/19. Per [facility] guidelines this patient may need to have his procedure at the hospital. Please advise. Thanks!  Riki Sheer, LPN ( PV )

## 2019-05-17 NOTE — Telephone Encounter (Signed)
Charles Robles,  Yes, due to his severe AO stenosis, his procedure needs to be done in the hospital.  Thanks,  Osvaldo Angst

## 2019-05-24 ENCOUNTER — Ambulatory Visit: Payer: Medicare Other | Admitting: Gastroenterology

## 2019-06-02 ENCOUNTER — Encounter: Payer: Medicare Other | Admitting: Gastroenterology

## 2019-06-02 ENCOUNTER — Telehealth: Payer: Self-pay | Admitting: Gastroenterology

## 2019-06-02 NOTE — Telephone Encounter (Signed)
Covid-19 screening questions   Do you now or have you had a fever in the last 14 days?  Do you have any respiratory symptoms of shortness of breath or cough now or in the last 14 days?  Do you have any family members or close contacts with diagnosed or suspected Covid-19 in the past 14 days?  Have you been tested for Covid-19 and found to be positive?       

## 2019-06-03 ENCOUNTER — Encounter

## 2019-06-03 ENCOUNTER — Ambulatory Visit (INDEPENDENT_AMBULATORY_CARE_PROVIDER_SITE_OTHER): Payer: Medicare Other | Admitting: Nurse Practitioner

## 2019-06-03 ENCOUNTER — Other Ambulatory Visit: Payer: Self-pay

## 2019-06-03 ENCOUNTER — Encounter: Payer: Self-pay | Admitting: Nurse Practitioner

## 2019-06-03 VITALS — BP 126/90 | HR 84 | Temp 98.6°F | Ht 67.0 in | Wt 170.5 lb

## 2019-06-03 DIAGNOSIS — I350 Nonrheumatic aortic (valve) stenosis: Secondary | ICD-10-CM

## 2019-06-03 DIAGNOSIS — Z86010 Personal history of colonic polyps: Secondary | ICD-10-CM | POA: Diagnosis not present

## 2019-06-03 NOTE — Progress Notes (Signed)
ASSESSMENT / PLAN:   73 yo male with history of colon polyps including removal of a high risk risk in April 2017. He is overdue for follow up colonoscopy. Patient's AS has advance from moderate to severe degree increasing his risk for procedures / sedation. He is undergoing evaluation for TAVR.   -I spoke with Dr. Havery Moros today. The safest option may be to postpone colonoscopy until after TAVR unless Cardiology anticipating need for prolonged anti-coagulation post procedure. Will communicate with Cardiology and let patient know.   HPI:    Chief Complaint:   Hx of colon polyps  Patient is a 73 year old male with hyperlipidemia, hypertension and severe aortic stenosis. He was seen by Dr. Havery Moros late April 2017 for colon cancer screening.  After discussion regarding options for screening, patient opted to have a Cologuard.  His Cologuard study returned positive and he subsequently underwent colonoscopy with removal of several polyps including a 25 mm ascending sessile serrated adenoma (no cytologic dysplasia). Plan was for a 3 month recall colonoscopy but it was around that time that he became more symptomatic from AS. We reached out to him a few times, finally got scheduled for colonoscopy in Dec 2018 but he had problems getting bowel prep and and cancelled procedure. We reached out again, rescheduled procedure for June 2020 but by that time his moderate AS had progressed to severe AS and he was not longer able to have the colonoscopy done at the [facility]. He is here now for further evaluation.   Patient is being evaluated for TAVR. He is quite symptomatic, easily becomes short of breath. From a GI standpoint he hasn't seen any blood in his stool but he does admit to some bowel changes. Bowel movements more erratic. He has been having more frequent BMs than normal. Stools on loose side but not diarrhea.  No recent antibiotics or med changes. No fevers, abdominal pain, nausea or  vomiting. No unusual weight loss.    Data Reviewed:   05/02/19 BMET normal.   03/07/19  CBC normal.    Echo 04/25/19 IMPRESSIONS   1. The left ventricle has normal systolic function with an ejection fraction of 60-65%. The cavity size was normal. Severe basal septal hypertrophy. Left ventricular diastolic Doppler parameters are consistent with impaired relaxation. No evidence of  left ventricular regional wall motion abnormalities.  2. The right ventricle has normal systolic function. The cavity was normal. There is no increase in right ventricular wall thickness.  3. There is mild mitral annular calcification present.  4. The aortic valve is tricuspid. Severely thickening of the aortic valve. Severe calcifcation of the aortic valve. Aortic valve regurgitation was not assessed by color flow Doppler. Severe stenosis of the aortic valve. LVOT/AV VTI ratio: 0.39. AV Mean  Grad: 45.0 mmHg. AV Vmax: 461.00 cm/s.  5. The interatrial septum appears to be lipomatous.  6. Compared to prior echo of 10/2018, AS appears to have progressed with mean AVG increased from 51mmHg to 49mmHg. Although the dimensionless index is still in the moderate range, the peak velocity and mean gradient are consistent with severe   Most recent Endoscopic studies:  7 / 2017 Screening colonoscopy:  Nodular mucosa in the cecum. Suspect benign lymphoid aggregates but biopsied to ensure no adenomatous change. - One 25 mm polyp in the ascending colon, removed piecemeal using a cold snare. Resected and retrieved. Tattooed. - One 8 mm polyp  in the ascending colon, removed with a cold snare. Resected and retrieved. - Three 3 to 5 mm polyps at the hepatic flexure, removed with a cold snare. Resected and retrieved. - One 10 mm polyp in the transverse colon, removed with a cold snare. Resected and retrieved. - One 5 mm polyp in the transverse colon, removed with a cold snare. Resected and retrieved. - One 3 mm polyp in the  rectum, removed with a cold snare. Resected and retrieved. - Diverticulosis in the sigmoid colon. - Non-bleeding internal hemorrhoids. - The examined portion of the ileum was normal. - The examination was otherwise normal.  Surgical [P], hepatic flexure, ascending, transverse, rectal, polyp (7) - TUBULAR ADENOMA, THREE FRAGMENTS. - HYPERPLASTIC POLYP, TWO FRAGMENTS. - LEIOMYOMA, ONE FRAGMENT. - BENIGN COLORECTAL MUCOSA, REMAINING TISSUE. - NO HIGH GRADE DYSPLASIA OR MALIGNANCY IDENTIFIED. 2. Surgical [P], cecum, polyp - HYPERPLASTIC POLYP (X 1). - NO DYSPLASIA OR MALIGNANCY IDENTIFIED. 3. Surgical [P], ascending, polyp - TUBULAR ADENOMA, ONE FRAGMENT. - FRAGMENTS OF SESSILE SERRATED ADENOMA/POLYP WITHOUT CYTOLOGIC DYSPLASIA. - FRAGMENTS OF BENIGN COLORECTAL MUCOSA. - NO HIGH GRADE DYSPLASIA OR MALIGNANCY IDENTIFIED. - SEE COMMENT.     Past Medical History:  Diagnosis Date  . Abnormal large bowel motility   . Allergy   . Aortic stenosis   . Cerumen impaction bilaterally  . Hearing loss   . Hyperlipidemia   . Hypertension   . Melanoma ([place])    melanoma  . Other fatigue   . Routine general medical examination at a health care facility   . Stool incontinence   . Urgency of urination   . Vision loss of right eye      Past Surgical History:  Procedure Laterality Date  . COLONOSCOPY  2017  . EYE SURGERY  1981    Right eye- corneal transplant  . HERNIA REPAIR     x3  . POLYPECTOMY    . RIGHT/LEFT HEART CATH AND CORONARY ANGIOGRAPHY N/A 06/23/2018   Procedure: RIGHT/LEFT HEART CATH AND CORONARY ANGIOGRAPHY;  Surgeon: Burnell Blanks, MD;  Location: [facility];  Service: Cardiovascular;  Laterality: N/A;  . TONSILLECTOMY     Family History  Problem Relation Age of Onset  . Heart disease Father        MI  . Hyperlipidemia Father   . Hypertension Mother   . Alcohol abuse Mother   . Hyperlipidemia Brother   . Hypertension Brother   . Colon cancer Neg  Hx   . Colon polyps Neg Hx   . Rectal cancer Neg Hx   . Stomach cancer Neg Hx   . Esophageal cancer Neg Hx    Social History   Tobacco Use  . Smoking status: Current Some Day Smoker    Types: Cigars  . Smokeless tobacco: Never Used  . Tobacco comment: occ pipe  Substance Use Topics  . Alcohol use: Yes    Alcohol/week: 15.0 standard drinks    Types: 3 Glasses of wine, 12 Cans of beer per week    Comment: 12 beers per week & 1 bottle wine per week per pt  . Drug use: Yes    Types: Marijuana    Comment: marijuana daily    Current Outpatient Medications  Medication Sig Dispense Refill  . aspirin 325 MG tablet Take 325 mg by mouth daily.    Marland Kitchen atorvastatin (LIPITOR) 40 MG tablet Take 1 tablet (40 mg total) by mouth daily. 90 tablet 3  . cetirizine (ZYRTEC) 10 MG  tablet Take 10 mg by mouth daily as needed for allergies.    . fish oil-omega-3 fatty acids 1000 MG capsule Take 1 g by mouth daily.     . Glucosamine 500 MG CAPS Take 1 capsule by mouth daily.    Marland Kitchen lisinopril (PRINIVIL,ZESTRIL) 20 MG tablet Take 1 tablet (20 mg total) by mouth daily. 90 tablet 3  . metoprolol tartrate (LOPRESSOR) 50 MG tablet Take one tablet by mouth on 6/25 between 9:30-9:45 AM with a few sips of water 1 tablet 0  . Pseudoephedrine HCl (SUDAFED 12 HOUR PO) Take 1 tablet by mouth daily as needed.     No current facility-administered medications for this visit.    No Known Allergies   Review of Systems: All systems reviewed and negative except where noted in HPI.    Physical Exam:    Wt Readings from Last 3 Encounters:  06/03/19 170 lb 8 oz (77.3 kg)  05/02/19 175 lb 6.4 oz (79.6 kg)  02/17/19 176 lb 6.4 oz (80 kg)    BP 126/90 (BP Location: Left Arm, Patient Position: Sitting, Cuff Size: Normal)   Pulse 84   Temp 98.6 F (37 C)   Ht 5\' 7"  (1.702 m) Comment: height measured without shoes  Wt 170 lb 8 oz (77.3 kg)   BMI 26.70 kg/m  Constitutional:  Pleasant male in no acute distress.  Psychiatric: Normal mood and affect. Behavior is normal. EENT: Pupils normal.  Conjunctivae are normal. No scleral icterus. Neck supple.  Cardiovascular: Normal rate, regular rhythm. Loud murmur. No edema Pulmonary/chest: Effort normal and breath sounds normal. No wheezing, rales or rhonchi. Abdominal: Soft, nondistended, nontender. Bowel sounds active throughout. There are no masses palpable. No hepatomegaly. Neurological: Alert and oriented to person place and time. Skin: Skin is warm and dry. No rashes noted.  Tye Savoy, NP  06/03/2019, 1:50 PM   I spent 25 minutes of face-to-face time with the patient. Greater than 50% of the time was spent counseling and coordinating care. Questions answered

## 2019-06-03 NOTE — Telephone Encounter (Signed)
Patient called back and answered no to all screening questions. 

## 2019-06-03 NOTE — Patient Instructions (Signed)
We will contact you to schedule your colonoscopy after we have spoken with your Cardiologist.   Thank you for choosing me and [facility].  Tye Savoy- NP

## 2019-06-03 NOTE — Telephone Encounter (Signed)
Pt return cll please cll back

## 2019-06-04 ENCOUNTER — Encounter: Payer: Self-pay | Admitting: Nurse Practitioner

## 2019-06-06 NOTE — Progress Notes (Signed)
Agree with assessment as outlined. Severe AS awaiting TAVR, symptomatic. He is quite overdue for his colonoscopy although given his symptoms, may be better to address the AS first and we can do his colonoscopy after he has recovered. I will reach out to Dr. Julianne Handler his cardiologist to discuss plan and get back to the patient.

## 2019-06-07 ENCOUNTER — Telehealth: Payer: Self-pay

## 2019-06-07 NOTE — Telephone Encounter (Signed)
Called patient and relayed Dr. Havery Moros and Dr. Camillia Herter recommendation to have TAVR first and the Colonoscopy in about 6 months. Patient in agreement. Recall in for 6 months-Colonoscopy

## 2019-06-07 NOTE — Telephone Encounter (Signed)
-----   Message from Yetta Flock, MD sent at 06/06/2019  8:53 PM EDT ----- Regarding: FW: mutual patient Charles Robles, Can you let the patient know I spoke with Dr. Angelena Form. I think he should have TAVR first and then we can coordinate his colonoscopy in 6 months or so, if the patient is okay with that. Can you place another recall for him in 6 months.Thanks,  Charles Robles, FYI ----- Message ----- From: Burnell Blanks, MD Sent: 06/06/2019   3:20 PM EDT To: Yetta Flock, MD Subject: RE: mutual patient                             [name]. We usually recommend 6 months of Plavix post valve replacement but we could stop it at 3 months if necessary. Charles Robles ----- Message ----- From: Yetta Flock, MD Sent: 06/06/2019  11:21 AM EDT To: Burnell Blanks, MD Subject: mutual patient                                 Hi, I just wanted to touch base about this mutual patient, looks like he has severe AS and awaiting possible TAVR? He is overdue for his colonoscopy however I think okay to wait until he has recovered from his TAVR if he is having symptoms from the aortic valve. I was curious how long he would need to be on anticoagulation post TAVR, or when he would be able to hold it, if we postponed his colonoscopy for a few months and did the TAVR first. Thanks for your opinion.  [name]

## 2019-06-22 ENCOUNTER — Encounter: Payer: Self-pay | Admitting: Surgery

## 2019-06-22 ENCOUNTER — Ambulatory Visit: Payer: Medicare Other | Attending: Cardiovascular Disease | Admitting: Physical Therapy

## 2019-06-22 ENCOUNTER — Institutional Professional Consult (permissible substitution) (INDEPENDENT_AMBULATORY_CARE_PROVIDER_SITE_OTHER): Payer: Medicare Other | Admitting: Surgery

## 2019-06-22 ENCOUNTER — Other Ambulatory Visit: Payer: Self-pay

## 2019-06-22 ENCOUNTER — Encounter: Payer: Self-pay | Admitting: Physical Therapy

## 2019-06-22 VITALS — BP 145/82 | HR 71 | Temp 96.8°F | Resp 20 | Ht 68.0 in | Wt 170.0 lb

## 2019-06-22 DIAGNOSIS — R262 Difficulty in walking, not elsewhere classified: Secondary | ICD-10-CM

## 2019-06-22 DIAGNOSIS — I350 Nonrheumatic aortic (valve) stenosis: Secondary | ICD-10-CM

## 2019-06-22 NOTE — Therapy (Signed)
[place], [place], [address] Phone: [phone]   Fax:  [phone]  Physical Therapy Evaluation/Pre-TAVR  Patient Details  Name: Charles Robles MRN: [id number] Date of Birth: 04-Feb-1946 Referring Provider (PT): Burnell Blanks, MD   Encounter Date: 06/22/2019  PT End of Session - 06/22/19 1338    Visit Number  1    PT Start Time  0160    PT Stop Time  1093    PT Time Calculation (min)  34 min    Activity Tolerance  Patient tolerated treatment well    Behavior During Therapy  [facility] for tasks assessed/performed       Past Medical History:  Diagnosis Date  . Abnormal large bowel motility   . Allergy   . Aortic stenosis   . Cerumen impaction bilaterally  . Hearing loss   . Hyperlipidemia   . Hypertension   . Melanoma ([place])    melanoma  . Other fatigue   . Routine general medical examination at a health care facility   . Stool incontinence   . Urgency of urination   . Vision loss of right eye     Past Surgical History:  Procedure Laterality Date  . COLONOSCOPY  2017  . EYE SURGERY  1981    Right eye- corneal transplant  . HERNIA REPAIR     x3  . POLYPECTOMY    . RIGHT/LEFT HEART CATH AND CORONARY ANGIOGRAPHY N/A 06/23/2018   Procedure: RIGHT/LEFT HEART CATH AND CORONARY ANGIOGRAPHY;  Surgeon: Burnell Blanks, MD;  Location: [place] [facility];  Service: Cardiovascular;  Laterality: N/A;  . TONSILLECTOMY      There were no vitals filed for this visit.   Subjective Assessment - 06/22/19 1238    Subjective  SOB with moderate physical activity, fatigues quickly and notices a plummet in endurance in last 2-3 mo.    Currently in Pain?  No/denies         [facility] PT Assessment - 06/22/19 0001      Assessment   Medical Diagnosis  severe aortic stenosis    Referring Provider (PT)  Burnell Blanks, MD    Onset Date/Surgical Date  --   3-4 months   Hand  Dominance  Right      Precautions   Precautions  None      Restrictions   Weight Bearing Restrictions  No      Balance Screen   Has the patient fallen in the past 6 months  Yes    How many times?  1    Has the patient had a decrease in activity level because of a fear of falling?   Yes    Is the patient reluctant to leave their home because of a fear of falling?   No      Home Film/video editor residence    Living Arrangements  Alone      Prior Function   Level of [place]  Retired      Associate Professor   Overall Cognitive Status  Within Functional Limits for tasks assessed      Sensation   Additional Comments  WFL      Posture/Postural Control   Posture Comments  rounded shoulders, forward head      ROM / Strength   AROM / PROM / Strength  Strength;AROM      AROM   Overall AROM Comments  WFL      Strength   Overall Strength Comments  WFL    Strength Assessment Site  Hand    Right/Left hand  Right;Left    Right Hand Grip (lbs)  55    Left Hand Grip (lbs)  40       [facility] Pre-Surgical Assessment - 06/22/19 0001    5 Meter Walk Test- trial 1  3 sec    5 Meter Walk Test- trial 2  4 sec.     5 Meter Walk Test- trial 3  4 sec.    5 meter walk test average  3.67 sec    4 Stage Balance Test Position  4    Sit To Stand Test- trial 1  13 sec.    6 Minute Walk- Baseline  yes    BP (mmHg)  149/82    HR (bpm)  64    02 Sat (%RA)  99 %    Modified Borg Scale for Dyspnea  0- Nothing at all    Perceived Rate of Exertion (Borg)  6-    6 Minute Walk Post Test  yes    BP (mmHg)  134/83    HR (bpm)  73    02 Sat (%RA)  97 %    Modified Borg Scale for Dyspnea  2- Mild shortness of breath    Perceived Rate of Exertion (Borg)  13- Somewhat hard    Aerobic Endurance Distance Walked  1175    Endurance additional comments  32% disability compared to age related norm              Objective measurements completed on  examination: See above findings.                           Plan - 06/22/19 1338    PT Frequency  --   one time pre-TAVR evaluation   Consulted and Agree with Plan of Care  Patient      Clinical Impression Statement: Pt is a 73 yo M presenting to OP PT for evaluation prior to possible TAVR surgery due to severe aortic stenosis. Pt reports onset of SOB and fatigue 3-4 months ago. Symptoms are  limiting endurance with ADLs. Pt presents with WFL ROM and strength and denies musculoskeletal pain.Pt ambulated a total of 1175 feet in 6 minute walk and reported 2/10 SOB on modified scale for dyspena and 13/20 RPE on Borg's perceived exertion and pain scale at the end of the walk. During the 6 minute walk test, patient's HR increased to 85 BPM and O2 saturation decreased to 97%. Based on the Short Physical Performance Battery, patient has a frailty rating of 11/12 with </= 5/12 considered frail.   Visit Diagnosis: 1. Difficulty in walking, not elsewhere classified        Problem List Patient Active Problem List   Diagnosis Date Noted  . Low back pain 10/06/2016  . Hyperlipidemia 01/17/2016  . Tarsal tunnel syndrome of right side 07/11/2015  . Rotator cuff tear arthropathy of right shoulder 08/29/2014  . Arthritis of right acromioclavicular joint 08/29/2014  . Severe aortic stenosis 05/23/2014  . Heart murmur 05/23/2014  . 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 Pain in joint, ankle and foot 02/15/2014  . Right hip pain 02/03/2013  . HTN (hypertension) 02/12/2012  . Neck pain on right side 02/11/2012  . Knee pain, left 10/29/2011  . Tear of lateral meniscus  of knee joint 10/29/2011   Horst Ostermiller C. Avalon Coppinger PT, DPT 06/22/19 1:40 PM    [facility] [address] [place], [place], [address] Phone: [phone]   Fax:  [phone]  Name: Charles Robles MRN:  [id number] Date of Birth: 05-16-1946

## 2019-06-22 NOTE — Progress Notes (Signed)
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 Angelena Form in July 2019 for evaluation of his aortic stenosis.  The mean gradient at that time was 38 mmHg and he was asymptomatic.  He subsequently underwent cardiac catheterization in August 2019 which showed mild nonobstructive coronary disease.  The mean gradient across aortic valve was 15.7 mmHg.  He had an exercise tolerance test in August 2019 that showed good exercise tolerance and no EKG changes.  It was decided to continue following his aortic stenosis for a while.  He had a follow-up echo in June 2020 which showed progression of his aortic stenosis with a increase in the mean gradient of 45 mmHg.  Left ventricular systolic function was normal with an ejection fraction of 60 to 65% and severe LVH.  The patient now reports a several month history of progressive exertional dyspnea and fatigue.  He is also had some occasional episodes of dizziness and chest discomfort.  Past Medical History:  Diagnosis Date   Abnormal large bowel motility    Allergy    Aortic stenosis    Cerumen impaction bilaterally   Hearing loss    Hyperlipidemia    Hypertension    Melanoma ([place])    melanoma   Other fatigue    Routine general medical examination at a health care facility    Stool incontinence    Urgency of urination    Vision loss of right eye     Past Surgical History:  Procedure Laterality Date   COLONOSCOPY  2017   EYE SURGERY  1981    Right eye- corneal transplant   HERNIA REPAIR     x3    POLYPECTOMY     RIGHT/LEFT HEART CATH AND CORONARY ANGIOGRAPHY N/A 06/23/2018   Procedure: RIGHT/LEFT HEART CATH AND CORONARY ANGIOGRAPHY;  Surgeon: Burnell Blanks, MD;  Location: [facility];  Service: Cardiovascular;  Laterality: N/A;   TONSILLECTOMY      Family History  Problem Relation Age of Onset   Heart disease Father        MI   Hyperlipidemia Father    Hypertension Mother    Alcohol abuse Mother    Hyperlipidemia Brother    Hypertension Brother    Colon cancer Neg Hx    Colon polyps Neg Hx    Rectal cancer Neg Hx    Stomach cancer Neg Hx    Esophageal cancer Neg Hx     Social History   Socioeconomic History   Marital status: Divorced    Spouse name: Not on file   Number of children: 0   Years of education: Not on file   Highest education level: Not on file  Occupational History   Occupation: Retired-Psychologist  Social Needs   Financial resource strain: Not on file   Food insecurity    Worry: Not on file    Inability: Not on file   Transportation needs    Medical: Not on file    Non-medical: Not on file  Tobacco Use   Smoking status: Current Some Day Smoker  Types: Cigars   Smokeless tobacco: Never Used   Tobacco comment: occ pipe  Substance and Sexual Activity   Alcohol use: Yes    Alcohol/week: 15.0 standard drinks    Types: 3 Glasses of wine, 12 Cans of beer per week    Comment: 12 beers per week & 1 bottle wine per week per pt   Drug use: Yes    Types: Marijuana    Comment: marijuana daily    Sexual activity: Never  Lifestyle   Physical activity    Days per week: Not on file    Minutes per session: Not on file   Stress: Not on file  Relationships   Social connections    Talks on phone: Not on file    Gets together: Not on file    Attends religious service: Not on file    Active member of club or organization: Not on file    Attends meetings of clubs or organizations: Not on file     Relationship status: Not on file   Intimate partner violence    Fear of current or ex partner: Not on file    Emotionally abused: Not on file    Physically abused: Not on file    Forced sexual activity: Not on file  Other Topics Concern   Not on file  Social History Narrative   Not on file    Current Outpatient Medications  Medication Sig Dispense Refill   aspirin 325 MG tablet Take 325 mg by mouth daily.     atorvastatin (LIPITOR) 40 MG tablet Take 1 tablet (40 mg total) by mouth daily. 90 tablet 3   cetirizine (ZYRTEC) 10 MG tablet Take 10 mg by mouth daily as needed for allergies.     fish oil-omega-3 fatty acids 1000 MG capsule Take 1 g by mouth daily.      Glucosamine 500 MG CAPS Take 1 capsule by mouth daily.     lisinopril (PRINIVIL,ZESTRIL) 20 MG tablet Take 1 tablet (20 mg total) by mouth daily. 90 tablet 3   metoprolol tartrate (LOPRESSOR) 50 MG tablet Take one tablet by mouth on 6/25 between 9:30-9:45 AM with a few sips of water 1 tablet 0   Pseudoephedrine HCl (SUDAFED 12 HOUR PO) Take 1 tablet by mouth daily as needed.     No current facility-administered medications for this visit.     No Known Allergies    Review of Systems:   General:  normal appetite, + decreased energy, no weight gain, no weight loss, no fever  Cardiac:  + chest pain with exertion, + occasional chest pain at rest, +SOB with  exertion, no resting SOB, no PND, no orthopnea, no palpitations, no arrhythmia, no atrial fibrillation, no LE edema, + dizzy spells, syncope  Respiratory:  + shortness of breath, no home oxygen, no productive cough, + dry cough, no bronchitis, no wheezing, no hemoptysis, no asthma, no pain with inspiration or cough, no sleep apnea, no CPAP at night  GI:   no difficulty swallowing, no reflux, + frequent heartburn, + hiatal hernia, no abdominal pain, no constipation, + diarrhea, no hematochezia, no hematemesis, no melena  GU:   no dysuria,  no frequency, no urinary  tract infection, no hematuria, no enlarged prostate, no kidney stones, no kidney disease  Vascular:  no pain suggestive of claudication, no pain in feet, no leg cramps, no varicose veins, no DVT, no non-healing foot ulcer  Neuro:   no stroke, ? TIA's, no seizures, no  headaches, no temporary blindness one eye,  no slurred speech, no peripheral neuropathy, no chronic pain, no instability of gait, + some memory/cognitive dysfunction  Musculoskeletal: no arthritis, no joint swelling, no myalgias, no difficulty walking, normal mobility   Skin:   no rash, no itching, no skin infections, no pressure sores or ulcerations  Psych:   no anxiety, no depression, no nervousness, no unusual recent stress  Eyes:   no blurry vision, + floaters, + recent vision changes, + wears glasses or contacts  ENT:   + hearing loss, no loose or painful teeth, no dentures, last saw dentist 2019  Hematologic:  no easy bruising, no abnormal bleeding, no clotting disorder, no frequent epistaxis  Endocrine:  no diabetes, does not check CBG's at home       Physical Exam:   BP (!) 145/82    Pulse 71    Temp (!) 96.8 F (36 C) (Skin)    Resp 20    Ht 5\' 8"  (1.727 m)    Wt 170 lb (77.1 kg)    SpO2 97% Comment: RA   BMI 25.85 kg/m   General:  Elderly, thin, well-appearing  HEENT:  Unremarkable, NCAT, PERLA, EOMI.  Neck:   no JVD, no bruits, no adenopathy or thyromegaly  Chest:   clear to auscultation, symmetrical breath sounds, no wheezes, no rhonchi   CV:   RRR, grade III/VI crescendo/decrescendo murmur heard best at RSB,  no diastolic murmur  Abdomen:  soft, non-tender, no masses   Extremities:  warm, well-perfused, dp and pt pulses palpable, no LE edema  Rectal/GU  Deferred  Neuro:   Grossly non-focal and symmetrical throughout  Skin:   Clean and dry, no rashes, no breakdown   Diagnostic Tests:  Patient Name:   Charles Robles Mally Date of Exam: 04/25/2019 Medical Rec #:  [id number]                Height:       68.0  in Accession #:    [id number]               Weight:       176.4 lb Date of Birth:  Mar 30, 1946                BSA:          1.94 m Patient Age:    29 years                 BP:           128/77 mmHg Patient Gender: M                        HR:           66 bpm. Exam Location:  [address]    Procedure: 2D Echo, Cardiac Doppler and Color Doppler  Indications:    I35.0 Nonrheumatic aortic (valve) stenosis   History:        Patient has prior history of Echocardiogram examinations, most                 recent 10/26/2018. Signs/Symptoms: Murmur Risk Factors:                 Hypertension and Dyslipidemia. Low back pain.   Sonographer:    Diamond Nickel RCS Referring Phys: Mio    1. The left ventricle has normal systolic function with an ejection fraction of 60-65%. The  cavity size was normal. Severe basal septal hypertrophy. Left ventricular diastolic Doppler parameters are consistent with impaired relaxation. No evidence of  left ventricular regional wall motion abnormalities.  2. The right ventricle has normal systolic function. The cavity was normal. There is no increase in right ventricular wall thickness.  3. There is mild mitral annular calcification present.  4. The aortic valve is tricuspid. Severely thickening of the aortic valve. Severe calcifcation of the aortic valve. Aortic valve regurgitation was not assessed by color flow Doppler. Severe stenosis of the aortic valve. LVOT/AV VTI ratio: 0.39. AV Mean  Grad: 45.0 mmHg. AV Vmax: 461.00 cm/s.  5. The interatrial septum appears to be lipomatous.  6. Compared to prior echo of 10/2018, AS appears to have progressed with mean AVG increased from 53mmHg to 15mmHg. Although the dimensionless index is still in the moderate range, the peak velocity and mean gradient are consistent with severe AS.  FINDINGS  Left Ventricle: The left ventricle has normal systolic function, with an ejection fraction  of 60-65%. The cavity size was normal. Severe basal septal hypertrophy. Left ventricular diastolic Doppler parameters are consistent with impaired relaxation.  Normal left ventricular filling pressures No evidence of left ventricular regional wall motion abnormalities..  Right Ventricle: The right ventricle has normal systolic function. The cavity was normal. There is no increase in right ventricular wall thickness.  Left Atrium: Left atrial size was normal in size.  Right Atrium: Right atrial size was normal in size. Right atrial pressure is estimated at 10 mmHg.  Interatrial Septum: No atrial level shunt detected by color flow Doppler. Increased thickness of the atrial septum sparing the fossa ovalis consistent with The interatrial septum appears to be lipomatous.  Pericardium: There is no evidence of pericardial effusion.  Mitral Valve: The mitral valve is normal in structure. There is mild mitral annular calcification present. Mitral valve regurgitation is trivial by color flow Doppler.  Tricuspid Valve: The tricuspid valve is normal in structure. Tricuspid valve regurgitation is trivial by color flow Doppler.  Aortic Valve: The aortic valve is tricuspid Severely thickening of the aortic valve. Severe calcifcation of the aortic valve, with severely decreased cusp excursion. Aortic valve regurgitation was not assessed by color flow Doppler. There is Severe  stenosis of the aortic valve, with a calculated valve area of 1.22 cm.  Pulmonic Valve: The pulmonic valve was normal in structure. Pulmonic valve regurgitation is trivial by color flow Doppler.  Venous: The inferior vena cava is normal in size with greater than 50% respiratory variability.    +--------------+--------++  LEFT VENTRICLE            +----------------+---------++ +--------------+--------++  Diastology                    PLAX 2D                   +----------------+---------++ +--------------+--------++  LV  e' lateral:   8.16 cm/s    LVIDd:         3.10 cm    +----------------+---------++ +--------------+--------++  LV E/e' lateral: 8.2          LVIDs:         1.90 cm    +----------------+---------++ +--------------+--------++  LV e' medial:    7.72 cm/s    LV PW:         1.20 cm    +----------------+---------++ +--------------+--------++  LV E/e' medial:  8.7  LV IVS:        1.80 cm    +----------------+---------++ +--------------+--------++  LVOT diam:     2.00 cm    +--------------+--------++  LV SV:         27 ml      +--------------+--------++  LV SV Index:   13.55      +--------------+--------++  LVOT Area:     3.14 cm   +--------------+--------++                            +--------------+--------++  +---------------+----------++  RIGHT VENTRICLE              +---------------+----------++  RV Basal diam:  1.69 cm      +---------------+----------++  RV S prime:     14.70 cm/s   +---------------+----------++  TAPSE (M-mode): 2.4 cm       +---------------+----------++  RVSP:           31.9 mmHg    +---------------+----------++  +---------------+-------++-----------++  LEFT ATRIUM              Index         +---------------+-------++-----------++  LA diam:        3.30 cm  1.70 cm/m    +---------------+-------++-----------++  LA Vol (A2C):   32.4 ml  16.72 ml/m   +---------------+-------++-----------++  LA Vol (A4C):   41.7 ml  21.52 ml/m   +---------------+-------++-----------++  LA Biplane Vol: 37.9 ml  19.56 ml/m   +---------------+-------++-----------++ +------------+---------++-----------++  RIGHT ATRIUM            Index         +------------+---------++-----------++  RA Pressure: 3.00 mmHg                +------------+---------++-----------++  RA Area:     11.80 cm                +------------+---------++-----------++  RA Volume:   22.80 ml   11.77 ml/m   +------------+---------++-----------++  +------------------+------------++  AORTIC VALVE                       +------------------+------------++  AV Area (Vmax):    1.04 cm       +------------------+------------++  AV Area (Vmean):   1.14 cm       +------------------+------------++  AV Vmax:           461.00 cm/s    +------------------+------------++  AV Vmean:          310.000 cm/s   +------------------+------------++  AV VTI:            0.921 m        +------------------+------------++  AV Peak Grad:      85.0 mmHg      +------------------+------------++  AV Mean Grad:      45.0 mmHg      +------------------+------------++  LVOT Vmax:         153.00 cm/s    +------------------+------------++  LVOT Vmean:        112.000 cm/s   +------------------+------------++  LVOT VTI:          0.358 m        +------------------+------------++  LVOT/AV VTI ratio: 0.39           +------------------+------------++   +-------------+-------++  AORTA                   +-------------+-------++  Ao Root diam: 3.10  cm   +-------------+-------++  +--------------+--------++   +---------------+-----------++  MITRAL VALVE                 TRICUSPID VALVE               +--------------+--------++   +---------------+-----------++  MV Area (PHT): cm           TR Peak grad:   28.9 mmHg     +--------------+--------++   +---------------+-----------++  MV PHT:        msec          TR Vmax:        269.00 cm/s   +--------------+--------++   +---------------+-----------++  MV Decel Time: 393 msec      Estimated RAP:  3.00 mmHg     +--------------+--------++   +---------------+-----------++ +--------------+----------++  RVSP:           31.9 mmHg      MV E velocity: 67.30 cm/s   +---------------+-----------++ +--------------+----------++  MV A velocity: 94.70 cm/s   +--------------+-------+ +--------------+----------++  SHUNTS                   MV E/A ratio:  0.71         +--------------+-------+ +--------------+----------++  Systemic VTI:  0.36 m                                 +--------------+-------+                               Systemic Diam: 2.00 cm                               +--------------+-------+    Fransico Him MD Electronically signed by Fransico Him MD Signature Date/Time: 04/25/2019/3:04:31 PM    Physicians  Panel Physicians Referring Physician Case Authorizing Physician  Burnell Blanks, MD (Primary)    Procedures  RIGHT/LEFT HEART CATH AND CORONARY ANGIOGRAPHY  Conclusion    Prox RCA to Mid RCA lesion is 20% stenosed.  Prox LAD to Mid LAD lesion is 20% stenosed.  Prox Cx lesion is 30% stenosed.  Mid Cx lesion is 30% stenosed.   1. Mild non-obstructive CAD 2. Severe aortic stenosis by echo. By cath, mean gradient 15.7 mmHg, peak to peak gradient 21 mmHg, AVA 1.45 cm2,   Recommendations: Pt with chest pain, dizziness and fatigue felt to be due to his aortic stenosis. Continue workup and planning for TAVR.     Indications  Severe aortic stenosis [I35.0 (ICD-10-CM)]  Procedural Details  Technical Details Indication: 73 yo male with history of hyperlipidemia, HTN and aortic stenosis here today for cardiac cath. I saw him as a new consult in the valve clinic, referred by Dr. Radford Pax, for further evaluation of his aortic stenosis and discussion regarding possible TAVR last week. He has been followed for moderate aortic stenosis for several years. No known CAD. Echo May 2019 with LVEF=60-65%. The aortic valve is thickened and calcified with limited mobility. The mean gradient across the valve is 38 mmHg, peak gradient 63 mmHg. AVA 1.06 cm2. DVI 0.31. He was seen by Dr. Marlou Porch in May 2019 but had no symptoms at that time. He then sought a second opinion regarding his AS from Dr. Radford Pax on 7/123/19. He is a retired Engineer, water and very concerned about  the progression of his aortic valve disease.   Procedure: The risks, benefits, complications, treatment options, and expected outcomes were discussed with the patient. The patient  and/or family concurred with the proposed plan, giving informed consent. The patient was brought to the cath lab after IV hydration was given. The patient was sedated with Versed and Fentanyl. There was an IV catheter present in the right antecubital vein. This area was prepped and draped in a sterile fashion. I then changed out that IV catheter for a 5 French sheath. Right heart cath performed with a balloon tipped catheter. The right wrist was prepped and draped in a sterile fashion. 1% lidocaine was used for local anesthesia. Using the modified Seldinger access technique, a 5 French sheath was placed in the right radial artery. 3 mg Verapamil was given through the sheath. 4000 units IV heparin was given. Standard diagnostic catheters were used to perform selective coronary angiography. I crossed the aortic valve with an AL-1 and a straight wire. LV pressures measured. The sheath was removed from the right radial artery and a Terumo hemostasis band was applied at the arteriotomy site on the right wrist.      Estimated blood loss <50 mL.  During this procedure the patient was administered the following to achieve and maintain moderate conscious sedation: Versed 2 mg, Fentanyl 50 mcg,  while the patient's heart rate, blood pressure, and oxygen saturation were continuously monitored. The period of conscious sedation was 39 minutes, of which I was present face-to-face 100% of this time.  Medications (Filter: Administrations occurring from 06/23/18 1310 to 06/23/18 1423) Medication Rate/Dose/Volume Action  Date Time   midazolam (VERSED) injection (mg) 2 mg Given 06/23/18 1327   Total dose as of 06/23/18 1423        2 mg        fentaNYL (SUBLIMAZE) injection (mcg) 50 mcg Given 06/23/18 1327   Total dose as of 06/23/18 1423        50 mcg        Heparin (Porcine) in NaCl 1000-0.9 UT/500ML-% SOLN (mL) 500 mL Given 06/23/18 1330   Total dose as of 06/23/18 1423 500 mL Given 1330   1,000 mL          lidocaine (PF) (XYLOCAINE) 1 % injection (mL) 2 mL Given 06/23/18 1336   Total dose as of 06/23/18 1423 2 mL Given 1345   4 mL        Radial Cocktail/Verapamil only (mL) 10 mL Given 06/23/18 1345   Total dose as of 06/23/18 1423        10 mL        heparin injection (Units) 4,000 Units Given 06/23/18 1348   Total dose as of 06/23/18 1423        4,000 Units        iohexol (OMNIPAQUE) 350 MG/ML injection (mL) 75 mL Given 06/23/18 1402   Total dose as of 06/23/18 1423        75 mL        Sedation Time  Sedation Time Physician-1: 39 minutes 11 seconds  Complications  Complications documented before study signed (06/23/2018 2:24 PM)   RIGHT/LEFT HEART CATH AND CORONARY ANGIOGRAPHY  None Documented by Burnell Blanks, MD 06/23/2018 2:17 PM  Time Range: Intraprocedure      Coronary Findings  Diagnostic Dominance: Left Left Anterior Descending  Vessel is large.  Prox LAD to Mid LAD lesion 20% stenosed  Prox LAD to Mid LAD lesion is  20% stenosed.  Ramus Intermedius  Vessel is moderate in size.  Left Circumflex  Prox Cx lesion 30% stenosed  Prox Cx lesion is 30% stenosed.  Mid Cx lesion 30% stenosed  Mid Cx lesion is 30% stenosed.  Right Coronary Artery  Vessel is small.  Prox RCA to Mid RCA lesion 20% stenosed  Prox RCA to Mid RCA lesion is 20% stenosed.  Intervention  No interventions have been documented. Coronary Diagrams  Diagnostic Dominance: Left  Intervention  Implants   No implant documentation for this case.  Syngo Images  Show images for CARDIAC CATHETERIZATION  Images on Long Term Storage  Show images for Doil, [facility] "Durene Fruits or Dr. Roselie Awkward"   Link to Procedure Log  Procedure Log    Hemo Data   Most Recent Value  Fick Cardiac Output 5.41 L/min  Fick Cardiac Output Index 2.73 (L/min)/BSA  Aortic Mean Gradient 15.66 mmHg  Aortic Peak Gradient 21 mmHg  Aortic Valve Area 1.45  Aortic Value Area Index 0.73 cm2/BSA  RA A  Wave 8 mmHg  RA V Wave 7 mmHg  RA Mean 6 mmHg  RV Systolic Pressure 22 mmHg  RV Diastolic Pressure 0 mmHg  RV EDP 5 mmHg  PA Systolic Pressure 23 mmHg  PA Diastolic Pressure 6 mmHg  PA Mean 15 mmHg  PW A Wave 10 mmHg  PW V Wave 6 mmHg  PW Mean 6 mmHg  AO Systolic Pressure 725 mmHg  AO Diastolic Pressure 73 mmHg  AO Mean 366 mmHg  LV Systolic Pressure 440 mmHg  LV Diastolic Pressure 5 mmHg  LV EDP 18 mmHg  AOp Systolic Pressure 347 mmHg  AOp Diastolic Pressure 75 mmHg  AOp Mean Pressure 425 mmHg  LVp Systolic Pressure 956 mmHg  LVp Diastolic Pressure 3 mmHg  LVp EDP Pressure 16 mmHg  QP/QS 1  TPVR Index 5.49 HRUI  TSVR Index 37.33 HRUI  PVR SVR Ratio 0.09  TPVR/TSVR Ratio 0.15    ADDENDUM REPORT: 05/13/2019 08:31  CLINICAL DATA:  73 year old male with severe aortic stenosis being evaluated for a TAVR procedure.  EXAM: Cardiac TAVR CT  TECHNIQUE: The patient was scanned on a [employer]. A 120 kV retrospective scan was triggered in the descending thoracic aorta at 111 HU's. Gantry rotation speed was 250 msecs and collimation was .6 mm. No beta blockade or nitro were given. The 3D data set was reconstructed in 5% intervals of the R-R cycle. Systolic and diastolic phases were analyzed on a dedicated work station using MPR, MIP and VRT modes. The patient received 80 cc of contrast.  FINDINGS: Aortic Valve: Trileaflet aortic valve with severely thickened and calcified leaflets and severely restricted leaflet opening, no calcifications are in the LVOT.  Aorta: Normal size wit mild diffuse atherosclerotic plaque and calcifications and no dissection.  Sinotubular Junction: 27 x 26 mm  Ascending Thoracic Aorta: 33 x 33 mm  Aortic Arch: 30 x 29 mm  Descending Thoracic Aorta: 25 x 23 mm  Sinus of Valsalva Measurements:  Non-coronary: 31 mm  Right -coronary: 32 mm  Left -coronary: 34 mm  Coronary Artery Height above Annulus:  Left  Main: 13 mm  Right Coronary: 18 mm  Virtual Basal Annulus Measurements:  Maximum/Minimum Diameter: 28.1 x 21.8 mm  Mean Diameter: 24.7 mm  Perimeter: 79.1 mm  Area: 479 mm2  Optimum Fluoroscopic Angle for Delivery: LAO 4 CAU 6  IMPRESSION: 1. Trileaflet aortic valve with severely thickened and calcified leaflets and severely restricted leaflet  opening, no calcifications are in the LVOT. Annular measurements are suitable for delivery of a 26 mm Edwards-SAPIEN 3 valve. Aortic valve calcium score 2512 consistent with severe aortic stenosis.  2. Sufficient coronary to annulus distance.  3. Optimum Fluoroscopic Angle for Delivery:  LAO 4 CAU 6.  4. No thrombus in the left atrial appendage.   Electronically Signed   By: Ena Dawley   On: 05/13/2019 08:31   CLINICAL DATA:  73 year old male with history of severe aortic valve stenosis. Preprocedural study prior to potential transcatheter aortic valve replacement (TAVR) procedure.  EXAM: CT ANGIOGRAPHY CHEST, ABDOMEN AND PELVIS  TECHNIQUE: Multidetector CT imaging through the chest, abdomen and pelvis was performed using the standard protocol during bolus administration of intravenous contrast. Multiplanar reconstructed images and MIPs were obtained and reviewed to evaluate the vascular anatomy.  CONTRAST:  61mL OMNIPAQUE IOHEXOL 350 MG/ML SOLN  COMPARISON:  None.  FINDINGS: CTA CHEST FINDINGS  Cardiovascular: Heart size is mildly enlarged with right atrial dilatation. Concentric left ventricular hypertrophy. There is no significant pericardial fluid, thickening or pericardial calcification. There is aortic atherosclerosis, as well as atherosclerosis of the great vessels of the mediastinum and the coronary arteries, including calcified atherosclerotic plaque in the left main, left anterior descending, left circumflex and right coronary arteries. Severe thickening calcification of the  aortic valve.  Mediastinum/Lymph Nodes: No pathologically enlarged mediastinal or hilar lymph nodes. Esophagus is unremarkable in appearance. No axillary lymphadenopathy  Lungs/Pleura: 2 mm right lower lobe pulmonary nodule (axial image 60 of series 16). No other larger more suspicious appearing pulmonary nodules or masses are noted. No acute consolidative airspace disease. No pleural effusions.  Musculoskeletal/Soft Tissues: There are no aggressive appearing lytic or blastic lesions noted in the visualized portions of the skeleton.  CTA ABDOMEN AND PELVIS FINDINGS  Hepatobiliary: No suspicious appearing cystic or solid hepatic lesions. No intra or extrahepatic biliary ductal dilatation. Gallbladder is normal in appearance.  Pancreas: No pancreatic mass. No pancreatic ductal dilatation. No pancreatic or peripancreatic fluid or inflammatory changes.  Spleen: Small calcified granuloma in the splenic hilum. Otherwise, unremarkable.  Adrenals/Urinary Tract: 1.5 cm simple cyst in the lower pole the right kidney. Subcentimeter low-attenuation lesions in both kidneys, too small to characterize, but statistically likely to represent tiny cysts. No hydroureteronephrosis. Urinary bladder is normal in appearance. Bilateral adrenal glands are normal in appearance.  Stomach/Bowel: Normal appearance of the stomach. No pathologic dilatation of small bowel or colon. Normal appendix.  Vascular/Lymphatic: Aortic atherosclerosis, without evidence of aneurysm or dissection in the abdominal or pelvic vasculature. Vascular findings and measurements pertinent to potential TAVR procedure, as detailed below. No lymphadenopathy noted in the abdomen or pelvis.  Reproductive: Prostate gland and seminal vesicles are unremarkable in appearance.  Other: No significant volume of ascites.  No pneumoperitoneum.  Musculoskeletal: There are no aggressive appearing lytic or blastic lesions  noted in the visualized portions of the skeleton.  VASCULAR MEASUREMENTS PERTINENT TO TAVR:  AORTA:  Minimal Aortic Diameter-14 x 15 mm  Severity of Aortic Calcification-moderate  RIGHT PELVIS:  Right Common Iliac Artery -  Minimal Diameter-8.5 x 8.7 mm  Tortuosity-moderate  Calcification-mild  Right External Iliac Artery -  Minimal Diameter-7.1 x 7.3 mm  Tortuosity-moderate  Calcification-none  Right Common Femoral Artery -  Minimal Diameter-7.4 x 7.8 mm  Tortuosity-mild  Calcification-none  LEFT PELVIS:  Left Common Iliac Artery -  Minimal Diameter-9.2 x 9.8 mm  Tortuosity-mild  Calcification-mild  Left External Iliac Artery -  Minimal Diameter-6.9 x  7.1 mm  Tortuosity-severe  Calcification-none  Left Common Femoral Artery -  Minimal Diameter-7.7 x 7.4 mm  Tortuosity-mild  Calcification-none  Review of the MIP images confirms the above findings.  IMPRESSION: 1. Vascular findings and measurements pertinent to potential TAVR procedure, as detailed above. 2. Severe thickening calcification of the aortic valve, compatible with the reported clinical history of severe aortic stenosis. 3. Aortic atherosclerosis, in addition to left main and 3 vessel coronary artery disease. Assessment for potential risk factor modification, dietary therapy or pharmacologic therapy may be warranted, if clinically indicated. 4. Concentric left ventricular hypertrophy. 5. 2 mm right lower lobe pulmonary nodule. This is highly nonspecific and statistically likely benign. No follow-up needed if patient is low-risk. Non-contrast chest CT can be considered in 12 months if patient is high-risk. This recommendation follows the consensus statement: Guidelines for Management of Incidental Pulmonary Nodules Detected on CT Images: From the Fleischner Society 2017; Radiology 2017; 284:228-243. 6. Additional incidental findings, as  above.   Electronically Signed   By: Vinnie Langton M.D.   On: 05/12/2019 13:40  STS Risk Score:  Procedure: Isolated AVR  Risk of Mortality: 0.696%  Renal Failure: 0.801%  Permanent Stroke: 0.849%  Prolonged Ventilation: 2.899%  DSW Infection: 0.082%  Reoperation: 3.370%  Morbidity or Mortality:6.339%  Short Length of Stay: 62.829%  Long Length of Stay: 1.550%   Impression:  This 73 year old gentleman has stage D, severe, symptomatic aortic stenosis with New York Heart Association class II symptoms of exertional fatigue and shortness of breath consistent with chronic diastolic congestive heart failure.  He has also been having intermittent episodes of dizziness with change in position and some exertional chest pressure.  I have personally reviewed his 2D echocardiogram, cardiac catheterization, and CTA studies.  His echocardiogram shows a trileaflet aortic valve with severe calcification and restricted mobility.  The mean gradient across aortic valve has increased to 45 mmHg consistent with severe aortic stenosis.  Left ventricular systolic function is normal with severe left ventricular hypertrophy.  Cardiac catheterization in August 2019 showed mild nonobstructive coronary disease.  I agree that aortic valve replacement is indicated in this patient with progressive symptoms of severe aortic stenosis.  He is a low risk surgical patient but given his age I think transcatheter aortic valve replacement is a reasonable alternative for him.  His gated cardiac CTA shows anatomy favorable for transcatheter aortic valve replacement using a Sapien 3 valve.  His abdominal and pelvic CTA shows adequate pelvic vascular anatomy to allow transfemoral insertion.  The patient was counseled at length regarding treatment alternatives for management of severe symptomatic aortic stenosis. The risks and benefits of surgical intervention has been discussed in detail. Long-term prognosis with medical therapy  was discussed. Alternative approaches such as conventional surgical aortic valve replacement, transcatheter aortic valve replacement, and palliative medical therapy were compared and contrasted at length. This discussion was placed in the context of the patient's own specific clinical presentation and past medical history. All of his questions have been addressed.   Following the decision to proceed with transcatheter aortic valve replacement, a discussion was held regarding what types of management strategies would be attempted intraoperatively in the event of life-threatening complications, including whether or not the patient would be considered a candidate for the use of cardiopulmonary bypass and/or conversion to open sternotomy for attempted surgical intervention.  He would certainly be a candidate for emergent sternotomy if needed to manage any intraoperative complications.  The patient has been advised of a variety  of complications that might develop including but not limited to risks of death, stroke, paravalvular leak, aortic dissection or other major vascular complications, aortic annulus rupture, device embolization, cardiac rupture or perforation, mitral regurgitation, acute myocardial infarction, arrhythmia, heart block or bradycardia requiring permanent pacemaker placement, congestive heart failure, respiratory failure, renal failure, pneumonia, infection, other late complications related to structural valve deterioration or migration, or other complications that might ultimately cause a temporary or permanent loss of functional independence or other long term morbidity. The patient provides full informed consent for the procedure as described and all questions were answered.     Plan:  He will be scheduled for transfemoral transcatheter aortic valve replacement on Tuesday, 07/12/2019.   I spent 60 minutes performing this consultation and > 50% of this time was spent face to face  counseling and coordinating the care of this patient's severe symptomatic aortic stenosis.     Gaye Pollack, MD 06/22/2019 3:40 PM

## 2019-06-23 DIAGNOSIS — H6123 Impacted cerumen, bilateral: Secondary | ICD-10-CM | POA: Diagnosis not present

## 2019-06-24 ENCOUNTER — Encounter: Payer: Self-pay | Admitting: Thoracic Surgery (Cardiothoracic Vascular Surgery)

## 2019-06-27 ENCOUNTER — Other Ambulatory Visit: Payer: Self-pay

## 2019-06-27 DIAGNOSIS — I350 Nonrheumatic aortic (valve) stenosis: Secondary | ICD-10-CM

## 2019-07-07 NOTE — Pre-Procedure Instructions (Signed)
Charles Robles  07/07/2019      [facility] [address] [place], [place] [id number] [place] [id number] Phone: [phone] Fax: [phone]    Your procedure is scheduled on Aug. 25.  Report to [facility] Entrance A at 5:30  A.M.  Call this number if you have problems the morning of surgery:  [phone]   Remember:  Do not eat or drink after midnight.      Take these medicines the morning of surgery with A SIP OF WATER : NONE               7 days prior to surgery STOP taking  Aleve, Naproxen, Ibuprofen, Motrin, Advil, Goody's, BC's, all herbal medications, fish oil, and all vitamins.     Do not wear jewelry.  Do not wear lotions, powders, or cologne, or deodorant.  Do not shave 48 hours prior to surgery.  Men may shave face and neck.  Do not bring valuables to the hospital.  [facility] is not responsible for any belongings or valuables.  Contacts, dentures or bridgework may not be worn into surgery.  Leave your suitcase in the car.  After surgery it may be brought to your room.  For patients admitted to the hospital, discharge time will be determined by your treatment team.     Special instructions:  [name]- Preparing For Surgery  Before surgery, you can play an important role. Because skin is not sterile, your skin needs to be as free of germs as possible. You can reduce the number of germs on your skin by washing with CHG (chlorahexidine gluconate) Soap before surgery.  CHG is an antiseptic cleaner which kills germs and bonds with the skin to continue killing germs even after washing.    Oral Hygiene is also important to reduce your risk of infection.  Remember - BRUSH YOUR TEETH THE MORNING OF SURGERY WITH YOUR REGULAR TOOTHPASTE  Please do not use if you have an allergy to CHG or antibacterial soaps. If your skin becomes reddened/irritated stop using the CHG.  Do not shave (including legs and underarms) for  at least 48 hours prior to first CHG shower. It is OK to shave your face.  Please follow these instructions carefully.   1. Shower the NIGHT BEFORE SURGERY and the MORNING OF SURGERY with CHG.   2. If you chose to wash your hair, wash your hair first as usual with your normal shampoo.  3. After you shampoo, rinse your hair and body thoroughly to remove the shampoo.  4. Use CHG as you would any other liquid soap. You can apply CHG directly to the skin and wash gently with a scrungie or a clean washcloth.   5. Apply the CHG Soap to your body ONLY FROM THE NECK DOWN.  Do not use on open wounds or open sores. Avoid contact with your eyes, ears, mouth and genitals (private parts). Wash Face and genitals (private parts)  with your normal soap.  6. Wash thoroughly, paying special attention to the area where your surgery will be performed.  7. Thoroughly rinse your body with warm water from the neck down.  8. DO NOT shower/wash with your normal soap after using and rinsing off the CHG Soap.  9. Pat yourself dry with a CLEAN TOWEL.  10. Wear CLEAN PAJAMAS to bed the night before surgery, wear comfortable clothes the morning of surgery  11. Place CLEAN SHEETS on your  bed the night of your first shower and DO NOT SLEEP WITH PETS.    Day of Surgery:  Do not apply any deodorants/lotions.  Please wear clean clothes to the hospital/surgery center.   Remember to brush your teeth WITH YOUR REGULAR TOOTHPASTE.    Please read over the following fact sheets that you were given. Coughing and Deep Breathing, MRSA Information and Surgical Site Infection Prevention

## 2019-07-08 ENCOUNTER — Other Ambulatory Visit (HOSPITAL_COMMUNITY): Admission: RE | Admit: 2019-07-08 | Payer: Medicare Other | Source: Ambulatory Visit

## 2019-07-08 ENCOUNTER — Ambulatory Visit (HOSPITAL_COMMUNITY)
Admission: RE | Admit: 2019-07-08 | Discharge: 2019-07-08 | Disposition: A | Discharge status: Home / Self Care | Payer: Medicare Other | Source: Ambulatory Visit | Attending: Cardiovascular Disease | Admitting: Cardiovascular Disease

## 2019-07-08 ENCOUNTER — Other Ambulatory Visit: Payer: Self-pay

## 2019-07-08 ENCOUNTER — Other Ambulatory Visit: Payer: Self-pay | Admitting: *Deleted

## 2019-07-08 ENCOUNTER — Encounter (HOSPITAL_COMMUNITY)
Admission: RE | Admit: 2019-07-08 | Discharge: 2019-07-08 | Disposition: A | Discharge status: Home / Self Care | Payer: Medicare Other | Source: Ambulatory Visit | Attending: Cardiovascular Disease | Admitting: Cardiovascular Disease

## 2019-07-08 ENCOUNTER — Encounter (HOSPITAL_COMMUNITY): Payer: Self-pay

## 2019-07-08 DIAGNOSIS — E785 Hyperlipidemia, unspecified: Secondary | ICD-10-CM | POA: Diagnosis not present

## 2019-07-08 DIAGNOSIS — I10 Essential (primary) hypertension: Secondary | ICD-10-CM | POA: Diagnosis not present

## 2019-07-08 DIAGNOSIS — I350 Nonrheumatic aortic (valve) stenosis: Secondary | ICD-10-CM | POA: Insufficient documentation

## 2019-07-08 DIAGNOSIS — Z01818 Encounter for other preprocedural examination: Secondary | ICD-10-CM | POA: Diagnosis not present

## 2019-07-08 DIAGNOSIS — Z79899 Other long term (current) drug therapy: Secondary | ICD-10-CM | POA: Insufficient documentation

## 2019-07-08 DIAGNOSIS — Z7982 Long term (current) use of aspirin: Secondary | ICD-10-CM | POA: Insufficient documentation

## 2019-07-08 DIAGNOSIS — F17290 Nicotine dependence, other tobacco product, uncomplicated: Secondary | ICD-10-CM | POA: Insufficient documentation

## 2019-07-08 DIAGNOSIS — Z20822 Contact with and (suspected) exposure to covid-19: Secondary | ICD-10-CM

## 2019-07-08 HISTORY — DX: Cardiac murmur, unspecified: R01.1

## 2019-07-08 HISTORY — DX: Unspecified osteoarthritis, unspecified site: M19.90

## 2019-07-08 LAB — COMPREHENSIVE METABOLIC PANEL
ALT: 21 U/L (ref 0–44)
AST: 23 U/L (ref 15–41)
Albumin: 3.8 g/dL (ref 3.5–5.0)
Alkaline Phosphatase: 52 U/L (ref 38–126)
Anion gap: 11 (ref 5–15)
BUN: 13 mg/dL (ref 8–23)
CO2: 16 mmol/L — ABNORMAL LOW (ref 22–32)
Calcium: 8.8 mg/dL — ABNORMAL LOW (ref 8.9–10.3)
Chloride: 109 mmol/L (ref 98–111)
Creatinine, Ser: 1 mg/dL (ref 0.61–1.24)
GFR calc Af Amer: >60 mL/min (ref >60)
GFR calc non Af Amer: >60 mL/min (ref >60)
Glucose, Bld: 83 mg/dL (ref 70–99)
Potassium: 4.6 mmol/L (ref 3.5–5.1)
Sodium: 136 mmol/L (ref 135–145)
Total Bilirubin: 0.5 mg/dL (ref 0.3–1.2)
Total Protein: 7 g/dL (ref 6.5–8.1)

## 2019-07-08 LAB — BLOOD GAS, ARTERIAL
Acid-base deficit: 3.5 mmol/L — ABNORMAL HIGH (ref 0.0–2.0)
Bicarbonate: 20.3 mmol/L (ref 20.0–28.0)
Drawn by: 470591
FIO2: 21
O2 Saturation: 98.2 %
Patient temperature: 98.6
pCO2 arterial: 32.4 mmHg (ref 32.0–48.0)
pH, Arterial: 7.414 (ref 7.350–7.450)
pO2, Arterial: 123 mmHg — ABNORMAL HIGH (ref 83.0–108.0)

## 2019-07-08 LAB — URINALYSIS, ROUTINE W REFLEX MICROSCOPIC
Bilirubin Urine: NEGATIVE
Glucose, UA: NEGATIVE mg/dL
Hgb urine dipstick: NEGATIVE
Ketones, ur: NEGATIVE mg/dL
Leukocytes,Ua: NEGATIVE
Nitrite: NEGATIVE
Protein, ur: NEGATIVE mg/dL
Specific Gravity, Urine: 1.011 (ref 1.005–1.030)
pH: 5 (ref 5.0–8.0)

## 2019-07-08 LAB — CBC
HCT: 42.8 % (ref 39.0–52.0)
Hemoglobin: 14.1 g/dL (ref 13.0–17.0)
MCH: 32.2 pg (ref 26.0–34.0)
MCHC: 32.9 g/dL (ref 30.0–36.0)
MCV: 97.7 fL (ref 80.0–100.0)
Platelets: 180 10*3/uL (ref 150–400)
RBC: 4.38 MIL/uL (ref 4.22–5.81)
RDW: 12.9 % (ref 11.5–15.5)
WBC: 7.1 10*3/uL (ref 4.0–10.5)
nRBC: 0 % (ref 0.0–0.2)

## 2019-07-08 LAB — HEMOGLOBIN A1C
Hgb A1c MFr Bld: 5.5 % (ref 4.8–5.6)
Mean Plasma Glucose: 111.15 mg/dL

## 2019-07-08 LAB — PROTIME-INR
INR: 1 (ref 0.8–1.2)
Prothrombin Time: 12.8 seconds (ref 11.4–15.2)

## 2019-07-08 LAB — SURGICAL PCR SCREEN
MRSA, PCR: NEGATIVE
Staphylococcus aureus: NEGATIVE

## 2019-07-08 LAB — TYPE AND SCREEN
ABO/RH(D): A NEG
Antibody Screen: NEGATIVE

## 2019-07-08 LAB — BRAIN NATRIURETIC PEPTIDE: B Natriuretic Peptide: 78.7 pg/mL (ref 0.0–100.0)

## 2019-07-08 LAB — ABO/RH: ABO/RH(D): A NEG

## 2019-07-08 LAB — APTT: aPTT: 26 seconds (ref 24–36)

## 2019-07-08 NOTE — Progress Notes (Signed)
PCP - Dr. Delia Chimes Cardiologist - Dr. Marlou Porch  Chest x-ray - 07/08/2019 EKG - 07/08/2019 Stress Test - 07/01/2018 ECHO - 04/25/2019 Cardiac Cath - 06/23/2018  Sleep Study - n/a CPAP -   Fasting Blood Sugar - n/a Checks Blood Sugar _____ times a day  Blood Thinner Instructions: n/a Aspirin Instructions: continue ASA, hold DOS per order  Anesthesia review: yes, history of cardiac testing  Patient denies shortness of breath, fever, cough and chest pain at PAT appointment   Patient verbalized understanding of instructions that were given to them at the PAT appointment. Patient was also instructed that they will need to review over the PAT instructions again at home before surgery.

## 2019-07-09 LAB — NOVEL CORONAVIRUS, NAA: SARS-CoV-2, NAA: NOT DETECTED

## 2019-07-11 ENCOUNTER — Encounter (HOSPITAL_COMMUNITY): Payer: Self-pay | Admitting: Anesthesiology

## 2019-07-11 MED ORDER — POTASSIUM CHLORIDE 2 MEQ/ML IV SOLN
80.0000 meq | INTRAVENOUS | Status: DC
  Filled 2019-07-11: qty 40

## 2019-07-11 MED ORDER — MAGNESIUM SULFATE 50 % IJ SOLN
40.0000 meq | INTRAMUSCULAR | Status: DC
  Filled 2019-07-11: qty 9.85

## 2019-07-11 MED ORDER — VANCOMYCIN HCL 10 G IV SOLR
1250.0000 mg | INTRAVENOUS | Status: AC
  Administered 2019-07-12: 1250 mg via INTRAVENOUS
  Filled 2019-07-11: qty 1250

## 2019-07-11 MED ORDER — SODIUM CHLORIDE 0.9 % IV SOLN
1.5000 g | INTRAVENOUS | Status: AC
  Administered 2019-07-12: 08:00:00 1.5 g via INTRAVENOUS
  Filled 2019-07-11: qty 1.5

## 2019-07-11 MED ORDER — DEXMEDETOMIDINE HCL IN NACL 400 MCG/100ML IV SOLN
0.1000 ug/kg/h | INTRAVENOUS | Status: AC
  Administered 2019-07-12: 1 ug/kg/h via INTRAVENOUS
  Filled 2019-07-11: qty 100

## 2019-07-11 MED ORDER — NOREPINEPHRINE 4 MG/250ML-% IV SOLN
0.0000 ug/min | INTRAVENOUS | Status: AC
  Administered 2019-07-12: 2 ug/min via INTRAVENOUS
  Filled 2019-07-11: qty 250

## 2019-07-11 MED ORDER — SODIUM CHLORIDE 0.9 % IV SOLN
INTRAVENOUS | Status: DC
  Filled 2019-07-11: qty 30

## 2019-07-11 NOTE — Anesthesia Preprocedure Evaluation (Addendum)
Anesthesia Evaluation  Patient identified by MRN, date of birth, ID band Patient awake    Reviewed: Allergy & Precautions, NPO status , Patient's Chart, lab work & pertinent test results  Airway Mallampati: II  TM Distance: >3 FB Neck ROM: Full    Dental  (+) Dental Advisory Given   Pulmonary Current Smoker and Patient abstained from smoking.,    breath sounds clear to auscultation       Cardiovascular hypertension, Pt. on medications and Pt. on home beta blockers + Valvular Problems/Murmurs AS  Rhythm:Regular Rate:Normal + Systolic murmurs    Neuro/Psych  Neuromuscular disease negative psych ROS   GI/Hepatic negative GI ROS, Neg liver ROS,   Endo/Other  negative endocrine ROS  Renal/GU negative Renal ROS     Musculoskeletal  (+) Arthritis ,   Abdominal Normal abdominal exam  (+)   Peds  Hematology negative hematology ROS (+)   Anesthesia Other Findings   Reproductive/Obstetrics negative OB ROS                           Echo: 1. The left ventricle has normal systolic function with an ejection fraction of 60-65%. The cavity size was normal. Severe basal septal hypertrophy. Left ventricular diastolic Doppler parameters are consistent with impaired relaxation. No evidence of  left ventricular regional wall motion abnormalities.  2. The right ventricle has normal systolic function. The cavity was normal. There is no increase in right ventricular wall thickness.  3. There is mild mitral annular calcification present.  4. The aortic valve is tricuspid. Severely thickening of the aortic valve. Severe calcifcation of the aortic valve. Aortic valve regurgitation was not assessed by color flow Doppler. Severe stenosis of the aortic valve. LVOT/AV VTI ratio: 0.39. AV Mean  Grad: 45.0 mmHg. AV Vmax: 461.00 cm/s.  5. The interatrial septum appears to be lipomatous.  6. Compared to prior echo of 10/2018, AS  appears to have progressed with mean AVG increased from 8mHg to 465mg. Although the dimensionless index is still in the moderate range, the peak velocity and mean gradient are consistent with severe AS.  Anesthesia Physical Anesthesia Plan  ASA: IV  Anesthesia Plan: MAC   Post-op Pain Management:    Induction: Intravenous  PONV Risk Score and Plan: 1 and Ondansetron and Propofol infusion  Airway Management Planned: Natural Airway and Simple Face Mask  Additional Equipment: Arterial line  Intra-op Plan:   Post-operative Plan:   Informed Consent: I have reviewed the patients History and Physical, chart, labs and discussed the procedure including the risks, benefits and alternatives for the proposed anesthesia with the patient or authorized representative who has indicated his/her understanding and acceptance.     Dental advisory given  Plan Discussed with: CRNA  Anesthesia Plan Comments:        Anesthesia Quick Evaluation

## 2019-07-11 NOTE — H&P (Signed)
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 Angelena Form in July 2019 for evaluation of his aortic stenosis. The mean gradient at that time was 38 mmHg and he was asymptomatic. He subsequently underwent cardiac catheterization in August 2019 which showed mild nonobstructive coronary disease. The mean gradient across aortic valve was 15.7 mmHg. He had an exercise tolerance test in August 2019 that showed good exercise tolerance and no EKG changes. It was decided to continue following his aortic stenosis for a while. He had a follow-up echo in June 2020 which showed progression of his aortic stenosis with a increase in the mean gradient of 45 mmHg. Left ventricular systolic function was normal with an ejection fraction of 60 to 65% and severe LVH. The patient now reports a several month history of progressive exertional dyspnea and fatigue. He has also had  occasional episodes of dizziness and chest discomfort.      Past Medical History:  Diagnosis Date   Abnormal large bowel motility    Allergy    Aortic stenosis    Cerumen impaction bilaterally   Hearing loss    Hyperlipidemia    Hypertension    Melanoma ([place])    melanoma   Other fatigue    Routine general medical examination at a health care facility    Stool incontinence    Urgency of urination    Vision loss of right eye         Past Surgical History:  Procedure Laterality Date   COLONOSCOPY  2017   EYE SURGERY  1981   Right eye- corneal transplant   HERNIA REPAIR     x3   POLYPECTOMY       RIGHT/LEFT HEART CATH AND CORONARY ANGIOGRAPHY N/A 06/23/2018   Procedure: RIGHT/LEFT HEART CATH AND CORONARY ANGIOGRAPHY; Surgeon: Burnell Blanks, MD; Location: [facility]; Service: Cardiovascular; Laterality: N/A;   TONSILLECTOMY          Family History  Problem Relation Age of Onset   Heart disease Father    MI   Hyperlipidemia Father    Hypertension Mother    Alcohol abuse Mother    Hyperlipidemia Brother    Hypertension Brother    Colon cancer Neg Hx    Colon polyps Neg Hx    Rectal cancer Neg Hx    Stomach cancer Neg Hx    Esophageal cancer Neg Hx    Social History        Socioeconomic History   Marital status: Divorced    Spouse name: Not on file   Number of children: 0   Years of education: Not on file   Highest education level: Not on file  Occupational History   Occupation: Retired-Psychologist  Social Needs   Financial resource strain: Not on file   Food insecurity    Worry: Not on file    Inability: Not on file   Transportation needs  Medical: Not on file    Non-medical: Not on file  Tobacco Use   Smoking status: Current Some Day Smoker    Types: Cigars   Smokeless tobacco: Never Used   Tobacco comment: occ pipe  Substance and Sexual Activity   Alcohol use: Yes    Alcohol/week: 15.0 standard drinks    Types: 3 Glasses of wine, 12 Cans of beer per week    Comment: 12 beers per week & 1 bottle wine per week per pt   Drug use: Yes    Types: Marijuana    Comment: marijuana daily    Sexual activity: Never  Lifestyle   Physical activity    Days per week: Not on file    Minutes per session: Not on file   Stress: Not on file  Relationships   Social connections    Talks on phone: Not on file    Gets together: Not on file    Attends religious service: Not on file    Active member of club or organization: Not on file    Attends meetings of clubs or organizations: Not on file    Relationship status: Not  on file   Intimate partner violence    Fear of current or ex partner: Not on file    Emotionally abused: Not on file    Physically abused: Not on file    Forced sexual activity: Not on file  Other Topics Concern   Not on file  Social History Narrative   Not on file         Current Outpatient Medications  Medication Sig Dispense Refill   aspirin 325 MG tablet Take 325 mg by mouth daily.     atorvastatin (LIPITOR) 40 MG tablet Take 1 tablet (40 mg total) by mouth daily. 90 tablet 3   cetirizine (ZYRTEC) 10 MG tablet Take 10 mg by mouth daily as needed for allergies.     fish oil-omega-3 fatty acids 1000 MG capsule Take 1 g by mouth daily.      Glucosamine 500 MG CAPS Take 1 capsule by mouth daily.     lisinopril (PRINIVIL,ZESTRIL) 20 MG tablet Take 1 tablet (20 mg total) by mouth daily. 90 tablet 3   metoprolol tartrate (LOPRESSOR) 50 MG tablet Take one tablet by mouth on 6/25 between 9:30-9:45 AM with a few sips of water 1 tablet 0   Pseudoephedrine HCl (SUDAFED 12 HOUR PO) Take 1 tablet by mouth daily as needed.     No current facility-administered medications for this visit.    No Known Allergies   Review of Systems:   General: normal appetite, + decreased energy, no weight gain, no weight loss, no fever  Cardiac: + chest pain with exertion, + occasional chest pain at rest, +SOB with exertion, no resting SOB, no PND, no orthopnea, no palpitations, no arrhythmia, no atrial fibrillation, no LE edema, + dizzy spells, syncope  Respiratory: + shortness of breath, no home oxygen, no productive cough, + dry cough, no bronchitis, no wheezing, no hemoptysis, no asthma, no pain with inspiration or cough, no sleep apnea, no CPAP at night  GI: no difficulty swallowing, no reflux, + frequent heartburn, + hiatal hernia, no abdominal pain, no constipation, + diarrhea, no hematochezia, no hematemesis, no melena  GU: no dysuria, no frequency, no urinary tract infection, no hematuria, no  enlarged prostate, no kidney stones, no kidney disease  Vascular: no pain suggestive of claudication, no pain in feet, no leg cramps, no varicose  veins, no DVT, no non-healing foot ulcer  Neuro: no stroke, ? TIA's, no seizures, no headaches, no temporary blindness one eye, no slurred speech, no peripheral neuropathy, no chronic pain, no instability of gait, + some memory/cognitive dysfunction  Musculoskeletal: no arthritis, no joint swelling, no myalgias, no difficulty walking, normal mobility  Skin: no rash, no itching, no skin infections, no pressure sores or ulcerations  Psych: no anxiety, no depression, no nervousness, no unusual recent stress  Eyes: no blurry vision, + floaters, + recent vision changes, + wears glasses or contacts  ENT: + hearing loss, no loose or painful teeth, no dentures, last saw dentist 2019  Hematologic: no easy bruising, no abnormal bleeding, no clotting disorder, no frequent epistaxis  Endocrine: no diabetes, does not check CBG's at home    Physical Exam:   BP (!) 145/82   Pulse 71   Temp (!) 96.8 F (36 C) (Skin)   Resp 20   Ht 5\' 8"  (1.727 m)   Wt 170 lb (77.1 kg)   SpO2 97% Comment: RA   BMI 25.85 kg/m  General: Elderly, thin, well-appearing  HEENT: Unremarkable, NCAT, PERLA, EOMI.  Neck: no JVD, no bruits, no adenopathy or thyromegaly  Chest: clear to auscultation, symmetrical breath sounds, no wheezes, no rhonchi  CV: RRR, grade III/VI crescendo/decrescendo murmur heard best at RSB, no diastolic murmur  Abdomen: soft, non-tender, no masses  Extremities: warm, well-perfused, dp and pt pulses palpable, no LE edema  Rectal/GU Deferred  Neuro: Grossly non-focal and symmetrical throughout  Skin: Clean and dry, no rashes, no breakdown    Diagnostic Tests:   Patient Name: Charles Robles Date of Exam: 04/25/2019  Medical Rec #: [id number] Height: 68.0 in  Accession #: [id number] Weight: 176.4 lb  Date of Birth: Sep 02, 1946 BSA: 1.94 m  Patient Age:  25 years BP: 128/77 mmHg  Patient Gender: M HR: 66 bpm.  Exam Location: [address]  Procedure: 2D Echo, Cardiac Doppler and Color Doppler  Indications: I35.0 Nonrheumatic aortic (valve) stenosis  History: Patient has prior history of Echocardiogram examinations, most  recent 10/26/2018. Signs/Symptoms: Murmur Risk Factors:  Hypertension and Dyslipidemia. Low back pain.  Sonographer: Diamond Nickel RCS  Referring Phys: Travis  1. The left ventricle has normal systolic function with an ejection fraction of 60-65%. The cavity size was normal. Severe basal septal hypertrophy. Left ventricular diastolic Doppler parameters are consistent with impaired relaxation. No evidence of  left ventricular regional wall motion abnormalities.  2. The right ventricle has normal systolic function. The cavity was normal. There is no increase in right ventricular wall thickness.  3. There is mild mitral annular calcification present.  4. The aortic valve is tricuspid. Severely thickening of the aortic valve. Severe calcifcation of the aortic valve. Aortic valve regurgitation was not assessed by color flow Doppler. Severe stenosis of the aortic valve. LVOT/AV VTI ratio: 0.39. AV Mean  Grad: 45.0 mmHg. AV Vmax: 461.00 cm/s.  5. The interatrial septum appears to be lipomatous.  6. Compared to prior echo of 10/2018, AS appears to have progressed with mean AVG increased from 70mmHg to 47mmHg. Although the dimensionless index is still in the moderate range, the peak velocity and mean gradient are consistent with severe AS.  FINDINGS  Left Ventricle: The left ventricle has normal systolic function, with an ejection fraction of 60-65%. The cavity size was normal. Severe basal septal hypertrophy. Left ventricular diastolic Doppler parameters are consistent with impaired relaxation.  Normal left  ventricular filling pressures No evidence of left ventricular regional wall motion  abnormalities..  Right Ventricle: The right ventricle has normal systolic function. The cavity was normal. There is no increase in right ventricular wall thickness.  Left Atrium: Left atrial size was normal in size.  Right Atrium: Right atrial size was normal in size. Right atrial pressure is estimated at 10 mmHg.  Interatrial Septum: No atrial level shunt detected by color flow Doppler. Increased thickness of the atrial septum sparing the fossa ovalis consistent with The interatrial septum appears to be lipomatous.  Pericardium: There is no evidence of pericardial effusion.  Mitral Valve: The mitral valve is normal in structure. There is mild mitral annular calcification present. Mitral valve regurgitation is trivial by color flow Doppler.  Tricuspid Valve: The tricuspid valve is normal in structure. Tricuspid valve regurgitation is trivial by color flow Doppler.  Aortic Valve: The aortic valve is tricuspid Severely thickening of the aortic valve. Severe calcifcation of the aortic valve, with severely decreased cusp excursion. Aortic valve regurgitation was not assessed by color flow Doppler. There is Severe  stenosis of the aortic valve, with a calculated valve area of 1.22 cm.  Pulmonic Valve: The pulmonic valve was normal in structure. Pulmonic valve regurgitation is trivial by color flow Doppler.  Venous: The inferior vena cava is normal in size with greater than 50% respiratory variability.  +--------------+--------++   LEFT VENTRICLE     +----------------+---------++  +--------------+--------++  Diastology        PLAX 2D      +----------------+---------++  +--------------+--------++  LV e' lateral:  8.16 cm/s     LVIDd:  3.10 cm    +----------------+---------++  +--------------+--------++  LV E/e' lateral: 8.2      LVIDs:  1.90 cm    +----------------+---------++  +--------------+--------++  LV e' medial:  7.72 cm/s     LV PW:  1.20 cm    +----------------+---------++    +--------------+--------++  LV E/e' medial:  8.7      LV IVS:  1.80 cm    +----------------+---------++  +--------------+--------++   LVOT diam:  2.00 cm     +--------------+--------++   LV SV:  27 ml     +--------------+--------++   LV SV Index:  13.55     +--------------+--------++   LVOT Area:  3.14 cm    +--------------+--------++          +--------------+--------++  +---------------+----------++   RIGHT VENTRICLE      +---------------+----------++   RV Basal diam:  1.69 cm     +---------------+----------++   RV S prime:  14.70 cm/s    +---------------+----------++   TAPSE (M-mode): 2.4 cm     +---------------+----------++   RVSP:  31.9 mmHg     +---------------+----------++  +---------------+-------++-----------++   LEFT ATRIUM     Index     +---------------+-------++-----------++   LA diam:  3.30 cm  1.70 cm/m     +---------------+-------++-----------++   LA Vol (A2C):  32.4 ml  16.72 ml/m    +---------------+-------++-----------++   LA Vol (A4C):  41.7 ml  21.52 ml/m    +---------------+-------++-----------++   LA Biplane Vol: 37.9 ml  19.56 ml/m    +---------------+-------++-----------++  +------------+---------++-----------++   RIGHT ATRIUM    Index     +------------+---------++-----------++   RA Pressure: 3.00 mmHg       +------------+---------++-----------++   RA Area:  11.80 cm       +------------+---------++-----------++   RA Volume:  22.80 ml   11.77 ml/m    +------------+---------++-----------++  +------------------+------------++  AORTIC VALVE       +------------------+------------++   AV Area (Vmax):  1.04 cm     +------------------+------------++   AV Area (Vmean):  1.14 cm     +------------------+------------++   AV Vmax:  461.00 cm/s     +------------------+------------++   AV Vmean:  310.000 cm/s    +------------------+------------++   AV VTI:  0.921 m     +------------------+------------++   AV Peak Grad:  85.0 mmHg      +------------------+------------++   AV Mean Grad:  45.0 mmHg     +------------------+------------++   LVOT Vmax:  153.00 cm/s     +------------------+------------++   LVOT Vmean:  112.000 cm/s    +------------------+------------++   LVOT VTI:  0.358 m     +------------------+------------++   LVOT/AV VTI ratio: 0.39     +------------------+------------++  +-------------+-------++   AORTA       +-------------+-------++   Ao Root diam: 3.10 cm    +-------------+-------++  +--------------+--------++ +---------------+-----------++   MITRAL VALVE       TRICUSPID VALVE      +--------------+--------++ +---------------+-----------++   MV Area (PHT): cm     TR Peak grad:  28.9 mmHg     +--------------+--------++ +---------------+-----------++   MV PHT:  msec     TR Vmax:  269.00 cm/s    +--------------+--------++ +---------------+-----------++   MV Decel Time: 393 msec    Estimated RAP:  3.00 mmHg     +--------------+--------++ +---------------+-----------++  +--------------+----------++  RVSP:  31.9 mmHg      MV E velocity: 67.30 cm/s   +---------------+-----------++  +--------------+----------++   MV A velocity: 94.70 cm/s   +--------------+-------+  +--------------+----------++  SHUNTS       MV E/A ratio:  0.71    +--------------+-------+  +--------------+----------++  Systemic VTI:  0.36 m    +--------------+-------+   Systemic Diam: 2.00 cm   +--------------+-------+  Fransico Him MD  Electronically signed by Fransico Him MD  Signature Date/Time: 04/25/2019/3:04:31 PM     Panel Physicians Referring Physician Case Authorizing Physician  Burnell Blanks, MD (Primary)    Procedures  RIGHT/LEFT HEART CATH AND CORONARY ANGIOGRAPHY  Conclusion  Prox RCA to Mid RCA lesion is 20% stenosed.  Prox LAD to Mid LAD lesion is 20% stenosed.  Prox Cx lesion is 30% stenosed.  Mid Cx lesion is 30% stenosed. 1. Mild non-obstructive CAD  2. Severe aortic stenosis by echo. By cath,  mean gradient 15.7 mmHg, peak to peak gradient 21 mmHg, AVA 1.45 cm2,  Recommendations: Pt with chest pain, dizziness and fatigue felt to be due to his aortic stenosis. Continue workup and planning for TAVR.   Indications  Severe aortic stenosis [I35.0 (ICD-10-CM)]  Procedural Details  Technical Details Indication: 73 yo male with history of hyperlipidemia, HTN and aortic stenosis here today for cardiac cath. I saw him as a new consult in the valve clinic, referred by Dr. Radford Pax, for further evaluation of his aortic stenosis and discussion regarding possible TAVR last week. He has been followed for moderate aortic stenosis for several years. No known CAD. Echo May 2019 with LVEF=60-65%. The aortic valve is thickened and calcified with limited mobility. The mean gradient across the valve is 38 mmHg, peak gradient 63 mmHg. AVA 1.06 cm2. DVI 0.31. He was seen by Dr. Marlou Porch in May 2019 but had no symptoms at that time. He then sought a second opinion regarding his AS from Dr. Radford Pax on 7/123/19. He is a retired Engineer, water and very concerned about  the progression of his aortic valve disease.   Procedure: The risks, benefits, complications, treatment options, and expected outcomes were discussed with the patient. The patient and/or family concurred with the proposed plan, giving informed consent. The patient was brought to the cath lab after IV hydration was given. The patient was sedated with Versed and Fentanyl. There was an IV catheter present in the right antecubital vein. This area was prepped and draped in a sterile fashion. I then changed out that IV catheter for a 5 French sheath. Right heart cath performed with a balloon tipped catheter. The right wrist was prepped and draped in a sterile fashion. 1% lidocaine was used for local anesthesia. Using the modified Seldinger access technique, a 5 French sheath was placed in the right radial artery. 3 mg Verapamil was given through the sheath. 4000 units IV  heparin was given. Standard diagnostic catheters were used to perform selective coronary angiography. I crossed the aortic valve with an AL-1 and a straight wire. LV pressures measured. The sheath was removed from the right radial artery and a Terumo hemostasis band was applied at the arteriotomy site on the right wrist.     Estimated blood loss <50 mL.  During this procedure the patient was administered the following to achieve and maintain moderate conscious sedation: Versed 2 mg, Fentanyl 50 mcg, while the patient's heart rate, blood pressure, and oxygen saturation were continuously monitored. The period of conscious sedation was 39 minutes, of which I was present face-to-face 100% of this time.  Medications  (Filter: Administrations occurring from 06/23/18 1310 to 06/23/18 1423)          Medication Rate/Dose/Volume Action  Date Time   midazolam (VERSED) injection (mg) 2 mg Given 06/23/18 1327   Total dose as of 06/23/18 1423        2 mg        fentaNYL (SUBLIMAZE) injection (mcg) 50 mcg Given 06/23/18 1327   Total dose as of 06/23/18 1423        50 mcg        Heparin (Porcine) in NaCl 1000-0.9 UT/500ML-% SOLN (mL) 500 mL Given 06/23/18 1330   Total dose as of 06/23/18 1423 500 mL Given 1330   1,000 mL        lidocaine (PF) (XYLOCAINE) 1 % injection (mL) 2 mL Given 06/23/18 1336   Total dose as of 06/23/18 1423 2 mL Given 1345   4 mL        Radial Cocktail/Verapamil only (mL) 10 mL Given 06/23/18 1345   Total dose as of 06/23/18 1423        10 mL        heparin injection (Units) 4,000 Units Given 06/23/18 1348   Total dose as of 06/23/18 1423        4,000 Units        iohexol (OMNIPAQUE) 350 MG/ML injection (mL) 75 mL Given 06/23/18 1402   Total dose as of 06/23/18 1423        75 mL        Sedation Time  Sedation Time Physician-1: 39 minutes 11 seconds  Complications  Complications documented before study signed (06/23/2018 2:24 PM)   RIGHT/LEFT HEART CATH AND CORONARY  ANGIOGRAPHY   None Documented by Burnell Blanks, MD 06/23/2018 2:17 PM  Time Range: Intraprocedure    Coronary Findings  Diagnostic  Dominance: Left  Left Anterior Descending  Vessel is large.  Prox LAD to Mid LAD lesion 20% stenosed  Prox LAD to Mid LAD lesion is 20% stenosed.  Ramus Intermedius  Vessel is moderate in size.  Left Circumflex  Prox Cx lesion 30% stenosed  Prox Cx lesion is 30% stenosed.  Mid Cx lesion 30% stenosed  Mid Cx lesion is 30% stenosed.  Right Coronary Artery  Vessel is small.  Prox RCA to Mid RCA lesion 20% stenosed  Prox RCA to Mid RCA lesion is 20% stenosed.  Intervention  No interventions have been documented.  Coronary Diagrams  Diagnostic  Dominance: Left   Intervention  Implants     No implant documentation for this case.  Syngo Images  Link to Procedure Log   Show images for CARDIAC CATHETERIZATION Procedure Log  Images on Long [employer] Storage    Show images for [place], [facility] "Durene Fruits or Dr. Roselie Awkward"   Hemo Data   Most Recent Value  Fick Cardiac Output 5.41 L/min  Fick Cardiac Output Index 2.73 (L/min)/BSA  Aortic Mean Gradient 15.66 mmHg  Aortic Peak Gradient 21 mmHg  Aortic Valve Area 1.45  Aortic Value Area Index 0.73 cm2/BSA  RA A Wave 8 mmHg  RA V Wave 7 mmHg  RA Mean 6 mmHg  RV Systolic Pressure 22 mmHg  RV Diastolic Pressure 0 mmHg  RV EDP 5 mmHg  PA Systolic Pressure 23 mmHg  PA Diastolic Pressure 6 mmHg  PA Mean 15 mmHg  PW A Wave 10 mmHg  PW V Wave 6 mmHg  PW Mean 6 mmHg  AO Systolic Pressure 123456 mmHg  AO Diastolic Pressure 73 mmHg  AO Mean [id number] mmHg  LV Systolic Pressure 0000000 mmHg  LV Diastolic Pressure 5 mmHg  LV EDP 18 mmHg  AOp Systolic Pressure Q000111Q mmHg  AOp Diastolic Pressure 75 mmHg  AOp Mean Pressure 0000000 mmHg  LVp Systolic Pressure 99991111 mmHg  LVp Diastolic Pressure 3 mmHg  LVp EDP Pressure 16 mmHg  QP/QS 1  TPVR Index 5.49 HRUI  TSVR Index 37.33 HRUI  PVR SVR Ratio 0.09  TPVR/TSVR  Ratio 0.15    ADDENDUM REPORT: 05/13/2019 08:31  CLINICAL DATA: 73 year old male with severe aortic stenosis being  evaluated for a TAVR procedure.  EXAM:  Cardiac TAVR CT  TECHNIQUE:  The patient was scanned on a [employer]. A 120 kV  retrospective scan was triggered in the descending thoracic aorta at  111 HU's. Gantry rotation speed was 250 msecs and collimation was .6  mm. No beta blockade or nitro were given. The 3D data set was  reconstructed in 5% intervals of the R-R cycle. Systolic and  diastolic phases were analyzed on a dedicated work station using  MPR, MIP and VRT modes. The patient received 80 cc of contrast.  FINDINGS:  Aortic Valve: Trileaflet aortic valve with severely thickened and  calcified leaflets and severely restricted leaflet opening, no  calcifications are in the LVOT.  Aorta: Normal size wit mild diffuse atherosclerotic plaque and  calcifications and no dissection.  Sinotubular Junction: 27 x 26 mm  Ascending Thoracic Aorta: 33 x 33 mm  Aortic Arch: 30 x 29 mm  Descending Thoracic Aorta: 25 x 23 mm  Sinus of Valsalva Measurements:  Non-coronary: 31 mm  Right -coronary: 32 mm  Left -coronary: 34 mm  Coronary Artery Height above Annulus:  Left Main: 13 mm  Right Coronary: 18 mm  Virtual Basal Annulus Measurements:  Maximum/Minimum Diameter: 28.1 x 21.8 mm  Mean Diameter: 24.7 mm  Perimeter: 79.1 mm  Area: 479 mm2  Optimum Fluoroscopic  Angle for Delivery: LAO 4 CAU 6  IMPRESSION:  1. Trileaflet aortic valve with severely thickened and calcified  leaflets and severely restricted leaflet opening, no calcifications  are in the LVOT. Annular measurements are suitable for delivery of a  26 mm Edwards-SAPIEN 3 valve. Aortic valve calcium score 2512  consistent with severe aortic stenosis.  2. Sufficient coronary to annulus distance.  3. Optimum Fluoroscopic Angle for Delivery: LAO 4 CAU 6.  4. No thrombus in the left atrial appendage.    Electronically Signed  By: Ena Dawley  On: 05/13/2019 08:31    CLINICAL DATA: 73 year old male with history of severe aortic valve  stenosis. Preprocedural study prior to potential transcatheter  aortic valve replacement (TAVR) procedure.  EXAM:  CT ANGIOGRAPHY CHEST, ABDOMEN AND PELVIS  TECHNIQUE:  Multidetector CT imaging through the chest, abdomen and pelvis was  performed using the standard protocol during bolus administration of  intravenous contrast. Multiplanar reconstructed images and MIPs were  obtained and reviewed to evaluate the vascular anatomy.  CONTRAST: 78mL OMNIPAQUE IOHEXOL 350 MG/ML SOLN  COMPARISON: None.  FINDINGS:  CTA CHEST FINDINGS  Cardiovascular: Heart size is mildly enlarged with right atrial  dilatation. Concentric left ventricular hypertrophy. There is no  significant pericardial fluid, thickening or pericardial  calcification. There is aortic atherosclerosis, as well as  atherosclerosis of the great vessels of the mediastinum and the  coronary arteries, including calcified atherosclerotic plaque in the  left main, left anterior descending, left circumflex and right  coronary arteries. Severe thickening calcification of the aortic  valve.  Mediastinum/Lymph Nodes: No pathologically enlarged mediastinal or  hilar lymph nodes. Esophagus is unremarkable in appearance. No  axillary lymphadenopathy  Lungs/Pleura: 2 mm right lower lobe pulmonary nodule (axial image 60  of series 16). No other larger more suspicious appearing pulmonary  nodules or masses are noted. No acute consolidative airspace  disease. No pleural effusions.  Musculoskeletal/Soft Tissues: There are no aggressive appearing  lytic or blastic lesions noted in the visualized portions of the  skeleton.  CTA ABDOMEN AND PELVIS FINDINGS  Hepatobiliary: No suspicious appearing cystic or solid hepatic  lesions. No intra or extrahepatic biliary ductal dilatation.  Gallbladder is normal  in appearance.  Pancreas: No pancreatic mass. No pancreatic ductal dilatation. No  pancreatic or peripancreatic fluid or inflammatory changes.  Spleen: Small calcified granuloma in the splenic hilum. Otherwise,  unremarkable.  Adrenals/Urinary Tract: 1.5 cm simple cyst in the lower pole the  right kidney. Subcentimeter low-attenuation lesions in both kidneys,  too small to characterize, but statistically likely to represent  tiny cysts. No hydroureteronephrosis. Urinary bladder is normal in  appearance. Bilateral adrenal glands are normal in appearance.  Stomach/Bowel: Normal appearance of the stomach. No pathologic  dilatation of small bowel or colon. Normal appendix.  Vascular/Lymphatic: Aortic atherosclerosis, without evidence of  aneurysm or dissection in the abdominal or pelvic vasculature.  Vascular findings and measurements pertinent to potential TAVR  procedure, as detailed below. No lymphadenopathy noted in the  abdomen or pelvis.  Reproductive: Prostate gland and seminal vesicles are unremarkable  in appearance.  Other: No significant volume of ascites. No pneumoperitoneum.  Musculoskeletal: There are no aggressive appearing lytic or blastic  lesions noted in the visualized portions of the skeleton.  VASCULAR MEASUREMENTS PERTINENT TO TAVR:  AORTA:  Minimal Aortic Diameter-14 x 15 mm  Severity of Aortic Calcification-moderate  RIGHT PELVIS:  Right Common Iliac Artery -  Minimal Diameter-8.5 x 8.7 mm  Tortuosity-moderate  Calcification-mild  Right  External Iliac Artery -  Minimal Diameter-7.1 x 7.3 mm  Tortuosity-moderate  Calcification-none  Right Common Femoral Artery -  Minimal Diameter-7.4 x 7.8 mm  Tortuosity-mild  Calcification-none  LEFT PELVIS:  Left Common Iliac Artery -  Minimal Diameter-9.2 x 9.8 mm  Tortuosity-mild  Calcification-mild  Left External Iliac Artery -  Minimal Diameter-6.9 x 7.1 mm  Tortuosity-severe  Calcification-none  Left Common  Femoral Artery -  Minimal Diameter-7.7 x 7.4 mm  Tortuosity-mild  Calcification-none  Review of the MIP images confirms the above findings.  IMPRESSION:  1. Vascular findings and measurements pertinent to potential TAVR  procedure, as detailed above.  2. Severe thickening calcification of the aortic valve, compatible  with the reported clinical history of severe aortic stenosis.  3. Aortic atherosclerosis, in addition to left main and 3 vessel  coronary artery disease. Assessment for potential risk factor  modification, dietary therapy or pharmacologic therapy may be  warranted, if clinically indicated.  4. Concentric left ventricular hypertrophy.  5. 2 mm right lower lobe pulmonary nodule. This is highly  nonspecific and statistically likely benign. No follow-up needed if  patient is low-risk. Non-contrast chest CT can be considered in 12  months if patient is high-risk. This recommendation follows the  consensus statement: Guidelines for Management of Incidental  Pulmonary Nodules Detected on CT Images: From the Fleischner Society  2017; Radiology 2017; 284:228-243.  6. Additional incidental findings, as above.  Electronically Signed  By: Vinnie Langton M.D.  On: 05/12/2019 13:40    STS Risk Score:   Procedure: Isolated AVR  Risk of Mortality: 0.696%  Renal Failure: 0.801%  Permanent Stroke: 0.849%  Prolonged Ventilation: 2.899%  DSW Infection: 0.082%  Reoperation: 3.370%  Morbidity or Mortality:6.339%  Short Length of Stay: 62.829%  Long Length of Stay: 1.550%    Impression:   This 73 year old gentleman has stage D, severe, symptomatic aortic stenosis with New York Heart Association class II symptoms of exertional fatigue and shortness of breath consistent with chronic diastolic congestive heart failure. He has also been having intermittent episodes of dizziness with change in position and some exertional chest pressure. I have personally reviewed his 2D  echocardiogram, cardiac catheterization, and CTA studies. His echocardiogram shows a trileaflet aortic valve with severe calcification and restricted mobility. The mean gradient across aortic valve has increased to 45 mmHg consistent with severe aortic stenosis. Left ventricular systolic function is normal with severe left ventricular hypertrophy. Cardiac catheterization in August 2019 showed mild nonobstructive coronary disease. I agree that aortic valve replacement is indicated in this patient with progressive symptoms of severe aortic stenosis. He is a low risk surgical patient but given his age I think transcatheter aortic valve replacement is a reasonable alternative for him. His gated cardiac CTA shows anatomy favorable for transcatheter aortic valve replacement using a Sapien 3 valve. His abdominal and pelvic CTA shows adequate pelvic vascular anatomy to allow transfemoral insertion.   The patient was counseled at length regarding treatment alternatives for management of severe symptomatic aortic stenosis. The risks and benefits of surgical intervention has been discussed in detail. Long-term prognosis with medical therapy was discussed. Alternative approaches such as conventional surgical aortic valve replacement, transcatheter aortic valve replacement, and palliative medical therapy were compared and contrasted at length. This discussion was placed in the context of the patient's own specific clinical presentation and past medical history. All of his questions have been addressed.   Following the decision to proceed with transcatheter aortic valve replacement, a discussion was held  regarding what types of management strategies would be attempted intraoperatively in the event of life-threatening complications, including whether or not the patient would be considered a candidate for the use of cardiopulmonary bypass and/or conversion to open sternotomy for attempted surgical intervention. He would  certainly be a candidate for emergent sternotomy if needed to manage any intraoperative complications.  The patient has been advised of a variety of complications that might develop including but not limited to risks of death, stroke, paravalvular leak, aortic dissection or other major vascular complications, aortic annulus rupture, device embolization, cardiac rupture or perforation, mitral regurgitation, acute myocardial infarction, arrhythmia, heart block or bradycardia requiring permanent pacemaker placement, congestive heart failure, respiratory failure, renal failure, pneumonia, infection, other late complications related to structural valve deterioration or migration, or other complications that might ultimately cause a temporary or permanent loss of functional independence or other long term morbidity. The patient provides full informed consent for the procedure as described and all questions were answered.    Plan:   Transfemoral transcatheter aortic valve replacement on Tuesday, 07/12/2019.    Gaye Pollack, MD

## 2019-07-12 ENCOUNTER — Encounter (HOSPITAL_COMMUNITY)
Admission: RE | Disposition: A | Discharge status: Home / Self Care | Payer: Self-pay | Source: Home / Self Care | Attending: Cardiovascular Disease

## 2019-07-12 ENCOUNTER — Inpatient Hospital Stay (HOSPITAL_COMMUNITY): Payer: Medicare Other | Admitting: Anesthesiology

## 2019-07-12 ENCOUNTER — Inpatient Hospital Stay (HOSPITAL_COMMUNITY): Payer: Medicare Other

## 2019-07-12 ENCOUNTER — Other Ambulatory Visit: Payer: Self-pay | Admitting: Physician Assistant

## 2019-07-12 ENCOUNTER — Inpatient Hospital Stay (HOSPITAL_COMMUNITY)
Admission: RE | Admit: 2019-07-12 | Discharge: 2019-07-13 | DRG: 267 | Disposition: A | Discharge status: Home / Self Care | Payer: Medicare Other | Attending: Cardiovascular Disease | Admitting: Cardiovascular Disease

## 2019-07-12 ENCOUNTER — Inpatient Hospital Stay (HOSPITAL_COMMUNITY): Payer: Medicare Other | Admitting: Physician Assistant

## 2019-07-12 ENCOUNTER — Other Ambulatory Visit: Payer: Self-pay

## 2019-07-12 ENCOUNTER — Encounter (HOSPITAL_COMMUNITY): Payer: Self-pay

## 2019-07-12 DIAGNOSIS — I440 Atrioventricular block, first degree: Secondary | ICD-10-CM | POA: Diagnosis present

## 2019-07-12 DIAGNOSIS — I2510 Atherosclerotic heart disease of native coronary artery without angina pectoris: Secondary | ICD-10-CM | POA: Diagnosis present

## 2019-07-12 DIAGNOSIS — Z8349 Family history of other endocrine, nutritional and metabolic diseases: Secondary | ICD-10-CM

## 2019-07-12 DIAGNOSIS — Z7982 Long term (current) use of aspirin: Secondary | ICD-10-CM

## 2019-07-12 DIAGNOSIS — Z006 Encounter for examination for normal comparison and control in clinical research program: Secondary | ICD-10-CM

## 2019-07-12 DIAGNOSIS — Z947 Corneal transplant status: Secondary | ICD-10-CM | POA: Diagnosis not present

## 2019-07-12 DIAGNOSIS — F17290 Nicotine dependence, other tobacco product, uncomplicated: Secondary | ICD-10-CM | POA: Diagnosis not present

## 2019-07-12 DIAGNOSIS — I10 Essential (primary) hypertension: Secondary | ICD-10-CM | POA: Diagnosis present

## 2019-07-12 DIAGNOSIS — Z952 Presence of prosthetic heart valve: Secondary | ICD-10-CM

## 2019-07-12 DIAGNOSIS — Z8249 Family history of ischemic heart disease and other diseases of the circulatory system: Secondary | ICD-10-CM | POA: Diagnosis not present

## 2019-07-12 DIAGNOSIS — E785 Hyperlipidemia, unspecified: Secondary | ICD-10-CM | POA: Diagnosis not present

## 2019-07-12 DIAGNOSIS — R911 Solitary pulmonary nodule: Secondary | ICD-10-CM | POA: Diagnosis not present

## 2019-07-12 DIAGNOSIS — I350 Nonrheumatic aortic (valve) stenosis: Principal | ICD-10-CM | POA: Diagnosis present

## 2019-07-12 DIAGNOSIS — M19011 Primary osteoarthritis, right shoulder: Secondary | ICD-10-CM | POA: Diagnosis not present

## 2019-07-12 HISTORY — PX: TEE WITHOUT CARDIOVERSION: SHX5443

## 2019-07-12 HISTORY — DX: Solitary pulmonary nodule: R91.1

## 2019-07-12 HISTORY — DX: Presence of prosthetic heart valve: Z95.2

## 2019-07-12 HISTORY — PX: TRANSCATHETER AORTIC VALVE REPLACEMENT, TRANSFEMORAL: SHX6400

## 2019-07-12 HISTORY — DX: Nonrheumatic aortic (valve) stenosis: I35.0

## 2019-07-12 LAB — POCT I-STAT 4, (NA,K, GLUC, HGB,HCT)
Glucose, Bld: 99 mg/dL (ref 70–99)
Glucose, Bld: 108 mg/dL — ABNORMAL HIGH (ref 70–99)
HCT: 37 % — ABNORMAL LOW (ref 39.0–52.0)
HCT: 35 % — ABNORMAL LOW (ref 39.0–52.0)
Hemoglobin: 12.6 g/dL — ABNORMAL LOW (ref 13.0–17.0)
Hemoglobin: 11.9 g/dL — ABNORMAL LOW (ref 13.0–17.0)
Potassium: 4.2 mmol/L (ref 3.5–5.1)
Potassium: 4.3 mmol/L (ref 3.5–5.1)
Sodium: 138 mmol/L (ref 135–145)
Sodium: 138 mmol/L (ref 135–145)

## 2019-07-12 LAB — POCT I-STAT, CHEM 8
BUN: 12 mg/dL (ref 8–23)
Calcium, Ion: 1.26 mmol/L (ref 1.15–1.40)
Chloride: 106 mmol/L (ref 98–111)
Creatinine, Ser: 1 mg/dL (ref 0.61–1.24)
Glucose, Bld: 105 mg/dL — ABNORMAL HIGH (ref 70–99)
HCT: 36 % — ABNORMAL LOW (ref 39.0–52.0)
Hemoglobin: 12.2 g/dL — ABNORMAL LOW (ref 13.0–17.0)
Potassium: 4.4 mmol/L (ref 3.5–5.1)
Sodium: 140 mmol/L (ref 135–145)
TCO2: 23 mmol/L (ref 22–32)

## 2019-07-12 SURGERY — IMPLANTATION, AORTIC VALVE, TRANSCATHETER, FEMORAL APPROACH
Anesthesia: Monitor Anesthesia Care | Site: Chest

## 2019-07-12 MED ORDER — OMEGA-3-ACID ETHYL ESTERS 1 G PO CAPS
1.0000 g | ORAL_CAPSULE | Freq: Every day | ORAL | Status: DC
  Administered 2019-07-13: 10:00:00 1 g via ORAL
  Filled 2019-07-12: qty 1

## 2019-07-12 MED ORDER — MORPHINE SULFATE (PF) 10 MG/ML IV SOLN: 1.0000 mg | INTRAVENOUS | Status: DC | PRN

## 2019-07-12 MED ORDER — OXYCODONE HCL 5 MG PO TABS: 5.0000 mg | ORAL_TABLET | ORAL | Status: DC | PRN

## 2019-07-12 MED ORDER — CHLORHEXIDINE GLUCONATE 0.12 % MT SOLN
15.0000 mL | Freq: Once | OROMUCOSAL | Status: AC
  Administered 2019-07-12: 06:00:00 15 mL via OROMUCOSAL

## 2019-07-12 MED ORDER — VANCOMYCIN HCL IN DEXTROSE 1-5 GM/200ML-% IV SOLN
1000.0000 mg | Freq: Once | INTRAVENOUS | Status: AC
  Administered 2019-07-12: 22:00:00 1000 mg via INTRAVENOUS
  Filled 2019-07-12: qty 200

## 2019-07-12 MED ORDER — SODIUM CHLORIDE 0.9% FLUSH: 3.0000 mL | INTRAVENOUS | Status: DC | PRN

## 2019-07-12 MED ORDER — SODIUM CHLORIDE 0.9 % IV SOLN
INTRAVENOUS | Status: AC
  Filled 2019-07-12 (×3): qty 1.2

## 2019-07-12 MED ORDER — TRAMADOL HCL 50 MG PO TABS: 50.0000 mg | ORAL_TABLET | ORAL | Status: DC | PRN

## 2019-07-12 MED ORDER — LIDOCAINE 2% (20 MG/ML) 5 ML SYRINGE
INTRAMUSCULAR | Status: AC
  Filled 2019-07-12: qty 5

## 2019-07-12 MED ORDER — ATORVASTATIN CALCIUM 40 MG PO TABS
40.0000 mg | ORAL_TABLET | Freq: Every day | ORAL | Status: DC
  Administered 2019-07-12: 17:00:00 40 mg via ORAL
  Filled 2019-07-12: qty 1

## 2019-07-12 MED ORDER — PROTAMINE SULFATE 10 MG/ML IV SOLN
INTRAVENOUS | Status: DC | PRN
  Administered 2019-07-12: 120 mg via INTRAVENOUS

## 2019-07-12 MED ORDER — IODIXANOL 320 MG/ML IV SOLN
INTRAVENOUS | Status: DC | PRN
  Administered 2019-07-12: 08:00:00 40 mL via INTRA_ARTERIAL

## 2019-07-12 MED ORDER — FENTANYL CITRATE (PF) 250 MCG/5ML IJ SOLN
INTRAMUSCULAR | Status: AC
  Filled 2019-07-12: qty 5

## 2019-07-12 MED ORDER — ONDANSETRON HCL 4 MG/2ML IJ SOLN: 4.0000 mg | Freq: Four times a day (QID) | INTRAMUSCULAR | Status: DC | PRN

## 2019-07-12 MED ORDER — SODIUM CHLORIDE 0.9 % IV SOLN: INTRAVENOUS | Status: DC

## 2019-07-12 MED ORDER — MIDAZOLAM HCL 2 MG/2ML IJ SOLN
INTRAMUSCULAR | Status: AC
  Filled 2019-07-12: qty 2

## 2019-07-12 MED ORDER — PROPOFOL 10 MG/ML IV BOLUS
INTRAVENOUS | Status: AC
  Filled 2019-07-12: qty 20

## 2019-07-12 MED ORDER — LIDOCAINE HCL 1 % IJ SOLN
INTRAMUSCULAR | Status: DC | PRN
  Administered 2019-07-12: 10 mL

## 2019-07-12 MED ORDER — EPHEDRINE 5 MG/ML INJ
INTRAVENOUS | Status: AC
  Filled 2019-07-12: qty 10

## 2019-07-12 MED ORDER — PHENYLEPHRINE HCL-NACL 20-0.9 MG/250ML-% IV SOLN
0.0000 ug/min | INTRAVENOUS | Status: DC
  Filled 2019-07-12: qty 250

## 2019-07-12 MED ORDER — CLOPIDOGREL BISULFATE 75 MG PO TABS
75.0000 mg | ORAL_TABLET | Freq: Every day | ORAL | Status: DC
  Administered 2019-07-13: 75 mg via ORAL
  Filled 2019-07-12: qty 1

## 2019-07-12 MED ORDER — ACETAMINOPHEN 325 MG PO TABS: 650.0000 mg | ORAL_TABLET | Freq: Four times a day (QID) | ORAL | Status: DC | PRN

## 2019-07-12 MED ORDER — CHLORHEXIDINE GLUCONATE 0.12 % MT SOLN
OROMUCOSAL | Status: AC
  Administered 2019-07-12: 06:00:00 15 mL via OROMUCOSAL
  Filled 2019-07-12: qty 15

## 2019-07-12 MED ORDER — EPHEDRINE SULFATE-NACL 50-0.9 MG/10ML-% IV SOSY
PREFILLED_SYRINGE | INTRAVENOUS | Status: DC | PRN
  Administered 2019-07-12: 5 mg via INTRAVENOUS

## 2019-07-12 MED ORDER — SUCCINYLCHOLINE CHLORIDE 200 MG/10ML IV SOSY
PREFILLED_SYRINGE | INTRAVENOUS | Status: AC
  Filled 2019-07-12: qty 10

## 2019-07-12 MED ORDER — SODIUM CHLORIDE 0.9 % IV SOLN: 250.0000 mL | INTRAVENOUS | Status: DC | PRN

## 2019-07-12 MED ORDER — DIPHENHYDRAMINE HCL 25 MG PO CAPS: 25.0000 mg | ORAL_CAPSULE | Freq: Every evening | ORAL | Status: DC | PRN

## 2019-07-12 MED ORDER — SODIUM CHLORIDE 0.9 % IV SOLN
INTRAVENOUS | Status: DC | PRN
  Administered 2019-07-12: 08:00:00 1500 mL

## 2019-07-12 MED ORDER — MIDAZOLAM HCL 5 MG/5ML IJ SOLN
INTRAMUSCULAR | Status: DC | PRN
  Administered 2019-07-12: 2 mg via INTRAVENOUS

## 2019-07-12 MED ORDER — DOXYLAMINE SUCCINATE (SLEEP) 25 MG PO TABS: 25.0000 mg | ORAL_TABLET | Freq: Every day | ORAL | Status: DC

## 2019-07-12 MED ORDER — ONDANSETRON HCL 4 MG/2ML IJ SOLN
INTRAMUSCULAR | Status: AC
  Filled 2019-07-12: qty 2

## 2019-07-12 MED ORDER — CHLORHEXIDINE GLUCONATE 4 % EX LIQD: 60.0000 mL | Freq: Once | CUTANEOUS | Status: DC

## 2019-07-12 MED ORDER — HEPARIN SODIUM (PORCINE) 1000 UNIT/ML IJ SOLN
INTRAMUSCULAR | Status: DC | PRN
  Administered 2019-07-12: 12000 [IU] via INTRAVENOUS

## 2019-07-12 MED ORDER — ACETAMINOPHEN 650 MG RE SUPP: 650.0000 mg | Freq: Four times a day (QID) | RECTAL | Status: DC | PRN

## 2019-07-12 MED ORDER — FENTANYL CITRATE (PF) 100 MCG/2ML IJ SOLN
INTRAMUSCULAR | Status: DC | PRN
  Administered 2019-07-12 (×2): 25 ug via INTRAVENOUS

## 2019-07-12 MED ORDER — PHENYLEPHRINE 40 MCG/ML (10ML) SYRINGE FOR IV PUSH (FOR BLOOD PRESSURE SUPPORT)
PREFILLED_SYRINGE | INTRAVENOUS | Status: DC | PRN
  Administered 2019-07-12: 80 ug via INTRAVENOUS

## 2019-07-12 MED ORDER — NITROGLYCERIN IN D5W 200-5 MCG/ML-% IV SOLN: 0.0000 ug/min | INTRAVENOUS | Status: DC

## 2019-07-12 MED ORDER — LIDOCAINE HCL 1 % IJ SOLN
INTRAMUSCULAR | Status: AC
  Filled 2019-07-12: qty 20

## 2019-07-12 MED ORDER — SODIUM CHLORIDE 0.9 % IV SOLN
INTRAVENOUS | Status: AC
  Administered 2019-07-12 (×2): via INTRAVENOUS

## 2019-07-12 MED ORDER — ASPIRIN 81 MG PO CHEW
81.0000 mg | CHEWABLE_TABLET | Freq: Every day | ORAL | Status: DC
  Administered 2019-07-13: 10:00:00 81 mg via ORAL
  Filled 2019-07-12: qty 1

## 2019-07-12 MED ORDER — OMEGA-3 FATTY ACIDS 1000 MG PO CAPS: 1.0000 g | ORAL_CAPSULE | Freq: Every day | ORAL | Status: DC

## 2019-07-12 MED ORDER — MORPHINE SULFATE (PF) 2 MG/ML IV SOLN: 1.0000 mg | INTRAVENOUS | Status: DC | PRN

## 2019-07-12 MED ORDER — CHLORHEXIDINE GLUCONATE 4 % EX LIQD: 30.0000 mL | CUTANEOUS | Status: DC

## 2019-07-12 MED ORDER — ONDANSETRON HCL 4 MG/2ML IJ SOLN
INTRAMUSCULAR | Status: DC | PRN
  Administered 2019-07-12: 4 mg via INTRAVENOUS

## 2019-07-12 MED ORDER — DEXMEDETOMIDINE HCL IN NACL 400 MCG/100ML IV SOLN
INTRAVENOUS | Status: DC | PRN
  Administered 2019-07-12: 38.72 ug via INTRAVENOUS

## 2019-07-12 MED ORDER — LACTATED RINGERS IV SOLN
INTRAVENOUS | Status: DC | PRN
  Administered 2019-07-12 (×2): via INTRAVENOUS

## 2019-07-12 MED ORDER — SODIUM CHLORIDE 0.9 % IV SOLN
1.5000 g | Freq: Two times a day (BID) | INTRAVENOUS | Status: DC
  Administered 2019-07-12 – 2019-07-13 (×2): 1.5 g via INTRAVENOUS
  Filled 2019-07-12 (×3): qty 1.5

## 2019-07-12 MED ORDER — SODIUM CHLORIDE 0.9% FLUSH
3.0000 mL | Freq: Two times a day (BID) | INTRAVENOUS | Status: DC
  Administered 2019-07-13: 3 mL via INTRAVENOUS

## 2019-07-12 MED ORDER — LORATADINE 10 MG PO TABS
10.0000 mg | ORAL_TABLET | Freq: Every day | ORAL | Status: DC
  Administered 2019-07-13: 10 mg via ORAL
  Filled 2019-07-12: qty 1

## 2019-07-12 MED ORDER — GLYCOPYRROLATE 0.2 MG/ML IJ SOLN
INTRAMUSCULAR | Status: DC | PRN
  Administered 2019-07-12: 0.1 mg via INTRAVENOUS

## 2019-07-12 SURGICAL SUPPLY — 83 items
ADH SKN CLS APL DERMABOND .7 (GAUZE/BANDAGES/DRESSINGS) ×2
APL PRP STRL LF DISP 70% ISPRP (MISCELLANEOUS) ×2
BAG DECANTER FOR FLEXI CONT (MISCELLANEOUS) IMPLANT
BAG SNAP BAND KOVER 36X36 (MISCELLANEOUS) ×4 IMPLANT
BLADE CLIPPER SURG (BLADE) IMPLANT
BLADE OSCILLATING /SAGITTAL (BLADE) IMPLANT
BLADE STERNUM SYSTEM 6 (BLADE) IMPLANT
BLADE SURG 10 STRL SS (BLADE) IMPLANT
CABLE ADAPT CONN TEMP 6FT (ADAPTER) ×4 IMPLANT
CATH DIAG EXPO 6F AL1 (CATHETERS) IMPLANT
CATH DIAG EXPO 6F VENT PIG 145 (CATHETERS) ×8 IMPLANT
CATH EXTERNAL FEMALE PUREWICK (CATHETERS) IMPLANT
CATH INFINITI 6F AL2 (CATHETERS) ×2 IMPLANT
CATH S G BIP PACING (CATHETERS) ×4 IMPLANT
CHLORAPREP W/TINT 26 (MISCELLANEOUS) ×4 IMPLANT
CLIP VESOCCLUDE MED 24/CT (CLIP) IMPLANT
CLIP VESOCCLUDE SM WIDE 24/CT (CLIP) IMPLANT
CLOSURE MYNX CONTROL 6F/7F (Vascular Products) ×2 IMPLANT
CONT SPEC 4OZ CLIKSEAL STRL BL (MISCELLANEOUS) ×8 IMPLANT
COVER BACK TABLE 80X110 HD (DRAPES) ×4 IMPLANT
COVER PROBE W GEL 5X96 (DRAPES) ×4 IMPLANT
COVER WAND RF STERILE (DRAPES) ×2 IMPLANT
DECANTER SPIKE VIAL GLASS SM (MISCELLANEOUS) ×4 IMPLANT
DERMABOND ADVANCED (GAUZE/BANDAGES/DRESSINGS) ×2
DERMABOND ADVANCED .7 DNX12 (GAUZE/BANDAGES/DRESSINGS) ×2 IMPLANT
DEVICE CLOSURE PERCLS PRGLD 6F (VASCULAR PRODUCTS) ×4 IMPLANT
DRAPE INCISE IOBAN 66X45 STRL (DRAPES) IMPLANT
DRSG TEGADERM 4X4.75 (GAUZE/BANDAGES/DRESSINGS) ×6 IMPLANT
ELECT CAUTERY BLADE 6.4 (BLADE) IMPLANT
ELECT REM PT RETURN 9FT ADLT (ELECTROSURGICAL) ×4
ELECTRODE REM PT RTRN 9FT ADLT (ELECTROSURGICAL) ×4 IMPLANT
FELT TEFLON 6X6 (MISCELLANEOUS) IMPLANT
GAUZE SPONGE 4X4 12PLY STRL (GAUZE/BANDAGES/DRESSINGS) ×4 IMPLANT
GLOVE BIO SURGEON STRL SZ7.5 (GLOVE) ×4 IMPLANT
GLOVE BIO SURGEON STRL SZ8 (GLOVE) IMPLANT
GLOVE EUDERMIC 7 POWDERFREE (GLOVE) IMPLANT
GLOVE ORTHO TXT STRL SZ7.5 (GLOVE) IMPLANT
GOWN STRL REUS W/ TWL LRG LVL3 (GOWN DISPOSABLE) IMPLANT
GOWN STRL REUS W/ TWL XL LVL3 (GOWN DISPOSABLE) ×2 IMPLANT
GOWN STRL REUS W/TWL LRG LVL3 (GOWN DISPOSABLE)
GOWN STRL REUS W/TWL XL LVL3 (GOWN DISPOSABLE) ×4
GUIDEWIRE SAFE TJ AMPLATZ EXST (WIRE) ×8 IMPLANT
INSERT FOGARTY SM (MISCELLANEOUS) IMPLANT
KIT BASIN OR (CUSTOM PROCEDURE TRAY) ×4 IMPLANT
KIT HEART LEFT (KITS) ×4 IMPLANT
KIT SUCTION CATH 14FR (SUCTIONS) IMPLANT
KIT TURNOVER KIT B (KITS) ×4 IMPLANT
LOOP VESSEL MAXI BLUE (MISCELLANEOUS) IMPLANT
LOOP VESSEL MINI RED (MISCELLANEOUS) IMPLANT
NS IRRIG 1000ML POUR BTL (IV SOLUTION) ×4 IMPLANT
PACK ENDO MINOR (CUSTOM PROCEDURE TRAY) ×4 IMPLANT
PAD ARMBOARD 7.5X6 YLW CONV (MISCELLANEOUS) ×8 IMPLANT
PAD ELECT DEFIB RADIOL ZOLL (MISCELLANEOUS) ×4 IMPLANT
PENCIL BUTTON HOLSTER BLD 10FT (ELECTRODE) IMPLANT
PERCLOSE PROGLIDE 6F (VASCULAR PRODUCTS) ×8
POSITIONER HEAD DONUT 9IN (MISCELLANEOUS) ×4 IMPLANT
SET MICROPUNCTURE 5F STIFF (MISCELLANEOUS) ×4 IMPLANT
SHEATH BRITE TIP 7FR 35CM (SHEATH) ×4 IMPLANT
SHEATH PINNACLE 6F 10CM (SHEATH) ×4 IMPLANT
SHEATH PINNACLE 8F 10CM (SHEATH) ×4 IMPLANT
SLEEVE REPOSITIONING LENGTH 30 (MISCELLANEOUS) ×4 IMPLANT
STOPCOCK MORSE 400PSI 3WAY (MISCELLANEOUS) ×8 IMPLANT
SUT ETHIBOND X763 2 0 SH 1 (SUTURE) IMPLANT
SUT GORETEX CV 4 TH 22 36 (SUTURE) IMPLANT
SUT GORETEX CV4 TH-18 (SUTURE) IMPLANT
SUT MNCRL AB 3-0 PS2 18 (SUTURE) IMPLANT
SUT PROLENE 5 0 C 1 36 (SUTURE) IMPLANT
SUT PROLENE 6 0 C 1 30 (SUTURE) IMPLANT
SUT SILK  1 MH (SUTURE) ×2
SUT SILK 1 MH (SUTURE) ×2 IMPLANT
SUT VIC AB 2-0 CT1 27 (SUTURE)
SUT VIC AB 2-0 CT1 TAPERPNT 27 (SUTURE) IMPLANT
SUT VIC AB 2-0 CTX 36 (SUTURE) IMPLANT
SUT VIC AB 3-0 SH 8-18 (SUTURE) IMPLANT
SYR 50ML LL SCALE MARK (SYRINGE) ×4 IMPLANT
SYR BULB IRRIGATION 50ML (SYRINGE) IMPLANT
SYR MEDRAD MARK V 150ML (SYRINGE) ×4 IMPLANT
TOWEL GREEN STERILE (TOWEL DISPOSABLE) ×8 IMPLANT
TRANSDUCER W/STOPCOCK (MISCELLANEOUS) ×8 IMPLANT
TRAY FOLEY SLVR 16FR TEMP STAT (SET/KITS/TRAYS/PACK) IMPLANT
VALVE 26 ULTRA SAPIEN KIT (Valve) ×2 IMPLANT
WIRE EMERALD 3MM-J .035X150CM (WIRE) ×4 IMPLANT
WIRE EMERALD 3MM-J .035X260CM (WIRE) ×4 IMPLANT

## 2019-07-12 NOTE — Progress Notes (Signed)
Pt ambulated in hallway 150 ft. Tolerated  well. Returned to bed. Bilateral groin level 0. Call light in reach.  Clyde Canterbury, RN

## 2019-07-12 NOTE — Discharge Instructions (Signed)
ACTIVITY AND EXERCISE °• Daily activity and exercise are an important part of your recovery. People recover at different rates depending on their general health and type of valve procedure. °• Most people recovering from TAVR feel better relatively quickly  °• No lifting, pushing, pulling more than 10 pounds (examples to avoid: groceries, vacuuming, gardening, golfing): °            - For one week with a procedure through the groin. °            - For six weeks for procedures through the chest wall or neck. °NOTE: You will typically see one of our providers 7-14 days after your procedure to discuss WHEN TO RESUME the above activities.  °  °  °DRIVING °• Do not drive until you are seen for follow up and cleared by a provider. Generally, we ask patient to not drive for 1 week after their procedure. °• If you have been told by your doctor in the past that you may not drive, you must talk with him/her before you begin driving again. °  °DRESSING °• Groin site: you may leave the clear dressing over the site for up to one week or until it falls off. °  °HYGIENE °• If you had a femoral (leg) procedure, you may take a shower when you return home. After the shower, pat the site dry. Do NOT use powder, oils or lotions in your groin area until the site has completely healed. °• If you had a chest procedure, you may shower when you return home unless specifically instructed not to by your discharging practitioner. °            - DO NOT scrub incision; pat dry with a towel. °            - DO NOT apply any lotions, oils, powders to the incision. °            - No tub baths / swimming for at least 2 weeks. °• If you notice any fevers, chills, increased pain, swelling, bleeding or pus, please contact your doctor. °  °ADDITIONAL INFORMATION °• If you are going to have an upcoming dental procedure, please contact our office as you will require antibiotics ahead of time to prevent infection on your heart valve.  ° ° °If you have any  questions or concerns you can call the structural heart phone during normal business hours 8am-4pm. If you have an urgent need after hours or weekends please call [phone] to talk to the on call provider for general cardiology. If you have an emergency that requires immediate attention, please call 911.  ° ° °After TAVR Checklist ° °Check  Test Description  ° Follow up appointment in 1-2 weeks  You will see our structural heart physician assistant, Charles Robles. Your incision sites will be checked and you will be cleared to drive and resume all normal activities if you are doing well.    ° 1 month echo and follow up  You will have an echo to check on your new heart valve and be seen back in the office by Charles Robles. Many times the echo is not read by your appointment time, but Charles will call you later that day or the following day to report your results.  ° Follow up with your primary cardiologist You will need to be seen by your primary cardiologist in the following 3-6 months after your 1 month appointment in the valve   clinic. Often times your Plavix or Aspirin will be discontinued during this time, but this is decided on a case by case basis.   ° 1 year echo and follow up You will have another echo to check on your heart valve after 1 year and be seen back in the office by Charles Robles. This your last structural heart visit.  ° Bacterial endocarditis prophylaxis  You will have to take antibiotics for the rest of your life before all dental procedures (even teeth cleanings) to protect your heart valve. Antibiotics are also required before some surgeries. Please check with your cardiologist before scheduling any surgeries. Also, please make sure to tell us if you have a penicillin allergy as you will require an alternative antibiotic.   ° ° °

## 2019-07-12 NOTE — Progress Notes (Signed)
Pt received from cath lab. VSS. Bilateral groin level 0. CHG complete. Telemetry applied. Pt oriented to room and unit. Will continue to monitor.  Clyde Canterbury, RN

## 2019-07-12 NOTE — Transfer of Care (Signed)
Immediate Anesthesia Transfer of Care Note  Patient: Charles Robles  Procedure(s) Performed: TRANSCATHETER AORTIC VALVE REPLACEMENT, TRANSFEMORAL (N/A Chest) TRANSESOPHAGEAL ECHOCARDIOGRAM (TEE) (N/A )  Patient Location: Cath Lab  Anesthesia Type:MAC  Level of Consciousness: awake and patient cooperative  Airway & Oxygen Therapy: Patient Spontanous Breathing  Post-op Assessment: Report given to RN and Post -op Vital signs reviewed and stable  Post vital signs: Reviewed and stable  Last Vitals:  Vitals Value Taken Time  BP 133/60 07/12/19 0949  Temp 36.5 C 07/12/19 0947  Pulse 57 07/12/19 0950  Resp 12 07/12/19 0950  SpO2 99 % 07/12/19 0950  Vitals shown include unvalidated device data.  Last Pain:  Vitals:   07/12/19 0947  TempSrc: Temporal  PainSc: 0-No pain         Complications: No apparent anesthesia complications

## 2019-07-12 NOTE — Progress Notes (Signed)
  Echocardiogram 2D Echocardiogram has been performed.  [name] 07/12/2019, 9:53 AM

## 2019-07-12 NOTE — Anesthesia Postprocedure Evaluation (Signed)
Anesthesia Post Note  Patient: Charles Robles  Procedure(s) Performed: TRANSCATHETER AORTIC VALVE REPLACEMENT, TRANSFEMORAL (N/A Chest) TRANSESOPHAGEAL ECHOCARDIOGRAM (TEE) (N/A )     Patient location during evaluation: PACU Anesthesia Type: MAC Level of consciousness: awake and alert Pain management: pain level controlled Vital Signs Assessment: post-procedure vital signs reviewed and stable Respiratory status: spontaneous breathing, nonlabored ventilation, respiratory function stable and patient connected to nasal cannula oxygen Cardiovascular status: stable and blood pressure returned to baseline Postop Assessment: no apparent nausea or vomiting Anesthetic complications: no    Last Vitals:  Vitals:   07/12/19 1400 07/12/19 1417  BP: (!) 110/59   Pulse: (!) 55 (!) 38  Resp: 14 (!) 21  Temp:    SpO2: 98% 100%    Last Pain:  Vitals:   07/12/19 1225  TempSrc:   PainSc: 0-No pain                 Effie Berkshire

## 2019-07-12 NOTE — Interval H&P Note (Signed)
History and Physical Interval Note:  07/12/2019 6:55 AM  Charles Robles  has presented today for surgery, with the diagnosis of Severe Aortic Stenosis.  The various methods of treatment have been discussed with the patient and family. After consideration of risks, benefits and other options for treatment, the patient has consented to  Procedure(s): TRANSCATHETER AORTIC VALVE REPLACEMENT, TRANSFEMORAL (N/A) TRANSESOPHAGEAL ECHOCARDIOGRAM (TEE) (N/A) as a surgical intervention.  The patient's history has been reviewed, patient examined, no change in status, stable for surgery.  I have reviewed the patient's chart and labs.  Questions were answered to the patient's satisfaction.     Fernande Boyden Cody Oliger   Shortness of breath: Yes.   If yes: with what activity?: moderate exertion Worse than previously noted?: No.  New edema, PND, orthopnea: No.  Recent decrease in activity i.e. more difficulty walking to mailbox, climbing stairs, etc: No.  Changes in sleeping i.e. need to utilize to sleep on more pillows, sitting up, etc: No.  Changes since last seen in pre-op visit: No.

## 2019-07-12 NOTE — Op Note (Signed)
[facility]   MULTIDISCIPLINARY HEART VALVE TEAM   TAVR OPERATIVE NOTE   Date of Procedure:  07/12/2019  Preoperative Diagnosis: Severe Aortic Stenosis   Postoperative Diagnosis: Same   Procedure:    Transcatheter Aortic Valve Replacement - Percutaneous Right Transfemoral Approach  Edwards Sapien 3 Ultra THV (size 26 mm, model # 9750TFX, serial # [id number])   Co-Surgeons:  Gaye Pollack, MD and Lauree Chandler, MD   Anesthesiologist:  Suella Broad, MD  Echocardiographer:  Ena Dawley, MD  Pre-operative Echo Findings:  Severe aortic stenosis  Normal left ventricular systolic function  Post-operative Echo Findings:  No paravalvular leak  Normal left ventricular systolic function   BRIEF CLINICAL NOTE AND INDICATIONS FOR SURGERY  This 73 year old gentleman has stage D, severe, symptomatic aortic stenosis with New York Heart Association class II symptoms of exertional fatigue and shortness of breath consistent with chronic diastolic congestive heart failure. He has also been having intermittent episodes of dizziness with change in position and some exertional chest pressure. I have personally reviewed his 2D echocardiogram, cardiac catheterization, and CTA studies. His echocardiogram shows a trileaflet aortic valve with severe calcification and restricted mobility. The mean gradient across aortic valve has increased to 45 mmHg consistent with severe aortic stenosis. Left ventricular systolic function is normal with severe left ventricular hypertrophy. Cardiac catheterization in August 2019 showed mild nonobstructive coronary disease. I agree that aortic valve replacement is indicated in this patient with progressive symptoms of severe aortic stenosis. He is a low risk surgical patient but given his age I think transcatheter aortic valve replacement is a reasonable alternative for him. His gated cardiac CTA shows anatomy favorable for transcatheter aortic  valve replacement using a Sapien 3 valve. His abdominal and pelvic CTA shows adequate pelvic vascular anatomy to allow transfemoral insertion.   The patient was counseled at length regarding treatment alternatives for management of severe symptomatic aortic stenosis. The risks and benefits of surgical intervention has been discussed in detail. Long-term prognosis with medical therapy was discussed. Alternative approaches such as conventional surgical aortic valve replacement, transcatheter aortic valve replacement, and palliative medical therapy were compared and contrasted at length. This discussion was placed in the context of the patient's own specific clinical presentation and past medical history. All of his questions have been addressed.   Following the decision to proceed with transcatheter aortic valve replacement, a discussion was held regarding what types of management strategies would be attempted intraoperatively in the event of life-threatening complications, including whether or not the patient would be considered a candidate for the use of cardiopulmonary bypass and/or conversion to open sternotomy for attempted surgical intervention. He would certainly be a candidate for emergent sternotomy if needed to manage any intraoperative complications.  The patient has been advised of a variety of complications that might develop including but not limited to risks of death, stroke, paravalvular leak, aortic dissection or other major vascular complications, aortic annulus rupture, device embolization, cardiac rupture or perforation, mitral regurgitation, acute myocardial infarction, arrhythmia, heart block or bradycardia requiring permanent pacemaker placement, congestive heart failure, respiratory failure, renal failure, pneumonia, infection, other late complications related to structural valve deterioration or migration, or other complications that might ultimately cause a temporary or permanent loss  of functional independence or other long term morbidity. The patient provides full informed consent for the procedure as described and all questions were answered.    DETAILS OF THE OPERATIVE PROCEDURE  PREPARATION:    The patient is brought to the  operating room on the above mentioned date and appropriate monitoring was established by the anesthesia team. The patient is placed in the supine position on the operating table.  Intravenous antibiotics are administered. The patient is monitored closely throughout the procedure under conscious sedation.  Baseline transthoracic echocardiogram was performed. The patient's chest, abdomen, both groins, and both lower extremities are prepared and draped in a sterile manner. A time out procedure is performed.   PERIPHERAL ACCESS:    Using the modified Seldinger technique, femoral arterial and venous access was obtained with placement of 6 Fr sheaths on the left side.  A pigtail diagnostic catheter was passed through the left arterial sheath under fluoroscopic guidance into the aortic root.  A temporary transvenous pacemaker catheter was passed through the left femoral venous sheath under fluoroscopic guidance into the right ventricle.  The pacemaker was tested to ensure stable lead placement and pacemaker capture. Aortic root angiography was performed in order to determine the optimal angiographic angle for valve deployment.   TRANSFEMORAL ACCESS:   Percutaneous transfemoral access and sheath placement was performed using ultrasound guidance.  The right common femoral artery was cannulated using a micropuncture needle and appropriate location was verified using hand injection angiogram.  A pair of Abbott Perclose percutaneous closure devices were placed and a 6 French sheath replaced into the femoral artery.  The patient was heparinized systemically and ACT verified > 250 seconds.    A 14 Fr transfemoral E-sheath was introduced into the right common  femoral artery after progressively dilating over an Amplatz superstiff wire. An AL-2 catheter was used to direct a straight-tip exchange length wire across the native aortic valve into the left ventricle. This was exchanged out for a pigtail catheter and position was confirmed in the LV apex. Simultaneous LV and Ao pressures were recorded.  The pigtail catheter was exchanged for an Amplatz Extra-stiff wire in the LV apex.     BALLOON AORTIC VALVULOPLASTY:   Not performed   TRANSCATHETER HEART VALVE DEPLOYMENT:   An Edwards Sapien 3 Ultra transcatheter heart valve (size 26 mm, model #[id number], serial [id number]) was prepared and crimped per manufacturer's guidelines, and the proper orientation of the valve is confirmed on the [employer] delivery system. The valve was advanced through the introducer sheath using normal technique until in an appropriate position in the abdominal aorta beyond the sheath tip. The balloon was then retracted and using the fine-tuning wheel was centered on the valve. The valve was then advanced across the aortic arch using appropriate flexion of the catheter. The valve was carefully positioned across the aortic valve annulus. The Commander catheter was retracted using normal technique. Once final position of the valve has been confirmed by angiographic assessment, the valve is deployed while temporarily holding ventilation and during rapid ventricular pacing to maintain systolic blood pressure < 50 mmHg and pulse pressure < 10 mmHg. The balloon inflation is held for >3 seconds after reaching full deployment volume. Once the balloon has fully deflated the balloon is retracted into the ascending aorta and valve function is assessed using echocardiography. There is felt to be no paravalvular leak and no central aortic insufficiency.  The patient's hemodynamic recovery following valve deployment is good.  The deployment balloon and guidewire are both removed.    PROCEDURE  COMPLETION:   The sheath was removed and femoral artery closure performed.  Protamine was administered once femoral arterial repair was complete. The temporary pacemaker, pigtail catheters and femoral sheaths were removed with manual pressure  used for hemostasis.  A Mynx femoral closure device was utilized following removal of the diagnostic sheath in the left femoral artery.  The patient tolerated the procedure well and is transported to the surgical intensive care in stable condition. There were no immediate intraoperative complications. All sponge instrument and needle counts are verified correct at completion of the operation.   No blood products were administered during the operation.  The patient received a total of 40 mL of intravenous contrast during the procedure.   Gaye Pollack, MD 07/12/2019 3:44 PM

## 2019-07-12 NOTE — CV Procedure (Signed)
[facility]  TAVR OPERATIVE NOTE   Date of Procedure:  07/12/2019  Preoperative Diagnosis: Severe Aortic Stenosis   Postoperative Diagnosis: Same   Procedure:    Transcatheter Aortic Valve Replacement - Transfemoral Approach  Edwards Sapien 3 THV (size 26 mm, model # [id number], serial # [id number])   Co-Surgeons:  Lauree Chandler, MD and Gaye Pollack, MD   Anesthesiologist:  Smith Robert  Echocardiographer:  Meda Coffee  Pre-operative Echo Findings:  Severe aortic stenosis  Normal left ventricular systolic function  Post-operative Echo Findings:  No paravalvular leak  Normal left ventricular systolic function  BRIEF CLINICAL NOTE AND INDICATIONS FOR SURGERY  73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 He is followed in our office by Dr. Radford Pax. I saw him in the valve clinic as a new consult July 2019 for further evaluation of his aortic stenosis and discussion regarding possible TAVR. He has been followed for moderate aortic stenosis for several years. No known CAD. Echo May 2019 with LVEF=60-65%. The aortic valve is thickened and calcified with limited mobility. The mean gradient across the valve is 38 mmHg, peak gradient 63 mmHg. AVA 1.06 cm2. DVI 0.31. His aortic stenosis was felt to be moderate at that time. He was seen by Dr. Marlou Porch in May 2019 but had no symptoms at that time. He then sought a second opinion regarding his AS from Dr. Radford Pax on 7/123/19. Cardiac cath August 2019 with mild CAD. Mean gradient across the aortic valve by cath was 15.7 mmHg. Exercise tolerance test August 2019 with good exercise tolerance and no EKG changes. We elected to follow his aortic stenosis at that time. Most recent echo  June 2020 with worsening of his aortic valve disease with mean gradient of 45 mmHg. Normal LV systolic function with TKWI=09-73%, severe LVH. He has had progressive dyspnea.   During the course of the patient's  preoperative work up they have been evaluated comprehensively by a multidisciplinary team of specialists coordinated through the [facility] in the [facility].  They have been demonstrated to suffer from symptomatic severe aortic stenosis as noted above. The patient has been counseled extensively as to the relative risks and benefits of all options for the treatment of severe aortic stenosis including long term medical therapy, conventional surgery for aortic valve replacement, and transcatheter aortic valve replacement.  The patient has been independently evaluated by Dr. Cyndia Bent with CT surgery and they are felt to be at high risk for conventional surgical aortic valve replacement. The surgeon indicated the patient would be a poor candidate for conventional surgery. Based upon review of all of the patient's preoperative diagnostic tests they are felt to be candidate for transcatheter aortic valve replacement using the transfemoral approach as an alternative to high risk conventional surgery.    Following the decision to proceed with transcatheter aortic valve replacement, a discussion has been held regarding what types of management strategies would be attempted intraoperatively in the event of life-threatening complications, including whether or not the patient would be considered a candidate for the use of cardiopulmonary bypass and/or conversion to open sternotomy for attempted surgical intervention.  The patient has been advised of a variety of complications that might develop peculiar to this approach including but not limited to risks of death, stroke, paravalvular leak, aortic dissection or other major vascular complications, aortic annulus rupture, device embolization, cardiac rupture or perforation, acute myocardial infarction, arrhythmia, heart block or bradycardia requiring  permanent pacemaker placement, congestive heart failure, respiratory failure,  renal failure, pneumonia, infection, other late complications related to structural valve deterioration or migration, or other complications that might ultimately cause a temporary or permanent loss of functional independence or other long term morbidity.  The patient provides full informed consent for the procedure as described and all questions were answered preoperatively.    DETAILS OF THE OPERATIVE PROCEDURE  PREPARATION:   The patient is brought to the operating room on the above mentioned date and central monitoring was established by the anesthesia team including placement of a radial arterial line. The patient is placed in the supine position on the operating table.  Intravenous antibiotics are administered. Conscious sedation is used.   Baseline transthoracic echocardiogram was performed. The patient's chest, abdomen, both groins, and both lower extremities are prepared and draped in a sterile manner. A time out procedure is performed.   PERIPHERAL ACCESS:   Using the modified Seldinger technique, femoral arterial and venous access were obtained with placement of 6 Fr sheaths on the left side using u/s guidance.  A pigtail diagnostic catheter was passed through the femoral arterial sheath under fluoroscopic guidance into the aortic root.  A temporary transvenous pacemaker catheter was passed through the femoral venous sheath under fluoroscopic guidance into the right ventricle.  The pacemaker was tested to ensure stable lead placement and pacemaker capture. Aortic root angiography was performed in order to determine the optimal angiographic angle for valve deployment.  TRANSFEMORAL ACCESS:  A micropuncture kit was used to gain access to the right femoral artery using u/s guidance. Position confirmed with angiography. Pre-closure with double ProGlide closure devices. The patient was heparinized systemically and ACT verified > 250 seconds.    A 14 Fr transfemoral E-sheath was introduced  into the right femoral artery after progressively dilating over an Amplatz superstiff wire. An AL-2 catheter was used to direct a straight-tip exchange length wire across the native aortic valve into the left ventricle. This was exchanged out for a pigtail catheter and position was confirmed in the LV apex. Simultaneous LV and Ao pressures were recorded.  The pigtail catheter was then exchanged for an Amplatz Extra-stiff wire in the LV apex.   TRANSCATHETER HEART VALVE DEPLOYMENT:  An Edwards Sapien 3 THV (size 26 mm) was prepared and crimped per manufacturer's guidelines, and the proper orientation of the valve is confirmed on the [employer] delivery system. The valve was advanced through the introducer sheath using normal technique until in an appropriate position in the abdominal aorta beyond the sheath tip. The balloon was then retracted and using the fine-tuning wheel was centered on the valve. The valve was then advanced across the aortic arch using appropriate flexion of the catheter. The valve was carefully positioned across the aortic valve annulus. The Commander catheter was retracted using normal technique. Once final position of the valve has been confirmed by angiographic assessment, the valve is deployed while temporarily holding ventilation and during rapid ventricular pacing to maintain systolic blood pressure < 50 mmHg and pulse pressure < 10 mmHg. The balloon inflation is held for >3 seconds after reaching full deployment volume. Once the balloon has fully deflated the balloon is retracted into the ascending aorta and valve function is assessed using TTE. There is felt to be no paravalvular leak and no central aortic insufficiency.  The patient's hemodynamic recovery following valve deployment is good.  The deployment balloon and guidewire are both removed. Echo demostrated acceptable post-procedural gradients, stable mitral valve  function, and no AI.   PROCEDURE COMPLETION:  The  sheath was then removed and closure devices were completed. Protamine was administered once femoral arterial repair was complete. The temporary pacemaker, pigtail catheters and femoral sheaths were removed with Mynx closure device placed in the artery and manual pressure used for venous hemostasis.   The patient tolerated the procedure well and is transported to the surgical intensive care in stable condition. There were no immediate intraoperative complications. All sponge instrument and needle counts are verified correct at completion of the operation.   No blood products were administered during the operation.  The patient received a total of 40 mL of intravenous contrast during the procedure.  Lauree Chandler MD 07/12/2019 9:26 AM

## 2019-07-12 NOTE — Anesthesia Procedure Notes (Signed)
Arterial Line Insertion Start/End8/25/2020 6:50 AM, 07/12/2019 7:10 AM Performed by: Lance Coon, CRNA, CRNA  Patient location: Pre-op. Preanesthetic checklist: patient identified, IV checked, site marked, risks and benefits discussed, surgical consent, monitors and equipment checked, pre-op evaluation, timeout performed and anesthesia consent Lidocaine 1% used for infiltration Right, radial was placed Catheter size: 20 G Hand hygiene performed , maximum sterile barriers used  and Seldinger technique used  Attempts: 3 Procedure performed without using ultrasound guided technique. Following insertion, dressing applied and Biopatch. Post procedure assessment: normal  Patient tolerated the procedure well with no immediate complications.

## 2019-07-12 NOTE — Progress Notes (Signed)
  [facility]   MULTIDISCIPLINARY HEART VALVE TEAM  Patient doing well s/p TAVR. He is hemodynamically stable. Groin sites stable. ECG with sinus and 1st deg AV block but no high grade block. Arterial line discontinued and transferred to 4E. Plan for early ambulation after bedrest completed and hopeful discharge over the next 24-48 hours.   Tinaya Ceballos PA-C  [facility]  Pager [phone]  

## 2019-07-13 ENCOUNTER — Inpatient Hospital Stay (HOSPITAL_COMMUNITY): Payer: Medicare Other

## 2019-07-13 ENCOUNTER — Encounter: Payer: Self-pay | Admitting: Thoracic Surgery (Cardiothoracic Vascular Surgery)

## 2019-07-13 ENCOUNTER — Encounter (HOSPITAL_COMMUNITY): Payer: Self-pay | Admitting: Cardiovascular Disease

## 2019-07-13 DIAGNOSIS — Z952 Presence of prosthetic heart valve: Secondary | ICD-10-CM

## 2019-07-13 DIAGNOSIS — I350 Nonrheumatic aortic (valve) stenosis: Principal | ICD-10-CM

## 2019-07-13 LAB — MAGNESIUM: Magnesium: 2.1 mg/dL (ref 1.7–2.4)

## 2019-07-13 LAB — BASIC METABOLIC PANEL
Anion gap: 8 (ref 5–15)
BUN: 13 mg/dL (ref 8–23)
CO2: 21 mmol/L — ABNORMAL LOW (ref 22–32)
Calcium: 8.3 mg/dL — ABNORMAL LOW (ref 8.9–10.3)
Chloride: 104 mmol/L (ref 98–111)
Creatinine, Ser: 1.08 mg/dL (ref 0.61–1.24)
GFR calc Af Amer: >60 mL/min (ref >60)
GFR calc non Af Amer: >60 mL/min (ref >60)
Glucose, Bld: 109 mg/dL — ABNORMAL HIGH (ref 70–99)
Potassium: 4 mmol/L (ref 3.5–5.1)
Sodium: 133 mmol/L — ABNORMAL LOW (ref 135–145)

## 2019-07-13 LAB — CBC
HCT: 35.6 % — ABNORMAL LOW (ref 39.0–52.0)
Hemoglobin: 11.9 g/dL — ABNORMAL LOW (ref 13.0–17.0)
MCH: 32.3 pg (ref 26.0–34.0)
MCHC: 33.4 g/dL (ref 30.0–36.0)
MCV: 96.7 fL (ref 80.0–100.0)
Platelets: 130 10*3/uL — ABNORMAL LOW (ref 150–400)
RBC: 3.68 MIL/uL — ABNORMAL LOW (ref 4.22–5.81)
RDW: 12.8 % (ref 11.5–15.5)
WBC: 7.8 10*3/uL (ref 4.0–10.5)
nRBC: 0 % (ref 0.0–0.2)

## 2019-07-13 LAB — ECHOCARDIOGRAM COMPLETE
Height: 68 in
Weight: 2719.59 oz

## 2019-07-13 MED ORDER — ASPIRIN 81 MG PO CHEW: 81.0000 mg | CHEWABLE_TABLET | Freq: Every day | ORAL | 1 refills | Status: DC

## 2019-07-13 MED ORDER — CLOPIDOGREL BISULFATE 75 MG PO TABS: 75.0000 mg | ORAL_TABLET | Freq: Every day | ORAL | 1 refills | Status: DC

## 2019-07-13 MED ORDER — LISINOPRIL 10 MG PO TABS: 20.0000 mg | ORAL_TABLET | Freq: Every day | ORAL | Status: DC

## 2019-07-13 MED ORDER — AMOXICILLIN 500 MG PO TABS: 2000.0000 mg | ORAL_TABLET | ORAL | 12 refills | Status: DC

## 2019-07-13 MED FILL — AMOXICILLIN 500 MG CAPS: 500 | 3 days supply | Qty: 12 | Fill #0

## 2019-07-13 MED FILL — ASPIRIN LOW DOSE 81 MG CHEW: 81 | 90 days supply | Qty: 90 | Fill #0

## 2019-07-13 MED FILL — CLOPIDOGREL 75 MG TABLET: 75 | 90 days supply | Qty: 90 | Fill #0

## 2019-07-13 NOTE — Plan of Care (Signed)
Poc progressing.  

## 2019-07-13 NOTE — Progress Notes (Signed)
CARDIAC REHAB PHASE I   Offered to walk with pt, pt states he has been walking independently multiple times this morning. Pt denies pain or SOB. Reviewed restrictions, site care, and exercise guidelines with pt. Pt declining CRP II at this time.   [place]:[id number] Rufina Falco, RN BSN 07/13/2019 10:30 AM

## 2019-07-13 NOTE — Progress Notes (Signed)
Patient ambulated 400 feet x 2 this am without any assistive device. tolerated well. Bilateral groin level zero.

## 2019-07-13 NOTE — Progress Notes (Signed)
  Echocardiogram 2D Echocardiogram has been performed.  Charles Robles 07/13/2019, 8:55 AM

## 2019-07-13 NOTE — Discharge Summary (Addendum)
[name]  Discharge Summary    Patient ID: Charles Robles MRN: [id number]; DOB: [birth year]  Admit date: 07/12/2019 Discharge date: 07/13/2019  Primary Care Provider: Forrest Moron, MD  Primary Cardiologist: Candee Furbish, MD / Dr. Angelena Form & Dr. Cyndia Bent (TAVR)  Discharge Diagnoses    Principal Problem:   S/P TAVR (transcatheter aortic valve replacement) Active Problems:   HTN (hypertension)   Severe aortic stenosis   Hyperlipidemia   Pulmonary nodule   Allergies No Known Allergies  Diagnostic Studies/Procedures     TAVR OPERATIVE NOTE   Date of Procedure:                07/12/2019  Preoperative Diagnosis:      Severe Aortic Stenosis   Postoperative Diagnosis:    Same   Procedure:        Transcatheter Aortic Valve Replacement - Percutaneous Right Transfemoral Approach             Edwards Sapien 3 Ultra THV (size 26 mm, model # [id number], serial # [id number])              Co-Surgeons:                        Gaye Pollack, MD and Lauree Chandler, MD   Anesthesiologist:                  Suella Broad, MD  Echocardiographer:              Ena Dawley, MD  Pre-operative Echo Findings: ? Severe aortic stenosis ? Normal left ventricular systolic function  Post-operative Echo Findings: ? No paravalvular leak ? Normal left ventricular systolic function  _____________  Echo 07/13/19: completed but pending formal read at the time of discharge   History of Present Illness     Charles Robles is a 73 y.o. male with a history of HTN, HLD and severe AS who presented to [facility] on 07/12/19 for planned TAVR.   The patient was seen by Dr. Angelena Form in 05/2018 for evaluation of severe AS with a mean gradient of 38 mm Hg. He was asymptomatic and continued surveillance was recommended. He subsequently underwent cardiac catheterization in 06/2018 which showed mild nonobstructive coronary  disease. The mean gradient across aortic valve was 15.7 mmHg. He had an exercise tolerance test in 06/2018 that showed good exercise tolerance and no EKG changes. It was decided to continue following his aortic stenosis. Follow-up echo in 04/2019 showed EF 60-65% with severe LVH and progression of his aortic stenosis with a increase in the mean gradient of 45 mmHg. He reported a several month history of progressive exertional dyspnea and fatigue as well as occasional episodes of dizziness and chest discomfort.  The patient has been evaluated by the multidisciplinary valve team and felt to have severe, symptomatic aortic stenosis and to be a suitable candidate for TAVR, which was set up for 07/12/19.    Hospital Course     Consultants: none  Severe AS: s/p successful TAVR with a 26 mm Edwards Sapien 3 Ultra THV via the TF approach on 07/12/19. Post operative echo completed but pending formal read. Groin sites are stable. ECG with sinus and no high grade heart block. He will be discharged on asprin and plavix. I will see him back in the office next week for TOC visit. New meds, including amoxicillin for SBE prophylaxis, were brought to bedside  before discharge by [facility].   HTN: BP well controlled. Resume home lisinopril  HLD: resume home statin    Pulmonary nodule: 2 mm right lower lobe pulmonary nodule noted on pre TAVR CT. Highly non specific and statistically benign but repeat non contrast CT recommended at 12 months if patient is high risk. Will discuss as an outpatient.  _____________  Discharge Vitals Blood pressure 140/68, pulse 74, temperature 99.2 F (37.3 C), temperature source Oral, resp. rate 19, height 5\' 8"  (1.727 m), weight 77.1 kg, SpO2 99 %.  Filed Weights   07/12/19 0621 07/13/19 0502  Weight: 77.4 kg 77.1 kg    GEN: Well nourished, well developed, in no acute distress HEENT: normal Neck: no JVD or masses Cardiac: RRR; no murmurs, rubs, or gallops,no edema   Respiratory:  clear to auscultation bilaterally, normal work of breathing GI: soft, nontender, nondistended, + BS MS: no deformity or atrophy Skin: warm and dry, no rash Neuro:  Alert and Oriented x 3, Strength and sensation are intact Psych: euthymic mood, full affect    Labs & Radiologic Studies    CBC Recent Labs    07/12/19 0956 07/13/19 0243  WBC  --  7.8  HGB 12.2* 11.9*  HCT 36.0* 35.6*  MCV  --  96.7  PLT  --  [id number]   Basic Metabolic Panel Recent Labs    07/12/19 0956 07/13/19 0243  NA 140 133*  K 4.4 4.0  CL 106 104  CO2  --  21*  GLUCOSE 105* 109*  BUN 12 13  CREATININE 1.00 1.08  CALCIUM  --  8.3*  MG  --  2.1   Liver Function Tests No results for input(s): AST, ALT, ALKPHOS, BILITOT, PROT, ALBUMIN in the last 72 hours. No results for input(s): LIPASE, AMYLASE in the last 72 hours. Cardiac Enzymes No results for input(s): CKTOTAL, CKMB, CKMBINDEX, TROPONINI in the last 72 hours. BNP Invalid input(s): POCBNP D-Dimer No results for input(s): DDIMER in the last 72 hours. Hemoglobin A1C No results for input(s): HGBA1C in the last 72 hours. Fasting Lipid Panel No results for input(s): CHOL, HDL, LDLCALC, TRIG, CHOLHDL, LDLDIRECT in the last 72 hours. Thyroid Function Tests No results for input(s): TSH, T4TOTAL, T3FREE, THYROIDAB in the last 72 hours.  Invalid input(s): FREET3 _____________  Dg Chest 2 View  Result Date: 07/08/2019 CLINICAL DATA:  Severe aortic stenosis.  Pre-admission evaluation. EXAM: CHEST - 2 VIEW COMPARISON:  04/13/2014 chest radiograph. FINDINGS: Stable cardiomediastinal silhouette with normal heart size. No pneumothorax. No pleural effusion. Lungs appear clear, with no acute consolidative airspace disease and no pulmonary edema. IMPRESSION: No active cardiopulmonary disease. Electronically Signed   By: Ilona Sorrel M.D.   On: 07/08/2019 17:25   Dg Chest Port 1 View  Result Date: 07/12/2019 CLINICAL DATA:  TAVR. EXAM: PORTABLE  CHEST 1 VIEW COMPARISON:  Chest x-ray 07/08/2019. FINDINGS: Aortic valve replacement. Heart size normal. No pulmonary venous congestion. Low lung volumes. No focal alveolar infiltrate. Tiny left pleural effusion cannot be excluded. No pneumothorax. IMPRESSION: 1. Aortic valve replacement. Heart size normal. No pulmonary venous congestion. 2. No acute pulmonary infiltrate noted tiny left pleural effusion cannot be excluded. Electronically Signed   By: Marcello Moores  Register   On: 07/12/2019 11:56   Disposition   Pt is being discharged home today in good condition.  Follow-up Plans & Appointments    Follow-up Information    Eileen Stanford, PA-C. Go on 07/20/2019.   Specialties: Cardiology, Radiology Why: @  1:30pm, please arrive at least 10 minutes early. Contact information: [address] [place]  [address] [phone]            Discharge Medications   Allergies as of 07/13/2019   No Known Allergies     Medication List    STOP taking these medications   aspirin 325 MG tablet Replaced by: aspirin 81 MG chewable tablet   metoprolol tartrate 50 MG tablet Commonly known as: LOPRESSOR     TAKE these medications   amoxicillin 500 MG tablet Commonly known as: AMOXIL Take 4 tablets (2,000 mg total) by mouth as directed. Take 4 tablets 1 hour prior to dental work, including cleanings.   aspirin 81 MG chewable tablet Chew 1 tablet (81 mg total) by mouth daily. Start taking on: July 14, 2019 Replaces: aspirin 325 MG tablet   atorvastatin 40 MG tablet Commonly known as: LIPITOR Take 1 tablet (40 mg total) by mouth daily.   cetirizine 10 MG tablet Commonly known as: ZYRTEC Take 10 mg by mouth daily as needed for allergies.   clopidogrel 75 MG tablet Commonly known as: PLAVIX Take 1 tablet (75 mg total) by mouth daily with breakfast. Start taking on: July 14, 2019   doxylamine (Sleep) 25 MG tablet Commonly known as: UNISOM Take 25 mg by mouth at  bedtime.   fish oil-omega-3 fatty acids 1000 MG capsule Take 1 g by mouth daily.   Glucosamine 500 MG Caps Take 500 mg by mouth daily.   lisinopril 20 MG tablet Commonly known as: ZESTRIL Take 1 tablet (20 mg total) by mouth daily.   oxymetazoline 0.05 % nasal spray Commonly known as: AFRIN Place 1-2 sprays into both nostrils 2 (two) times daily as needed for congestion.   pseudoephedrine 120 MG 12 hr tablet Commonly known as: SUDAFED Take 120 mg by mouth daily as needed for congestion.        Outstanding Labs/Studies   none  Duration of Discharge Encounter   Greater than 30 minutes including physician time.  Signed, Angelena Form, PA-C 07/13/2019, 10:00 AM [phone]  I have personally seen and examined this patient. I agree with the assessment and plan as outlined above.  He is doing well post op day one post TAVR. Groins stable. BP stable. Sinus rhythm. NO high grade AV block.  Labs stable.  EKG with sinus.  Echo pending.  Will d/c home today if echo findings are stable.   Castin Campillo 07/13/2019 10:42 AM

## 2019-07-14 ENCOUNTER — Telehealth: Payer: Self-pay | Admitting: Physician Assistant

## 2019-07-14 NOTE — Telephone Encounter (Signed)
  [place] VALVE TEAM   Patient contacted regarding discharge from [facility] on 8/26  Patient understands to follow up with provider Nell Range on 9/2 at [facility].  Patient understands discharge instructions? yes Patient understands medications and regimen? yes Patient understands to bring all medications to this visit? Yes  Having some worsening bruising extending into his left leg. He did trip into a hole yesterday and hit that side of his body. His groin area is not having issues.   Angelena Form PA-C  [facility]

## 2019-07-15 MED FILL — Potassium Chloride Inj 2 mEq/ML: INTRAVENOUS | Qty: 40 | Status: AC

## 2019-07-15 MED FILL — Heparin Sodium (Porcine) Inj 1000 Unit/ML: INTRAMUSCULAR | Qty: 30 | Status: AC

## 2019-07-15 MED FILL — Magnesium Sulfate Inj 50%: INTRAMUSCULAR | Qty: 10 | Status: AC

## 2019-07-18 NOTE — Progress Notes (Signed)
[facility]                                       Cardiology Office Note    Date:  07/20/2019   ID:  [facility], DOB 04/22/1946, MRN ID:[id number]  PCP:  Charles Moron, MD  Cardiologist: Charles Furbish, MD / Dr. Angelena Robles & Dr. Cyndia Robles (TAVR)  CC: [facility] s/p TAVR  History of Present Illness:  [facility] is a 73 y.o. male with a history of  HTN, HLD, pulmonary nodule and severe AS s/p TAVR (07/12/19) who presents to clinic for follow up.   The patient was seen by Dr. Angelena Robles in 05/2018 for evaluation of severe AS with a mean gradient of 38 mm Hg. He was asymptomatic and continued surveillance was recommended. He subsequently underwent cardiac catheterization in 06/2018 which showed mild nonobstructive coronary disease. The mean gradient across aortic valve was 15.7 mmHg.He had an exercise tolerance test in 06/2018 that showed good exercise tolerance and no EKG changes.It was decided to continue following his aortic stenosis. Follow-up echo in 04/2019 showed EF 60-65% with severe LVH and progression of his aortic stenosis with a increase in the mean gradient of 45 mmHg. He reported a several month history of progressive exertional dyspnea and fatigue as well as occasional episodes of dizziness and chest discomfort. He was evaluated by the multidisciplinary valve team and underwent successful TAVR with a 26 mm Edwards Sapien 3 Ultra THV via the TF approach on 07/12/19. Post op echo showed EF 60-65%, normally functioning TAVR with a mean gradient of 8 mm Hg and no PVL. He was discharged on aspirin and plavix.   Today he presents to clinic for follow up. He has been at [facility] with his brother. He had a mechanical fall in a pot hole and has some bruising on right hip/ He has had some allergies recently and feeling a little more fatigued. Overall, the thinks he feels much better since having his valve done. No CP or SOB. No LE edema,  orthopnea or PND. No dizziness or syncope. No blood in stool or urine. No palpitations.   Past Medical History:  Diagnosis Date   Abnormal large bowel motility    Arthritis    Hearing loss    Hyperlipidemia    Hypertension    Melanoma ([place])    melanoma   Other fatigue    Pulmonary nodule    a. noted on pre TAVR CT, will need 1 year follow up given smoking history    S/P TAVR (transcatheter aortic valve replacement)    a. s/p TAVR with a 26 mm Edwards sapein 3 Ultra valve via the TF approach on 07/12/19   Severe aortic stenosis    a. s/p TAVR with a 26 mm Edwards sapein 3 Ultra valve via the TF approach on 07/12/19   Stool incontinence    Urgency of urination    Vision loss of right eye     Past Surgical History:  Procedure Laterality Date   COLONOSCOPY  2017   EYE SURGERY  1981    Right eye- corneal transplant   HERNIA REPAIR     x3   POLYPECTOMY     RIGHT/LEFT Charles CATH AND CORONARY ANGIOGRAPHY N/A 06/23/2018   Procedure: RIGHT/LEFT Charles CATH AND CORONARY ANGIOGRAPHY;  Surgeon: [facility] Blanks, MD;  Location:  [facility];  Service: Cardiovascular;  Laterality: N/A;   TEE WITHOUT CARDIOVERSION N/A 07/12/2019   Procedure: TRANSESOPHAGEAL ECHOCARDIOGRAM (TEE);  Surgeon: [facility] Blanks, MD;  Location: [facility];  Service: Open Charles Surgery;  Laterality: N/A;   TONSILLECTOMY     TRANSCATHETER AORTIC VALVE REPLACEMENT, TRANSFEMORAL N/A 07/12/2019   Procedure: TRANSCATHETER AORTIC VALVE REPLACEMENT, TRANSFEMORAL;  Surgeon: [facility] Blanks, MD;  Location: [facility];  Service: Open Charles Surgery;  Laterality: N/A;    Current Medications: Outpatient Medications Prior to Visit  Medication Sig Dispense Refill   amoxicillin (AMOXIL) 500 MG tablet Take 4 tablets (2,000 mg total) by mouth as directed. Take 4 tablets 1 hour prior to dental work, including cleanings. 12 tablet 12   aspirin 81 MG chewable tablet Chew 1 tablet (81 mg total) by  mouth daily. 90 tablet 1   atorvastatin (LIPITOR) 40 MG tablet Take 1 tablet (40 mg total) by mouth daily. 90 tablet 3   cetirizine (ZYRTEC) 10 MG tablet Take 10 mg by mouth daily as needed for allergies.     clopidogrel (PLAVIX) 75 MG tablet Take 1 tablet (75 mg total) by mouth daily with breakfast. 90 tablet 1   doxylamine, Sleep, (UNISOM) 25 MG tablet Take 25 mg by mouth at bedtime.     fish oil-omega-3 fatty acids 1000 MG capsule Take 1 g by mouth daily.      Glucosamine 500 MG CAPS Take 500 mg by mouth daily.      lisinopril (PRINIVIL,ZESTRIL) 20 MG tablet Take 1 tablet (20 mg total) by mouth daily. 90 tablet 3   oxymetazoline (AFRIN) 0.05 % nasal spray Place 1-2 sprays into both nostrils 2 (two) times daily as needed for congestion.     pseudoephedrine (SUDAFED) 120 MG 12 hr tablet Take 120 mg by mouth daily as needed for congestion.     No Robles-administered medications prior to visit.      Allergies:   Patient has no known allergies.   Social History   Socioeconomic History   Marital status: Divorced    Spouse name: Not on file   Number of children: 0   Years of education: Not on file   Highest education level: Not on file  Occupational History   Occupation: Retired-Psychologist  Social Needs   Financial resource strain: Not on file   Food insecurity    Worry: Not on file    Inability: Not on file   Transportation needs    Medical: Not on file    Non-medical: Not on file  Tobacco Use   Smoking status: Current Some Day Smoker    Types: Cigars   Smokeless tobacco: Never Used  Substance and Sexual Activity   Alcohol use: Yes    Alcohol/week: 14.0 standard drinks    Types: 14 Cans of beer per week    Comment:  occasional wine; about 2 beers/day   Drug use: Yes    Types: Marijuana    Comment: 1-2 week   Sexual activity: Never  Lifestyle   Physical activity    Days per week: Not on file    Minutes per session: Not on file   Stress: Not  on file  Relationships   Social connections    Talks on phone: Not on file    Gets together: Not on file    Attends religious service: Not on file    Active member of club or organization: Not on file    Attends meetings of clubs  or organizations: Not on file    Relationship status: Not on file  Other Topics Concern   Not on file  Social History Narrative   Not on file     Family History:  The patient's family history includes Alcohol abuse in his mother; Charles disease in his father; Hyperlipidemia in his brother and father; Hypertension in his brother and mother.     ROS:   Please see the history of present illness.    ROS All other systems reviewed and are negative.   PHYSICAL EXAM:   VS:  BP 125/79    Pulse 79    Ht 5\' 8"  (1.727 m)    Wt 170 lb 6.4 oz (77.3 kg)    BMI 25.91 kg/m    GEN: Well nourished, well developed, in no acute distress HEENT: normal Neck: no JVD or masses Cardiac: RRR; no murmurs, rubs, or gallops,no edema  Respiratory:  clear to auscultation bilaterally, normal work of breathing GI: soft, nontender, nondistended, + BS MS: no deformity or atrophy Skin: warm and dry, no rash. Groin sites have extensive ecchymosis and small (quarter sized) non tender mass on left groin with no bruit Neuro:  Alert and Oriented x 3, Strength and sensation are intact Psych: euthymic mood, full affect   Wt Readings from Last 3 Encounters:  07/20/19 170 lb 6.4 oz (77.3 kg)  07/13/19 169 lb 15.6 oz (77.1 kg)  07/08/19 170 lb 9.6 oz (77.4 kg)      Studies/Labs Reviewed:   EKG:  EKG is ordered today.  The ekg ordered today demonstrates sinus with HR 79  Recent Labs: 02/21/2019: TSH 1.340 07/08/2019: ALT 21; B Natriuretic Peptide 78.7 07/13/2019: BUN 13; Creatinine, Ser 1.08; Hemoglobin 11.9; Magnesium 2.1; Platelets 130; Potassium 4.0; Sodium 133   Lipid Panel    Component Value Date/Time   CHOL 275 (H) 02/21/2019 1140   TRIG 125 02/21/2019 1140   HDL 89  02/21/2019 1140   CHOLHDL 3.1 02/21/2019 1140   CHOLHDL 3.4 01/16/2016 1207   VLDL 38 (H) 01/16/2016 1207   LDLCALC 161 (H) 02/21/2019 1140    Additional studies/ records that were reviewed today include:   TAVR OPERATIVE NOTE   Date of Procedure:07/12/2019  Preoperative Diagnosis:Severe Aortic Stenosis   Postoperative Diagnosis:Same   Procedure:   Transcatheter Aortic Valve Replacement - PercutaneousRightTransfemoral Approach Edwards Sapien 3 Ultra THV (size 57mm, model # 9750TFX, serial # [id number])  Co-Surgeons:Bryan Alveria Apley, MD and Lauree Chandler, MD   Anesthesiologist:Kevin Smith Robert, MD  Dala Dock, MD  Pre-operative Echo Findings: ? Severe aortic stenosis ? Normalleft ventricular systolic function  Post-operative Echo Findings: ? Noparavalvular leak ? Normalleft ventricular systolic function  _____________  Echo 07/13/19: IMPRESSIONS  1. - TAVR: A 26 mm Sapien 3 TAVR valve is present in the aortic position. The V max is 2.1 cm/s, peak/mean gradients are 16/7 mmHG. AVA not reliable due to aliasing across LVOT due to basal septal hypertrophy and resultant large LVOT VTI. There is no  paravalvular leak/regurgitation detected. This is a normally functioning prosthetic valve.  2. The left ventricle has normal systolic function with an ejection fraction of 60-65%. The cavity size was small. Asymmetric basal hypertrophy present. Left ventricular diastolic Doppler parameters are consistent with impaired relaxation. No evidence  of left ventricular regional wall motion abnormalities.  3. The right ventricle has normal systolic function. The cavity was normal. There is no increase in right ventricular wall thickness. Right ventricular systolic pressure is normal with an estimated  pressure of 31.4 mmHg.  4. The mitral  valve is grossly normal. There is mild mitral annular calcification present. No evidence of mitral valve stenosis.  5. A 26 Edwards Sapien bioprosthetic aortic valve (TAVR) valve is present in the aortic position. Procedure Date: 07/12/2019 Normal aortic valve prosthesis.  6. When compared to the prior study: TAVR valve now present.  7. The aorta is normal unless otherwise noted.  8. The aortic root is normal in size and structure.   ASSESSMENT & PLAN:   Severe AS s/p TAVR:doing well. Has ecchymosis at groin sites bilaterally, but no pain or bruit. ECG with no HAVB. Continue on aspirin and plavix. SBE prophylaxis discussed; I have RX'd amoxicillin. I will see him back in 2 weeks for echo and virtual visit.   HTN: BP well controlled     HLD: continue statin    Pulmonary nodule: 2 mm right lower lobe pulmonary nodule noted on pre TAVR CT. Highly non specific and statistically benign but repeat non contrast CT recommended at 12 months if patient is high risk. I will have this arranged for 1 year apt   Medication Adjustments/Labs and Tests Ordered: Current medicines are reviewed at length with the patient today.  Concerns regarding medicines are outlined above.  Medication changes, Labs and Tests ordered today are listed in the Patient Instructions below. Patient Instructions  Medication Instructions:  Your provider recommends that you continue on your current medications as directed. Please refer to the Current Medication list given to you today.    Follow-Up: Please keep your appointments for your echocardiogram on August 03, 2019. Please arrive by 12:50PM for this visit.  You will have a follow up VIRTUAL VISIT (you will NOT come to the office - Nell Range will call you) on Thursday, September 17 at 3:00PM.  YOUR CARDIOLOGY TEAM HAS ARRANGED FOR AN E-VISIT FOR YOUR APPOINTMENT - PLEASE REVIEW IMPORTANT INFORMATION BELOW SEVERAL DAYS PRIOR TO YOUR APPOINTMENT  Due to the recent  COVID-19 pandemic, we are transitioning in-person office visits to tele-medicine visits in an effort to decrease unnecessary exposure to our patients, their families, and staff. These visits are billed to your insurance just like a normal visit is. We also encourage you to sign up for MyChart if you have not already done so. You will need a smartphone if possible. For patients that do not have this, we can still complete the visit using a regular telephone but do prefer a smartphone to enable video when possible. You may have a family member that lives with you that can help. If possible, we also ask that you have a blood pressure cuff and scale at home to measure your blood pressure, Charles rate and weight prior to your scheduled appointment. Patients with clinical needs that need an in-person evaluation and testing will still be able to come to the office if absolutely necessary. If you have any questions, feel free to call our office.  THE DAY OF YOUR APPOINTMENT  Approximately 15 minutes prior to your scheduled appointment, you will receive a telephone call from one of [facility] team - your caller ID may say "Unknown caller."  Our staff will confirm medications, vital signs for the day and any symptoms you may be experiencing. Please have this information available prior to the time of visit start. It may also be helpful for you to have a pad of paper and pen handy for any instructions given during your visit. They will also walk you through joining  the smartphone meeting if this is a video visit.  CONSENT FOR TELE-HEALTH VISIT - PLEASE REVIEW  I hereby voluntarily request, consent and authorize [facility] and its employed or contracted physicians, physician assistants, nurse practitioners or other licensed health care professionals (the Practitioner), to provide me with telemedicine health care services (the Services") as deemed necessary by the treating Practitioner. I acknowledge and consent to  receive the Services by the Practitioner via telemedicine. I understand that the telemedicine visit will involve communicating with the Practitioner through live audiovisual communication technology and the disclosure of certain medical information by electronic transmission. I acknowledge that I have been given the opportunity to request an in-person assessment or other available alternative prior to the telemedicine visit and am voluntarily participating in the telemedicine visit.  I understand that I have the right to withhold or withdraw my consent to the use of telemedicine in the course of my care at any time, without affecting my right to future care or treatment, and that the Practitioner or I may terminate the telemedicine visit at any time. I understand that I have the right to inspect all information obtained and/or recorded in the course of the telemedicine visit and may receive copies of available information for a reasonable fee.  I understand that some of the potential risks of receiving the Services via telemedicine include:   Delay or interruption in medical evaluation due to technological equipment failure or disruption;  Information transmitted may not be sufficient (e.g. poor resolution of images) to allow for appropriate medical decision making by the Practitioner; and/or   In rare instances, security protocols could fail, causing a breach of personal health information.  Furthermore, I acknowledge that it is my responsibility to provide information about my medical history, conditions and care that is complete and accurate to the best of my ability. I acknowledge that Practitioner's advice, recommendations, and/or decision may be based on factors not within their control, such as incomplete or inaccurate data provided by me or distortions of diagnostic images or specimens that may result from electronic transmissions. I understand that the practice of medicine is not an exact science  and that Practitioner makes no warranties or guarantees regarding treatment outcomes. I acknowledge that I will receive a copy of this consent concurrently upon execution via email to the email address I last provided but may also request a printed copy by calling the office of [facility].    I understand that my insurance will be billed for this visit.   I have read or had this consent read to me.  I understand the contents of this consent, which adequately explains the benefits and risks of the Services being provided via telemedicine.   I have been provided ample opportunity to ask questions regarding this consent and the Services and have had my questions answered to my satisfaction.  I give my informed consent for the services to be provided through the use of telemedicine in my medical care    Signed, Charles Form, PA-C  07/20/2019 2:13 PM    [address], [place], [place]  [address] Phone: [phone]; Fax: [phone]

## 2019-07-20 ENCOUNTER — Encounter: Payer: Self-pay | Admitting: Physician Assistant

## 2019-07-20 ENCOUNTER — Other Ambulatory Visit: Payer: Self-pay

## 2019-07-20 ENCOUNTER — Ambulatory Visit (INDEPENDENT_AMBULATORY_CARE_PROVIDER_SITE_OTHER): Payer: Medicare Other | Admitting: Physician Assistant

## 2019-07-20 VITALS — BP 125/79 | HR 79 | Ht 68.0 in | Wt 170.4 lb

## 2019-07-20 DIAGNOSIS — I10 Essential (primary) hypertension: Secondary | ICD-10-CM | POA: Diagnosis not present

## 2019-07-20 DIAGNOSIS — Z952 Presence of prosthetic heart valve: Secondary | ICD-10-CM | POA: Diagnosis not present

## 2019-07-20 DIAGNOSIS — E7800 Pure hypercholesterolemia, unspecified: Secondary | ICD-10-CM

## 2019-07-20 DIAGNOSIS — R911 Solitary pulmonary nodule: Secondary | ICD-10-CM

## 2019-07-20 NOTE — Patient Instructions (Signed)
Medication Instructions:  Your provider recommends that you continue on your current medications as directed. Please refer to the Current Medication list given to you today.    Follow-Up: Please keep your appointments for your echocardiogram on August 03, 2019. Please arrive by 12:50PM for this visit.  You will have a follow up VIRTUAL VISIT (you will NOT come to the office - Nell Range will call you) on Thursday, September 17 at 3:00PM.  YOUR CARDIOLOGY TEAM HAS ARRANGED FOR AN E-VISIT FOR YOUR APPOINTMENT - PLEASE REVIEW IMPORTANT INFORMATION BELOW SEVERAL DAYS PRIOR TO YOUR APPOINTMENT  Due to the recent COVID-19 pandemic, we are transitioning in-person office visits to tele-medicine visits in an effort to decrease unnecessary exposure to our patients, their families, and staff. These visits are billed to your insurance just like a normal visit is. We also encourage you to sign up for MyChart if you have not already done so. You will need a smartphone if possible. For patients that do not have this, we can still complete the visit using a regular telephone but do prefer a smartphone to enable video when possible. You may have a family member that lives with you that can help. If possible, we also ask that you have a blood pressure cuff and scale at home to measure your blood pressure, heart rate and weight prior to your scheduled appointment. Patients with clinical needs that need an in-person evaluation and testing will still be able to come to the office if absolutely necessary. If you have any questions, feel free to call our office.  THE DAY OF YOUR APPOINTMENT  Approximately 15 minutes prior to your scheduled appointment, you will receive a telephone call from one of [facility] team - your caller ID may say "Unknown caller."  Our staff will confirm medications, vital signs for the day and any symptoms you may be experiencing. Please have this information available prior to the time of  visit start. It may also be helpful for you to have a pad of paper and pen handy for any instructions given during your visit. They will also walk you through joining the smartphone meeting if this is a video visit.  CONSENT FOR TELE-HEALTH VISIT - PLEASE REVIEW  I hereby voluntarily request, consent and authorize [facility] and its employed or contracted physicians, physician assistants, nurse practitioners or other licensed health care professionals (the Practitioner), to provide me with telemedicine health care services (the "Services") as deemed necessary by the treating Practitioner. I acknowledge and consent to receive the Services by the Practitioner via telemedicine. I understand that the telemedicine visit will involve communicating with the Practitioner through live audiovisual communication technology and the disclosure of certain medical information by electronic transmission. I acknowledge that I have been given the opportunity to request an in-person assessment or other available alternative prior to the telemedicine visit and am voluntarily participating in the telemedicine visit.  I understand that I have the right to withhold or withdraw my consent to the use of telemedicine in the course of my care at any time, without affecting my right to future care or treatment, and that the Practitioner or I may terminate the telemedicine visit at any time. I understand that I have the right to inspect all information obtained and/or recorded in the course of the telemedicine visit and may receive copies of available information for a reasonable fee.  I understand that some of the potential risks of receiving the Services via telemedicine include:  [place] Delay  or interruption in medical evaluation due to technological equipment failure or disruption; . Information transmitted may not be sufficient (e.g. poor resolution of images) to allow for appropriate medical decision making by the Practitioner;  and/or  . In rare instances, security protocols could fail, causing a breach of personal health information.  Furthermore, I acknowledge that it is my responsibility to provide information about my medical history, conditions and care that is complete and accurate to the best of my ability. I acknowledge that Practitioner's advice, recommendations, and/or decision may be based on factors not within their control, such as incomplete or inaccurate data provided by me or distortions of diagnostic images or specimens that may result from electronic transmissions. I understand that the practice of medicine is not an exact science and that Practitioner makes no warranties or guarantees regarding treatment outcomes. I acknowledge that I will receive a copy of this consent concurrently upon execution via email to the email address I last provided but may also request a printed copy by calling the office of [facility].    I understand that my insurance will be billed for this visit.   I have read or had this consent read to me. . I understand the contents of this consent, which adequately explains the benefits and risks of the Services being provided via telemedicine.  . I have been provided ample opportunity to ask questions regarding this consent and the Services and have had my questions answered to my satisfaction. . I give my informed consent for the services to be provided through the use of telemedicine in my medical care

## 2019-08-01 NOTE — Progress Notes (Signed)
[facility]                                       Cardiology Office Note    Date:  08/03/2019   ID:  [name], DOB Nov 06, 1946, MRN ID:[id number]  PCP:  Forrest Moron, MD  Cardiologist: Candee Furbish, MD / Dr. Angelena Form & Dr. Cyndia Bent (TAVR)  CC: 1 month s/p TAVR  History of Present Illness:  Jahaire Harwell is a 73 y.o. male with a history of  HTN, HLD, pulmonary nodule and severe AS s/p TAVR (07/12/19) who presents to clinic for follow up.   The patient was seen by Dr. Angelena Form in 05/2018 for evaluation of severe AS with a mean gradient of 38 mm Hg. He was asymptomatic and continued surveillance was recommended. He subsequently underwent cardiac catheterization in 06/2018 which showed mild nonobstructive coronary disease. The mean gradient across aortic valve was 15.7 mmHg.He had an exercise tolerance test in 06/2018 that showed good exercise tolerance and no EKG changes.It was decided to continue following his aortic stenosis. Follow-up echo in 04/2019 showed EF 60-65% with severe LVH and progression of his aortic stenosis with a increase in the mean gradient of 45 mmHg. He reported a several month history of progressive exertional dyspnea and fatigue as well as occasional episodes of dizziness and chest discomfort. He was evaluated by the multidisciplinary valve team and underwent successful TAVR with a 26 mm Edwards Sapien 3 Ultra THV via the TF approach on 07/12/19. Post op echo showed EF 60-65%, normally functioning TAVR with a mean gradient of 8 mm Hg and no PVL. He was discharged on aspirin and plavix.   Today he presents to clinic for follow up. No CP or SOB. No LE edema, orthopnea or PND. No dizziness or syncope. No blood in stool or urine. No palpitations. Able to do all that he wants but sometimes feels tired the next day. Stays very active with no issues. Has a red and itchy rash in his groin that burns.   Past  Medical History:  Diagnosis Date  . Abnormal large bowel motility   . Arthritis   . Hearing loss   . Hyperlipidemia   . Hypertension   . Melanoma ([place])    melanoma  . Other fatigue   . Pulmonary nodule    a. noted on pre TAVR CT, will need 1 year follow up given smoking history   . S/P TAVR (transcatheter aortic valve replacement)    a. s/p TAVR with a 26 mm Edwards sapein 3 Ultra valve via the TF approach on 07/12/19  . Severe aortic stenosis    a. s/p TAVR with a 26 mm Edwards sapein 3 Ultra valve via the TF approach on 07/12/19  . Stool incontinence   . Urgency of urination   . Vision loss of right eye     Past Surgical History:  Procedure Laterality Date  . COLONOSCOPY  2017  . EYE SURGERY  1981    Right eye- corneal transplant  . HERNIA REPAIR     x3  . POLYPECTOMY    . RIGHT/LEFT HEART CATH AND CORONARY ANGIOGRAPHY N/A 06/23/2018   Procedure: RIGHT/LEFT HEART CATH AND CORONARY ANGIOGRAPHY;  Surgeon: Burnell Blanks, MD;  Location: [facility];  Service: Cardiovascular;  Laterality: N/A;  . TEE WITHOUT CARDIOVERSION N/A  07/12/2019   Procedure: TRANSESOPHAGEAL ECHOCARDIOGRAM (TEE);  Surgeon: Burnell Blanks, MD;  Location: [facility];  Service: Open Heart Surgery;  Laterality: N/A;  . TONSILLECTOMY    . TRANSCATHETER AORTIC VALVE REPLACEMENT, TRANSFEMORAL N/A 07/12/2019   Procedure: TRANSCATHETER AORTIC VALVE REPLACEMENT, TRANSFEMORAL;  Surgeon: Burnell Blanks, MD;  Location: [facility];  Service: Open Heart Surgery;  Laterality: N/A;    Current Medications: Outpatient Medications Prior to Visit  Medication Sig Dispense Refill  . amoxicillin (AMOXIL) 500 MG tablet Take 4 tablets (2,000 mg total) by mouth as directed. Take 4 tablets 1 hour prior to dental work, including cleanings. 12 tablet 12  . aspirin 81 MG chewable tablet Chew 1 tablet (81 mg total) by mouth daily. 90 tablet 1  . atorvastatin (LIPITOR) 40 MG tablet Take 1 tablet (40 mg total) by  mouth daily. 90 tablet 3  . cetirizine (ZYRTEC) 10 MG tablet Take 10 mg by mouth daily as needed for allergies.    Marland Kitchen clopidogrel (PLAVIX) 75 MG tablet Take 1 tablet (75 mg total) by mouth daily with breakfast. 90 tablet 1  . doxylamine, Sleep, (UNISOM) 25 MG tablet Take 25 mg by mouth at bedtime.    . fish oil-omega-3 fatty acids 1000 MG capsule Take 1 g by mouth daily.     Marland Kitchen lisinopril (PRINIVIL,ZESTRIL) 20 MG tablet Take 1 tablet (20 mg total) by mouth daily. 90 tablet 3  . oxymetazoline (AFRIN) 0.05 % nasal spray Place 1-2 sprays into both nostrils 2 (two) times daily as needed for congestion.    . pseudoephedrine (SUDAFED) 120 MG 12 hr tablet Take 120 mg by mouth daily as needed for congestion.    . Glucosamine 500 MG CAPS Take 500 mg by mouth daily.      No facility-administered medications prior to visit.      Allergies:   Patient has no known allergies.   Social History   Socioeconomic History  . Marital status: Divorced    Spouse name: Not on file  . Number of children: 0  . Years of education: Not on file  . Highest education level: Not on file  Occupational History  . Occupation: Retired-Psychologist  Social Needs  . Financial resource strain: Not on file  . Food insecurity    Worry: Not on file    Inability: Not on file  . Transportation needs    Medical: Not on file    Non-medical: Not on file  Tobacco Use  . Smoking status: Current Some Day Smoker    Types: Cigars  . Smokeless tobacco: Never Used  Substance and Sexual Activity  . Alcohol use: Yes    Alcohol/week: 14.0 standard drinks    Types: 14 Cans of beer per week    Comment:  occasional wine; about 2 beers/day  . Drug use: Yes    Types: Marijuana    Comment: 1-2 week  . Sexual activity: Never  Lifestyle  . Physical activity    Days per week: Not on file    Minutes per session: Not on file  . Stress: Not on file  Relationships  . Social Herbalist on phone: Not on file    Gets together:  Not on file    Attends religious service: Not on file    Active member of club or organization: Not on file    Attends meetings of clubs or organizations: Not on file    Relationship status: Not on file  Other Topics  Concern  . Not on file  Social History Narrative  . Not on file     Family History:  The patient's family history includes Alcohol abuse in his mother; Heart disease in his father; Hyperlipidemia in his brother and father; Hypertension in his brother and mother.     ROS:   Please see the history of present illness.    ROS All other systems reviewed and are negative.   PHYSICAL EXAM:   VS:  BP 116/78   Pulse 69   Ht 5\' 8"  (1.727 m)   Wt 170 lb (77.1 kg)   SpO2 98%   BMI 25.85 kg/m    GEN: Well nourished, well developed, in no acute distress HEENT: normal Neck: no JVD or masses Cardiac: RRR; no murmurs, rubs, or gallops,no edema  Respiratory:  clear to auscultation bilaterally, normal work of breathing GI: soft, nontender, nondistended, + BS MS: no deformity or atrophy Skin: warm and dry, no rash. Red rash in groin with erythematous satellite lesions Neuro:  Alert and Oriented x 3, Strength and sensation are intact Psych: euthymic mood, full affect   Wt Readings from Last 3 Encounters:  08/03/19 170 lb (77.1 kg)  07/20/19 170 lb 6.4 oz (77.3 kg)  07/13/19 169 lb 15.6 oz (77.1 kg)      Studies/Labs Reviewed:   EKG:  EKG is NOT ordered today.   Recent Labs: 02/21/2019: TSH 1.340 07/08/2019: ALT 21; B Natriuretic Peptide 78.7 07/13/2019: BUN 13; Creatinine, Ser 1.08; Hemoglobin 11.9; Magnesium 2.1; Platelets 130; Potassium 4.0; Sodium 133   Lipid Panel    Component Value Date/Time   CHOL 275 (H) 02/21/2019 1140   TRIG 125 02/21/2019 1140   HDL 89 02/21/2019 1140   CHOLHDL 3.1 02/21/2019 1140   CHOLHDL 3.4 01/16/2016 1207   VLDL 38 (H) 01/16/2016 1207   LDLCALC 161 (H) 02/21/2019 1140    Additional studies/ records that were reviewed today include:    TAVR OPERATIVE NOTE   Date of Procedure:07/12/2019  Preoperative Diagnosis:Severe Aortic Stenosis   Postoperative Diagnosis:Same   Procedure:   Transcatheter Aortic Valve Replacement - PercutaneousRightTransfemoral Approach Edwards Sapien 3 Ultra THV (size 76mm, model # 9750TFX, serial # [id number])  Co-Surgeons:Bryan Alveria Apley, MD and Lauree Chandler, MD   Anesthesiologist:Kevin Smith Robert, MD  Dala Dock, MD  Pre-operative Echo Findings: ? Severe aortic stenosis ? Normalleft ventricular systolic function  Post-operative Echo Findings: ? Noparavalvular leak ? Normalleft ventricular systolic function  _____________  Echo 07/13/19: IMPRESSIONS  1. - TAVR: A 26 mm Sapien 3 TAVR valve is present in the aortic position. The V max is 2.1 cm/s, peak/mean gradients are 16/7 mmHG. AVA not reliable due to aliasing across LVOT due to basal septal hypertrophy and resultant large LVOT VTI. There is no  paravalvular leak/regurgitation detected. This is a normally functioning prosthetic valve.  2. The left ventricle has normal systolic function with an ejection fraction of 60-65%. The cavity size was small. Asymmetric basal hypertrophy present. Left ventricular diastolic Doppler parameters are consistent with impaired relaxation. No evidence  of left ventricular regional wall motion abnormalities.  3. The right ventricle has normal systolic function. The cavity was normal. There is no increase in right ventricular wall thickness. Right ventricular systolic pressure is normal with an estimated pressure of 31.4 mmHg.  4. The mitral valve is grossly normal. There is mild mitral annular calcification present. No evidence of mitral valve stenosis.  5. A 26 Edwards Sapien bioprosthetic aortic valve (TAVR) valve is present  in the aortic position.  Procedure Date: 07/12/2019 Normal aortic valve prosthesis.  6. When compared to the prior study: TAVR valve now present.  7. The aorta is normal unless otherwise noted.  8. The aortic root is normal in size and structure.  _______________  Echo 08/03/19 IMPRESSIONS  1. The left ventricle has normal systolic function with an ejection fraction of 60-65%. The cavity size was normal. Left ventricular diastolic Doppler parameters are consistent with impaired relaxation. No evidence of left ventricular regional wall  motion abnormalities.  2. The right ventricle has normal systolic function. The cavity was mildly enlarged. There is No increase in right ventricular wall thickness.  3. Left atrial size was normal.  4. Right atrial size was normal.  5. No evidence of mitral valve stenosis.  6. Peak TAVR velocity 2.23m/s, mean 77mmHg, AVA 2.7cm2.  7. Pulmonic valve regurgitation is not visualized by color flow Doppler.  8. The aorta is normal unless otherwise noted.  FINDINGS  Left Ventricle: The left ventricle has normal systolic function, with an ejection fraction of 60-65%. The cavity size was normal. There is no increase in left ventricular wall thickness. Left ventricular diastolic Doppler parameters are consistent with  impaired relaxation. No evidence of left ventricular regional wall motion abnormalities..   Aortic Valve: The aortic valve has been repaired/replaced Aortic valve regurgitation was not visualized by color flow Doppler. Peak TAVR velocity 2.17m/s, mean 92mmHg, AVA 2.7cm2.   ASSESSMENT & PLAN:   Severe AS s/p TAVR: echo today shows EF 60-65%, normally functioning TAVR with a mean gradient of 19 mm Hg and no PVL. He has NYHA class I symptoms. SBE prophylaxis discussed; he has amoxicillin. Plavix can be discontinued after 6 months of therapy. Continue on aspirin 81mg  daily indefinitely. I will see him back in 1 year with echo.   HTN: BP well controlled   HLD: continue  statin    Pulmonary nodule: 2 mm right lower lobe pulmonary nodule noted on pre TAVR CT. Highly non specific and statistically benign but repeat non contrast CT recommended at 12 months if patient is high risk. I will have this arranged for 1 year apt  Groin rash: looks like yeast to me. Will Rx Nystatin. If this does not work I have asked him to see his PCP  Medication Adjustments/Labs and Tests Ordered: Current medicines are reviewed at length with the patient today.  Concerns regarding medicines are outlined above.  Medication changes, Labs and Tests ordered today are listed in the Patient Instructions below. Patient Instructions  Medication Instructions:  1) START NYSTATIN CREAM twice daily to affected area. If you do not get any relief, please consult your primary. 2) You may STOP PLAVIX 6 months from your procedure (this should be around 01/12/2020)  Labwork: None  Follow-Up: Your provider wants you to follow-up in: 4 months with Dr. Marlou Porch. You will receive a reminder letter in the mail two months in advance. If you don't receive a letter, please call our office to schedule the follow-up appointment.    You are scheduled for a chest CT, echocardiogram, and 1 year TAVR visit with Nell Range on 07/04/2020. Please arrive by 1:15PM for these appointments. All are done at [facility] on [address], [address] [address], [place]  08/03/2019 8:42 PM    [address], [place], [place]  [address] Phone: [phone]; Fax: [phone]

## 2019-08-03 ENCOUNTER — Ambulatory Visit (HOSPITAL_COMMUNITY): Payer: Medicare Other | Attending: Cardiology

## 2019-08-03 ENCOUNTER — Ambulatory Visit (INDEPENDENT_AMBULATORY_CARE_PROVIDER_SITE_OTHER): Payer: Medicare Other | Admitting: Physician Assistant

## 2019-08-03 ENCOUNTER — Other Ambulatory Visit: Payer: Self-pay

## 2019-08-03 ENCOUNTER — Encounter: Payer: Self-pay | Admitting: Physician Assistant

## 2019-08-03 VITALS — BP 116/78 | HR 69 | Ht 68.0 in | Wt 170.0 lb

## 2019-08-03 DIAGNOSIS — Z952 Presence of prosthetic heart valve: Secondary | ICD-10-CM | POA: Diagnosis not present

## 2019-08-03 DIAGNOSIS — I10 Essential (primary) hypertension: Secondary | ICD-10-CM | POA: Diagnosis not present

## 2019-08-03 DIAGNOSIS — E7800 Pure hypercholesterolemia, unspecified: Secondary | ICD-10-CM | POA: Diagnosis not present

## 2019-08-03 DIAGNOSIS — R911 Solitary pulmonary nodule: Secondary | ICD-10-CM | POA: Diagnosis not present

## 2019-08-03 MED ORDER — NYSTATIN 100000 UNIT/GM EX CREA: 1.0000 "application " | TOPICAL_CREAM | Freq: Two times a day (BID) | CUTANEOUS | 1 refills | Status: DC

## 2019-08-03 NOTE — Patient Instructions (Addendum)
Medication Instructions:  1) START NYSTATIN CREAM twice daily to affected area. If you do not get any relief, please consult your primary. 2) You may STOP PLAVIX 6 months from your procedure (this should be around 01/12/2020)  Labwork: None  Follow-Up: Your provider wants you to follow-up in: 4 months with Dr. Marlou Porch. You will receive a reminder letter in the mail two months in advance. If you don't receive a letter, please call our office to schedule the follow-up appointment.    You are scheduled for a chest CT, echocardiogram, and 1 year TAVR visit with Nell Range on 07/04/2020. Please arrive by 1:15PM for these appointments. All are done at [facility] on [facility]

## 2019-08-04 ENCOUNTER — Telehealth: Payer: Medicare Other | Admitting: Physician Assistant

## 2019-08-04 ENCOUNTER — Other Ambulatory Visit: Payer: Self-pay | Admitting: Physician Assistant

## 2019-08-08 ENCOUNTER — Other Ambulatory Visit: Payer: Self-pay | Admitting: Physician Assistant

## 2019-08-08 MED ORDER — NYSTATIN 100000 UNIT/GM EX CREA: 1.0000 "application " | TOPICAL_CREAM | Freq: Two times a day (BID) | CUTANEOUS | 1 refills | Status: DC

## 2019-09-09 ENCOUNTER — Encounter (INDEPENDENT_AMBULATORY_CARE_PROVIDER_SITE_OTHER): Payer: Self-pay

## 2019-11-10 ENCOUNTER — Telehealth: Payer: Self-pay | Admitting: Physician Assistant

## 2019-11-10 NOTE — Telephone Encounter (Signed)
Called and made patient aware. 

## 2019-11-10 NOTE — Telephone Encounter (Signed)
  [facility]   MULTIDISCIPLINARY HEART VALVE TEAM  Patient called structural heart office to see if Dr. Angelena Form or Dr. Marlou Porch could call him in a Rx for baclofen for back pain. Structural heart team is out of the office until next week. Can you please call the patient and let him know that he will likely need to see his PCP for a medication like this. Thank you  KT

## 2019-11-30 ENCOUNTER — Ambulatory Visit: Payer: Medicare Other | Admitting: Cardiology

## 2019-12-22 ENCOUNTER — Ambulatory Visit (INDEPENDENT_AMBULATORY_CARE_PROVIDER_SITE_OTHER): Payer: Medicare Other | Admitting: Sports Medicine

## 2019-12-22 ENCOUNTER — Other Ambulatory Visit: Payer: Self-pay

## 2019-12-22 DIAGNOSIS — M25522 Pain in left elbow: Secondary | ICD-10-CM | POA: Diagnosis not present

## 2019-12-22 DIAGNOSIS — T148XXA Other injury of unspecified body region, initial encounter: Secondary | ICD-10-CM | POA: Insufficient documentation

## 2019-12-22 DIAGNOSIS — M25529 Pain in unspecified elbow: Secondary | ICD-10-CM | POA: Insufficient documentation

## 2019-12-22 NOTE — Progress Notes (Signed)
   Charles Robles is a 74 y.o. male who presents to [facility] today for the following:  Right back pain Patient presenting with right back pain after slipping on ice 2 weeks ago.  States he was walking down the stairs to go to his hospital bed when he slipped and fell.  Bumped down 5 stairs.  Hit mostly his back.  Does think he also hit his left side because he had some bruising in his left inner arm.  Denies any loss of consciousness.  Was able to get up directly after the fall.  Does note some minor bruising.  States he has still been active chopping wood.  Also states he tries to walk daily at least a half a mile to a mile.  Pain is mainly in his mid back on his right side.  Sometimes can radiate all across his back.  Denies any incontinence or fevers.  L elbow pain Along with back pain after the fall he also reports some left elbow pain.  It is on his medial aspect.  Had some bruising in the area during the fall that extended down his forearm.  PMH reviewed. TAVR 6 mos.ago, degenerative arthritis, home meds of plavix and ASA ROS as above. Medications reviewed.  Exam:  BP (!) 152/82   Ht 5' 8.5" (1.74 m)   BMI 25.47 kg/m  Gen: Well NAD MSK: Thoracic/Lumbar spine: - Inspection: no gross deformity or asymmetry, swelling or ecchymosis. Some bruising on right lower back around ribs 10 - Palpation: No TTP over the spinous processes, paraspinal muscles, or SI joints b/l. No tenderness to palpation of ribs  - ROM: full active ROM of the lumbar and thoracic spine in flexion and extension without pain - Strength: normal gait - Neuro: sensation intact   Elbow: - Inspection: no obvious deformity. No swelling, erythema or bruising - Palpation: No TTP except at tip of olecranon - ROM: full active ROM in flexion and extension. No crepitus - Strength: 5/5 strength in wrist flexion and extension without pain. 5/5 strength in biceps, triceps. - Neuro: NV intact distally  Ultrasound In area  of Right low back there is hypoechoic change in deep muscle layers that appears circular like a hematoma. Ribs look intact  At tip of olecranon there is some hypoechoic change and small calcifications   Assessment and Plan: 1) Hematoma Right lower back around rib 10 with hematoma noted on ultrasound.  This is consistent with his area of pain.  Patient is on Plavix and aspirin at home which likely accounts for bleeding.  Advised to use heat as needed.  Also advised voltaren gel and arnaca. Follow up as needed  Elbow pain Patient with left elbow pain.  Ultrasound showing a chip off the tip of the olecranon.  Advised that this will likely need time to heal.  Follow-up if no improvement.   Dalphine Handing, PGY-3 [facility] Family [facility] Resident  12/22/2019 4:05 PM   I observed and examined the patient with the resident and agree with assessment and plan.  Note reviewed and modified by me. Ila Mcgill, MD  I spent 26 minutes with this patient. Over 50% of visit was spent in counseling and coordination of care for problems with hematoma and elbow pain. Ila Mcgill, MD

## 2019-12-22 NOTE — Assessment & Plan Note (Signed)
Right lower back around rib 10 with hematoma noted on ultrasound.  This is consistent with his area of pain.  Patient is on Plavix and aspirin at home which likely accounts for bleeding.  Advised to use heat as needed.  Also advised voltaren gel and arnaca. Follow up as needed

## 2019-12-22 NOTE — Assessment & Plan Note (Signed)
Patient with left elbow pain.  Ultrasound showing a chip off the tip of the olecranon.  Advised that this will likely need time to heal.  Follow-up if no improvement.

## 2019-12-27 ENCOUNTER — Ambulatory Visit: Payer: Medicare Other | Admitting: Sports Medicine

## 2019-12-28 ENCOUNTER — Other Ambulatory Visit: Payer: Self-pay

## 2019-12-28 ENCOUNTER — Encounter: Payer: Self-pay | Admitting: Cardiology

## 2019-12-28 ENCOUNTER — Ambulatory Visit (INDEPENDENT_AMBULATORY_CARE_PROVIDER_SITE_OTHER): Payer: Medicare Other | Admitting: Cardiology

## 2019-12-28 VITALS — BP 144/80 | HR 80 | Ht 68.5 in | Wt 170.0 lb

## 2019-12-28 DIAGNOSIS — Z952 Presence of prosthetic heart valve: Secondary | ICD-10-CM

## 2019-12-28 DIAGNOSIS — I350 Nonrheumatic aortic (valve) stenosis: Secondary | ICD-10-CM | POA: Diagnosis not present

## 2019-12-28 DIAGNOSIS — I10 Essential (primary) hypertension: Secondary | ICD-10-CM

## 2019-12-28 DIAGNOSIS — E7800 Pure hypercholesterolemia, unspecified: Secondary | ICD-10-CM

## 2019-12-28 NOTE — Progress Notes (Signed)
Cardiology Office Note:    Date:  12/28/2019   ID:  Hines Carraher, DOB [birth year], MRN ID:[id number]  PCP:  Forrest Moron, MD  Cardiologist:  Candee Furbish, MD  Electrophysiologist:  None   Referring MD: Forrest Moron, MD     History of Present Illness:    Charles Robles is a 74 y.o. male with TAVR 07/12/2019 with hypertension hyperlipidemia pulmonary nodule.  Dr. Angelena Form performed procedure.  26 mm Edwards sapient 3 ultra THV via the transfemoral approach.  Postop echo EF normal with mean gradient of 8 mmHg.  No perivalvular leak.  Overall doing well no syncope no chest pain no fevers chills nausea vomiting   Past Medical History:  Diagnosis Date  . Abnormal large bowel motility   . Arthritis   . Hearing loss   . Hyperlipidemia   . Hypertension   . Melanoma ([place])    melanoma  . Other fatigue   . Pulmonary nodule    a. noted on pre TAVR CT, will need 1 year follow up given smoking history   . S/P TAVR (transcatheter aortic valve replacement)    a. s/p TAVR with a 26 mm Edwards sapein 3 Ultra valve via the TF approach on 07/12/19  . Severe aortic stenosis    a. s/p TAVR with a 26 mm Edwards sapein 3 Ultra valve via the TF approach on 07/12/19  . Stool incontinence   . Urgency of urination   . Vision loss of right eye     Past Surgical History:  Procedure Laterality Date  . COLONOSCOPY  2017  . EYE SURGERY  1981    Right eye- corneal transplant  . HERNIA REPAIR     x3  . POLYPECTOMY    . RIGHT/LEFT HEART CATH AND CORONARY ANGIOGRAPHY N/A 06/23/2018   Procedure: RIGHT/LEFT HEART CATH AND CORONARY ANGIOGRAPHY;  Surgeon: Burnell Blanks, MD;  Location: [place] [facility];  Service: Cardiovascular;  Laterality: N/A;  . TEE WITHOUT CARDIOVERSION N/A 07/12/2019   Procedure: TRANSESOPHAGEAL ECHOCARDIOGRAM (TEE);  Surgeon: Burnell Blanks, MD;  Location: [place];  Service: Open Heart Surgery;  Laterality: N/A;  . TONSILLECTOMY    .  TRANSCATHETER AORTIC VALVE REPLACEMENT, TRANSFEMORAL N/A 07/12/2019   Procedure: TRANSCATHETER AORTIC VALVE REPLACEMENT, TRANSFEMORAL;  Surgeon: Burnell Blanks, MD;  Location: [place];  Service: Open Heart Surgery;  Laterality: N/A;    Current Medications: Current Meds  Medication Sig  . amoxicillin (AMOXIL) 500 MG tablet Take 4 tablets (2,000 mg total) by mouth as directed. Take 4 tablets 1 hour prior to dental work, including cleanings.  Marland Kitchen aspirin 81 MG chewable tablet Chew 1 tablet (81 mg total) by mouth daily.  . cetirizine (ZYRTEC) 10 MG tablet Take 10 mg by mouth daily as needed for allergies.  Marland Kitchen clopidogrel (PLAVIX) 75 MG tablet Take 1 tablet (75 mg total) by mouth daily with breakfast.  . doxylamine, Sleep, (UNISOM) 25 MG tablet Take 25 mg by mouth at bedtime.  . fish oil-omega-3 fatty acids 1000 MG capsule Take 1 g by mouth daily.   Marland Kitchen lisinopril (PRINIVIL,ZESTRIL) 20 MG tablet Take 1 tablet (20 mg total) by mouth daily.  Marland Kitchen nystatin cream (MYCOSTATIN) Apply 1 application topically 2 (two) times daily.  Marland Kitchen oxymetazoline (AFRIN) 0.05 % nasal spray Place 1-2 sprays into both nostrils 2 (two) times daily as needed for congestion.  . pseudoephedrine (SUDAFED) 120 MG 12 hr tablet Take 120 mg by mouth daily as needed for  congestion.     Allergies:   Patient has no known allergies.   Social History   Socioeconomic History  . Marital status: Divorced    Spouse name: Not on file  . Number of children: 0  . Years of education: Not on file  . Highest education level: Not on file  Occupational History  . Occupation: Retired-Psychologist  Tobacco Use  . Smoking status: Current Some Day Smoker    Types: Cigars  . Smokeless tobacco: Never Used  Substance and Sexual Activity  . Alcohol use: Yes    Alcohol/week: 14.0 standard drinks    Types: 14 Cans of beer per week    Comment:  occasional wine; about 2 beers/day  . Drug use: Yes    Types: Marijuana    Comment: 1-2 week  .  Sexual activity: Never  Other Topics Concern  . Not on file  Social History Narrative  . Not on file   Social Determinants of Health   Financial Resource Strain:   . Difficulty of Paying Living Expenses: Not on file  Food Insecurity:   . Worried About Charity fundraiser in the Last Year: Not on file  . Ran Out of Food in the Last Year: Not on file  Transportation Needs:   . Lack of Transportation (Medical): Not on file  . Lack of Transportation (Non-Medical): Not on file  Physical Activity:   . Days of Exercise per Week: Not on file  . Minutes of Exercise per Session: Not on file  Stress:   . Feeling of Stress : Not on file  Social Connections:   . Frequency of Communication with Friends and Family: Not on file  . Frequency of Social Gatherings with Friends and Family: Not on file  . Attends Religious Services: Not on file  . Active Member of Clubs or Organizations: Not on file  . Attends Archivist Meetings: Not on file  . Marital Status: Not on file     Family History: The patient's family history includes Alcohol abuse in his mother; Heart disease in his father; Hyperlipidemia in his brother and father; Hypertension in his brother and mother. There is no history of Colon cancer, Colon polyps, Rectal cancer, Stomach cancer, or Esophageal cancer.  ROS:   Please see the history of present illness.     All other systems reviewed and are negative.  EKGs/Labs/Other Studies Reviewed:    The following studies were reviewed today: Prior echocardiograms reviewed  EKG:  EKG is not ordered today.  Prior reviewed, sinus rhythm  Recent Labs: 02/21/2019: TSH 1.340 07/08/2019: ALT 21; B Natriuretic Peptide 78.7 07/13/2019: BUN 13; Creatinine, Ser 1.08; Hemoglobin 11.9; Magnesium 2.1; Platelets 130; Potassium 4.0; Sodium 133  Recent Lipid Panel    Component Value Date/Time   CHOL 275 (H) 02/21/2019 1140   TRIG 125 02/21/2019 1140   HDL 89 02/21/2019 1140   CHOLHDL 3.1  02/21/2019 1140   CHOLHDL 3.4 01/16/2016 1207   VLDL 38 (H) 01/16/2016 1207   LDLCALC 161 (H) 02/21/2019 1140    Physical Exam:    VS:  BP (!) 144/80   Pulse 80   Ht 5' 8.5" (1.74 m)   Wt 170 lb (77.1 kg)   SpO2 98%   BMI 25.47 kg/m     Wt Readings from Last 3 Encounters:  12/28/19 170 lb (77.1 kg)  08/03/19 170 lb (77.1 kg)  07/20/19 170 lb 6.4 oz (77.3 kg)     GEN:  Well nourished, well developed in no acute distress HEENT: Normal NECK: No JVD; No carotid bruits LYMPHATICS: No lymphadenopathy CARDIAC: RRR, 1/6 systolic murmur, rubs, gallops RESPIRATORY:  Clear to auscultation without rales, wheezing or rhonchi  ABDOMEN: Soft, non-tender, non-distended MUSCULOSKELETAL:  No edema; No deformity  SKIN: Warm and dry NEUROLOGIC:  Alert and oriented x 3 PSYCHIATRIC:  Normal affect   ASSESSMENT:    1. Severe aortic stenosis   2. S/P TAVR (transcatheter aortic valve replacement)   3. Essential hypertension   4. Pure hypercholesterolemia    PLAN:    In order of problems listed above:  TAVR, prior severe aortic stenosis -19 mmHg mean gradient.  Currently has class I symptoms.  Doing well.  Dental prophylaxis.  Plavix will be discontinued after 6 months continued on aspirin 81 indefinite.  Echo planned for September 2021.  Essential hypertension -Continue with good overall control.  Encouraged him to check occasionally at home.  No changes made.  If necessary, could always increase his antihypertensive regiment.  Hyperlipidemia -Prior LDL 161 on 02/21/2019, creatinine 1.08, ALT 21.  I discussed in the past but not interested in statin therapy.  Diet exercise.  Pulmonary nodule -2 mm right lower lobe CT recommended at 12 months.  This has been arranged for 1 year appointment by Nell Range  Encouraged him to sign up for Covid vaccine.     I will see back in 1 year.  Joellen Jersey will be seen back in 6 months.  He may stop his Plavix in 2 weeks.  Medication  Adjustments/Labs and Tests Ordered: Current medicines are reviewed at length with the patient today.  Concerns regarding medicines are outlined above.  No orders of the defined types were placed in this encounter.  No orders of the defined types were placed in this encounter.   Patient Instructions  Medication Instructions:  You may stop your Plavix in 2 weeks.  Continue all other medications as listed.  *If you need a refill on your cardiac medications before your next appointment, please call your pharmacy*  Follow-Up: At [facility], you and your health needs are our priority.  As part of our continuing mission to provide you with exceptional heart care, we have created designated Provider Care Teams.  These Care Teams include your primary Cardiologist (physician) and Advanced Practice Providers (APPs -  Physician Assistants and Nurse Practitioners) who all work together to provide you with the care you need, when you need it.  Your next appointment:   12 month(s)  The format for your next appointment:   In Person  Provider:   Candee Furbish, MD  Thank you for choosing [facility]!!        Signed, Candee Furbish, MD  12/28/2019 2:16 PM    [place]

## 2019-12-28 NOTE — Patient Instructions (Signed)
Medication Instructions:  You may stop your Plavix in 2 weeks.  Continue all other medications as listed.  *If you need a refill on your cardiac medications before your next appointment, please call your pharmacy*  Follow-Up: At [facility], you and your health needs are our priority.  As part of our continuing mission to provide you with exceptional heart care, we have created designated Provider Care Teams.  These Care Teams include your primary Cardiologist (physician) and Advanced Practice Providers (APPs -  Physician Assistants and Nurse Practitioners) who all work together to provide you with the care you need, when you need it.  Your next appointment:   12 month(s)  The format for your next appointment:   In Person  Provider:   Candee Furbish, MD  Thank you for choosing [facility]!!

## 2020-01-12 ENCOUNTER — Ambulatory Visit (INDEPENDENT_AMBULATORY_CARE_PROVIDER_SITE_OTHER): Payer: Medicare Other | Admitting: Gastroenterology

## 2020-01-12 ENCOUNTER — Encounter: Payer: Self-pay | Admitting: Gastroenterology

## 2020-01-12 ENCOUNTER — Other Ambulatory Visit: Payer: Self-pay

## 2020-01-12 VITALS — BP 126/68 | HR 78 | Temp 98.3°F | Ht 68.5 in | Wt 168.0 lb

## 2020-01-12 DIAGNOSIS — Z86010 Personal history of colonic polyps: Secondary | ICD-10-CM

## 2020-01-12 DIAGNOSIS — Z7902 Long term (current) use of antithrombotics/antiplatelets: Secondary | ICD-10-CM

## 2020-01-12 DIAGNOSIS — N401 Enlarged prostate with lower urinary tract symptoms: Secondary | ICD-10-CM | POA: Diagnosis not present

## 2020-01-12 DIAGNOSIS — R194 Change in bowel habit: Secondary | ICD-10-CM

## 2020-01-12 DIAGNOSIS — R350 Frequency of micturition: Secondary | ICD-10-CM | POA: Diagnosis not present

## 2020-01-12 MED ORDER — FIBER CHOICE 1.5 G PO CHEW: CHEWABLE_TABLET | ORAL | 0 refills | Status: DC

## 2020-01-12 MED ORDER — TAMSULOSIN HCL 0.4 MG PO CAPS: 0.4000 mg | ORAL_CAPSULE | Freq: Every day | ORAL | 0 refills | Status: DC

## 2020-01-12 MED ORDER — SUPREP BOWEL PREP KIT 17.5-3.13-1.6 GM/177ML PO SOLN: ORAL | 0 refills | Status: DC

## 2020-01-12 NOTE — Progress Notes (Signed)
HPI :  74 year old male here for follow-up visit for history of colon polyps and history of severe aortic stenosis.  Recall that he had a colonoscopy in July 2017, this was remarkable for a 25 mm ascending colon sessile serrated polyp removed in piecemeal, in addition to 7 other precancerous polyps, largest of these was 10 mm.  I had recommended a short surveillance colonoscopy given the largest polyp that was removed however he had symptomatic severe aortic stenosis which delayed this.  At her last visit he was quite symptomatic from the aortic stenosis and was recommended to have therapy for that prior to proceeding with his colonoscopy.  He underwent a TAVR in August 2020.  He did well with this, his symptoms have resolved, he has no dyspnea.  He has been on Plavix since that procedure and is told he can stop at this point now that he is 6 months out from his procedure.  He is maintained on aspirin daily. Post op echo showed EF 60-65%, normally functioning TAVR with a mean gradient of 8 mm Hg and no PVL.  He denies any abdominal pains.  He does have some regularity with his bowel habits however.  He states some stools are normal formed, others are quite soft and not formed well enough.  No bleeding.  He asked about what he can do regarding his bowels.  He otherwise states he has had some issues with his prostate gland recently.  He states he is having significant problems at night with urination and thinks is due to his prostate.  He is spoken with his friends about this and is asking for a prescription or trial of Flomax.  He is not followed by a urologist.  Most recent Endoscopic studies:  7 / 2017 Screening colonoscopy:  Nodular mucosa in the cecum. Suspect benign lymphoid aggregates but biopsied to ensure no adenomatous change. - One 25 mm polyp in the ascending colon, removed piecemeal using a cold snare. Resected and retrieved. Tattooed. - One 8 mm polyp in the ascending colon, removed  with a cold snare. Resected and retrieved. - Three 3 to 5 mm polyps at the hepatic flexure, removed with a cold snare. Resected and retrieved. - One 10 mm polyp in the transverse colon, removed with a cold snare. Resected and retrieved. - One 5 mm polyp in the transverse colon, removed with a cold snare. Resected and retrieved. - One 3 mm polyp in the rectum, removed with a cold snare. Resected and retrieved. - Diverticulosis in the sigmoid colon. - Non-bleeding internal hemorrhoids. - The examined portion of the ileum was normal. - The examination was otherwise normal.  Surgical [P], hepatic flexure, ascending, transverse, rectal, polyp (7) - TUBULAR ADENOMA, THREE FRAGMENTS. - HYPERPLASTIC POLYP, TWO FRAGMENTS. - LEIOMYOMA, ONE FRAGMENT. - BENIGN COLORECTAL MUCOSA, REMAINING TISSUE. - NO HIGH GRADE DYSPLASIA OR MALIGNANCY IDENTIFIED. 2. Surgical [P], cecum, polyp - HYPERPLASTIC POLYP (X 1). - NO DYSPLASIA OR MALIGNANCY IDENTIFIED. 3. Surgical [P], ascending, polyp - TUBULAR ADENOMA, ONE FRAGMENT. - FRAGMENTS OF SESSILE SERRATED ADENOMA/POLYP WITHOUT CYTOLOGIC DYSPLASIA. - FRAGMENTS OF BENIGN COLORECTAL MUCOSA. - NO HIGH GRADE DYSPLASIA OR MALIGNANCY IDENTIFIED. - SEE COMMENT.     Past Medical History:  Diagnosis Date  . Abnormal large bowel motility   . Arthritis   . Hearing loss   . Hyperlipidemia   . Hypertension   . Melanoma ([place])    melanoma  . Other fatigue   . Pulmonary nodule    a.  noted on pre TAVR CT, will need 1 year follow up given smoking history   . S/P TAVR (transcatheter aortic valve replacement)    a. s/p TAVR with a 26 mm Edwards sapein 3 Ultra valve via the TF approach on 07/12/19  . Severe aortic stenosis    a. s/p TAVR with a 26 mm Edwards sapein 3 Ultra valve via the TF approach on 07/12/19  . Stool incontinence   . Urgency of urination   . Vision loss of right eye      Past Surgical History:  Procedure Laterality Date  . COLONOSCOPY  2017   . EYE SURGERY  1981    Right eye- corneal transplant  . HERNIA REPAIR     x3  . POLYPECTOMY    . RIGHT/LEFT HEART CATH AND CORONARY ANGIOGRAPHY N/A 06/23/2018   Procedure: RIGHT/LEFT HEART CATH AND CORONARY ANGIOGRAPHY;  Surgeon: Burnell Blanks, MD;  Location: [place] [facility];  Service: Cardiovascular;  Laterality: N/A;  . TEE WITHOUT CARDIOVERSION N/A 07/12/2019   Procedure: TRANSESOPHAGEAL ECHOCARDIOGRAM (TEE);  Surgeon: Burnell Blanks, MD;  Location: [place];  Service: Open Heart Surgery;  Laterality: N/A;  . TONSILLECTOMY    . TRANSCATHETER AORTIC VALVE REPLACEMENT, TRANSFEMORAL N/A 07/12/2019   Procedure: TRANSCATHETER AORTIC VALVE REPLACEMENT, TRANSFEMORAL;  Surgeon: Burnell Blanks, MD;  Location: [place];  Service: Open Heart Surgery;  Laterality: N/A;   Family History  Problem Relation Age of Onset  . Heart disease Father        MI  . Hyperlipidemia Father   . Hypertension Mother   . Alcohol abuse Mother   . Hyperlipidemia Brother   . Hypertension Brother   . Colon cancer Neg Hx   . Colon polyps Neg Hx   . Rectal cancer Neg Hx   . Stomach cancer Neg Hx   . Esophageal cancer Neg Hx    Social History   Tobacco Use  . Smoking status: Current Some Day Smoker    Types: Cigars  . Smokeless tobacco: Never Used  Substance Use Topics  . Alcohol use: Yes    Alcohol/week: 14.0 standard drinks    Types: 14 Cans of beer per week    Comment:  occasional wine; about 2 beers/day  . Drug use: Yes    Types: Marijuana    Comment: 1-2 week   Current Outpatient Medications  Medication Sig Dispense Refill  . amoxicillin (AMOXIL) 500 MG tablet Take 4 tablets (2,000 mg total) by mouth as directed. Take 4 tablets 1 hour prior to dental work, including cleanings. 12 tablet 12  . aspirin 81 MG chewable tablet Chew 1 tablet (81 mg total) by mouth daily. 90 tablet 1  . cetirizine (ZYRTEC) 10 MG tablet Take 10 mg by mouth daily as needed for allergies.    Marland Kitchen  clopidogrel (PLAVIX) 75 MG tablet Take 1 tablet (75 mg total) by mouth daily with breakfast. 90 tablet 1  . doxylamine, Sleep, (UNISOM) 25 MG tablet Take 25 mg by mouth at bedtime.    . fish oil-omega-3 fatty acids 1000 MG capsule Take 1 g by mouth daily.     Marland Kitchen lisinopril (PRINIVIL,ZESTRIL) 20 MG tablet Take 1 tablet (20 mg total) by mouth daily. 90 tablet 3  . nystatin cream (MYCOSTATIN) Apply 1 application topically 2 (two) times daily. 30 g 1  . oxymetazoline (AFRIN) 0.05 % nasal spray Place 1-2 sprays into both nostrils 2 (two) times daily as needed for congestion.  No current facility-administered medications for this visit.   No Known Allergies   Review of Systems: All systems reviewed and negative except where noted in HPI.   Lab Results  Component Value Date   ALT 21 07/08/2019   AST 23 07/08/2019   ALKPHOS 52 07/08/2019   BILITOT 0.5 07/08/2019     Physical Exam: BP 126/68   Pulse 78   Temp 98.3 F (36.8 C)   Ht 5' 8.5" (1.74 m)   Wt 168 lb (76.2 kg)   BMI 25.17 kg/m  Constitutional: Pleasant,well-developed, male in no acute distress. HEENT: Normocephalic and atraumatic. Conjunctivae are normal. No scleral icterus. Neck supple.  Cardiovascular: Normal rate, regular rhythm.  Pulmonary/chest: Effort normal and breath sounds normal. No wheezing, rales or rhonchi. Abdominal: Soft, nondistended, nontender. There are no masses palpable.  Extremities: no edema Lymphadenopathy: No cervical adenopathy noted. Neurological: Alert and oriented to person place and time. Skin: Skin is warm and dry. No rashes noted. Psychiatric: Normal mood and affect. Behavior is normal.   ASSESSMENT AND PLAN: 74 year old male here for reassessment following issues:  History of colon polyps / antiplatelet use -as above, advanced polyps removed in piecemeal fashion on his last colonoscopy, he is overdue for surveillance.  This was delayed due to severe aortic stenosis which required  intervention per cardiology.  He is now status post TAVR and has completed 6 months of antiplatelet therapy which he has been cleared to stop.  He has no cardiopulmonary symptoms and is postoperative echocardiogram looks good.  He is stable for colonoscopy at this time.  We discussed colonoscopy including risks and benefits of anesthesia and the exam, he wanted to proceed.  Further recommendations pending the results.  He can continue his aspirin around the time of the procedure.  Change in bowel habits - he has had some alternating changes in stool form which is concerning to him.  His colonoscopy is pending and will make sure everything is okay, I think unlikely we will find any significant abnormalities, however recommend a daily fiber supplement in the interim to see if that helps.  Provided him some samples of fiber one supplements to see if that works.  BPH - patient with symptoms that is consistent with BPH.  He is asking for a trial of Flomax.  I counseled that this can cause hypotension and lightheadedness.  Recommend we get through the colonoscopy first, scheduled for next week, will then give him a 30-day trial to see if this helps some.  I ultimately think he should touch base with his primary care about management of this long-term.  He agreed  I spent 30 minutes of time, including in depth chart review, independent review of results as outlined above, communicating results with the patient directly, face-to-face time with the patient, coordinating care, and ordering studies and medications as appropriate, and documenting this encounter.  Nittany Cellar, MD [facility]

## 2020-01-12 NOTE — Patient Instructions (Signed)
If you are age 75 or older, your body mass index should be between 23-30. Your Body mass index is 25.17 kg/m. If this is out of the aforementioned range listed, please consider follow up with your Primary Care Provider.  If you are age 57 or younger, your body mass index should be between 19-25. Your Body mass index is 25.17 kg/m. If this is out of the aformentioned range listed, please consider follow up with your Primary Care Provider.   You have been scheduled for a colonoscopy. Please follow written instructions given to you at your visit today.  Please pick up your prep supplies at the pharmacy within the next 1-3 days. If you use inhalers (even only as needed), please bring them with you on the day of your procedure.  We have sent the following medications to your pharmacy for you to pick up at your convenience: Flomax  We have given you samples of the following medication to take: Fiber Choice  Thank you for entrusting me with your care and for choosing [facility], Dr. Kenova Cellar

## 2020-01-15 ENCOUNTER — Ambulatory Visit: Payer: Medicare Other | Attending: Internal Medicine

## 2020-01-15 DIAGNOSIS — Z23 Encounter for immunization: Secondary | ICD-10-CM

## 2020-01-15 NOTE — Progress Notes (Signed)
   Covid-19 Vaccination Clinic  Name:  Dmarkus Mckaughan    MRN: ID:[id number] DOB: 07-Feb-1946  01/15/2020  Mr. Cuddihy was observed post Covid-19 immunization for 15 minutes without incidence. He was provided with Vaccine Information Sheet and instruction to access the V-Safe system.   Mr. Kajiwara was instructed to call 911 with any severe reactions post vaccine: Marland Kitchen Difficulty breathing  . Swelling of your face and throat  . A fast heartbeat  . A bad rash all over your body  . Dizziness and weakness    Immunizations Administered    Name Date Dose VIS Date Route   Pfizer COVID-19 Vaccine 01/15/2020  8:34 AM 0.3 mL 10/28/2019 Intramuscular   Manufacturer: [place]   Lot: [id number]   [place]: [id number]

## 2020-01-17 ENCOUNTER — Other Ambulatory Visit: Payer: Self-pay

## 2020-01-17 ENCOUNTER — Other Ambulatory Visit: Payer: Self-pay | Admitting: Gastroenterology

## 2020-01-17 ENCOUNTER — Ambulatory Visit (INDEPENDENT_AMBULATORY_CARE_PROVIDER_SITE_OTHER): Payer: Medicare Other

## 2020-01-17 DIAGNOSIS — Z1159 Encounter for screening for other viral diseases: Secondary | ICD-10-CM | POA: Diagnosis not present

## 2020-01-18 LAB — SARS CORONAVIRUS 2 (TAT 6-24 HRS): SARS Coronavirus 2: NEGATIVE

## 2020-01-19 ENCOUNTER — Encounter: Payer: Medicare Other | Admitting: Gastroenterology

## 2020-01-19 ENCOUNTER — Encounter: Payer: Self-pay | Admitting: Gastroenterology

## 2020-01-19 ENCOUNTER — Ambulatory Visit (AMBULATORY_SURGERY_CENTER): Payer: Medicare Other | Admitting: Gastroenterology

## 2020-01-19 ENCOUNTER — Other Ambulatory Visit: Payer: Self-pay

## 2020-01-19 VITALS — BP 151/67 | HR 63 | Temp 98.0°F | Resp 20 | Ht 68.5 in | Wt 168.0 lb

## 2020-01-19 DIAGNOSIS — D122 Benign neoplasm of ascending colon: Secondary | ICD-10-CM

## 2020-01-19 DIAGNOSIS — Z860100 Personal history of colon polyps, unspecified: Secondary | ICD-10-CM

## 2020-01-19 DIAGNOSIS — D123 Benign neoplasm of transverse colon: Secondary | ICD-10-CM

## 2020-01-19 DIAGNOSIS — D120 Benign neoplasm of cecum: Secondary | ICD-10-CM | POA: Diagnosis not present

## 2020-01-19 DIAGNOSIS — I10 Essential (primary) hypertension: Secondary | ICD-10-CM | POA: Diagnosis not present

## 2020-01-19 DIAGNOSIS — Z720 Tobacco use: Secondary | ICD-10-CM | POA: Diagnosis not present

## 2020-01-19 MED ORDER — SODIUM CHLORIDE 0.9 % IV SOLN: 500.0000 mL | Freq: Once | INTRAVENOUS | Status: DC

## 2020-01-19 NOTE — Progress Notes (Signed)
Called to room to assist during endoscopic procedure.  Patient ID and intended procedure confirmed with present staff. Received instructions for my participation in the procedure from the performing physician.  

## 2020-01-19 NOTE — Op Note (Signed)
[facility] Patient Name: Dejoun Karau Procedure Date: 01/19/2020 7:57 AM MRN: [id number] Endoscopist: Remo Lipps P. Havery Moros , MD Age: 74 Referring MD:  Date of Birth: 01-08-1946 Gender: Male Account #: [id number] Procedure:                Colonoscopy Indications:              High risk colon cancer surveillance: Personal                            history of sessile serrated colon polyp (33mm) 3                            years ago along with numerous other adenomas Medicines:                Monitored Anesthesia Care Procedure:                Pre-Anesthesia Assessment:                           - Prior to the procedure, a History and Physical                            was performed, and patient medications and                            allergies were reviewed. The patient's tolerance of                            previous anesthesia was also reviewed. The risks                            and benefits of the procedure and the sedation                            options and risks were discussed with the patient.                            All questions were answered, and informed consent                            was obtained. Prior Anticoagulants: The patient has                            taken no previous anticoagulant or antiplatelet                            agents. ASA Grade Assessment: III - A patient with                            severe systemic disease. After reviewing the risks                            and benefits, the patient was deemed in  satisfactory condition to undergo the procedure.                           After obtaining informed consent, the colonoscope                            was passed under direct vision. Throughout the                            procedure, the patient's blood pressure, pulse, and                            oxygen saturations were monitored continuously. The                             Colonoscope was introduced through the anus and                            advanced to the the cecum, identified by                            appendiceal orifice and ileocecal valve. The                            colonoscopy was performed without difficulty. The                            patient tolerated the procedure well. The quality                            of the bowel preparation was good. The ileocecal                            valve, appendiceal orifice, and rectum were                            photographed. Scope In: 8:02:17 AM Scope Out: 8:21:59 AM Scope Withdrawal Time: 0 hours 17 minutes 46 seconds  Total Procedure Duration: 0 hours 19 minutes 42 seconds  Findings:                 The perianal and digital rectal examinations were                            normal.                           A 10 mm polyp was found in the cecum. The polyp was                            flat and very subtle in appearance. The polyp was                            removed with a cold snare. Resection and retrieval  were complete.                           Two flat polyps were found in the ascending colon.                            The polyps were 3 to 7 mm in size. These polyps                            were removed with a cold snare. Resection and                            retrieval were complete.                           A 4 mm polyp was found in the hepatic flexure. The                            polyp was sessile. The polyp was removed with a                            cold snare. Resection and retrieval were complete.                           Three flat polyps were found in the transverse                            colon. The polyps were 3 to 4 mm in size. These                            polyps were removed with a cold snare. Resection                            and retrieval were complete.                           A few small-mouthed diverticula were  found in the                            sigmoid colon.                           Internal hemorrhoids were found during                            retroflexion. The hemorrhoids were small.                           There was a large post polypectomy scar in the                            ascending colon without residual polyp, tattoo  noted in close approximation. The exam was                            otherwise without abnormality. Complications:            No immediate complications. Estimated blood loss:                            Minimal. Estimated Blood Loss:     Estimated blood loss was minimal. Impression:               - One 10 mm polyp in the cecum, removed with a cold                            snare. Resected and retrieved.                           - Two 3 to 7 mm polyps in the ascending colon,                            removed with a cold snare. Resected and retrieved.                           - One 4 mm polyp at the hepatic flexure, removed                            with a cold snare. Resected and retrieved.                           - Three 3 to 4 mm polyps in the transverse colon,                            removed with a cold snare. Resected and retrieved.                           - Diverticulosis in the sigmoid colon.                           - Internal hemorrhoids.                           - The examination was otherwise normal. Recommendation:           - Patient has a contact number available for                            emergencies. The signs and symptoms of potential                            delayed complications were discussed with the                            patient. Return to normal activities tomorrow.  Written discharge instructions were provided to the                            patient.                           - Resume previous diet.                           - Continue present  medications.                           - Await pathology results. Remo Lipps P. Havery Moros, MD 01/19/2020 8:28:52 AM This report has been signed electronically.

## 2020-01-19 NOTE — Progress Notes (Signed)
Report to PACU, RN, vss, BBS= Clear.  

## 2020-01-19 NOTE — Patient Instructions (Signed)
Please, read all of the handouts given to you by your recovery room nurse.  Thank-you for choosing us for your healthcare needs today.  YOU HAD AN ENDOSCOPIC PROCEDURE TODAY AT THE [facility]:   Refer to the procedure report that was given to you for any specific questions about what was found during the examination.  If the procedure report does not answer your questions, please call your gastroenterologist to clarify.  If you requested that your care partner not be given the details of your procedure findings, then the procedure report has been included in a sealed envelope for you to review at your convenience later.  YOU SHOULD EXPECT: Some feelings of bloating in the abdomen. Passage of more gas than usual.  Walking can help get rid of the air that was put into your GI tract during the procedure and reduce the bloating. If you had a lower endoscopy (such as a colonoscopy or flexible sigmoidoscopy) you may notice spotting of blood in your stool or on the toilet paper. If you underwent a bowel prep for your procedure, you may not have a normal bowel movement for a few days.  Please Note:  You might notice some irritation and congestion in your nose or some drainage.  This is from the oxygen used during your procedure.  There is no need for concern and it should clear up in a day or so.  SYMPTOMS TO REPORT IMMEDIATELY:   Following lower endoscopy (colonoscopy or flexible sigmoidoscopy):  Excessive amounts of blood in the stool  Significant tenderness or worsening of abdominal pains  Swelling of the abdomen that is new, acute  Fever of 100F or higher   For urgent or emergent issues, a gastroenterologist can be reached at any hour by calling [phone]. Do not use MyChart messaging for urgent concerns.    DIET:  We do recommend a small meal at first, but then you may proceed to your regular diet.  Drink plenty of fluids but you should avoid alcoholic beverages for 24  hours.  ACTIVITY:  You should plan to take it easy for the rest of today and you should NOT DRIVE or use heavy machinery until tomorrow (because of the sedation medicines used during the test).    FOLLOW UP: Our staff will call the number listed on your records 48-72 hours following your procedure to check on you and address any questions or concerns that you may have regarding the information given to you following your procedure. If we do not reach you, we will leave a message.  We will attempt to reach you two times.  During this call, we will ask if you have developed any symptoms of COVID 19. If you develop any symptoms (ie: fever, flu-like symptoms, shortness of breath, cough etc.) before then, please call [phone].  If you test positive for Covid 19 in the 2 weeks post procedure, please call and report this information to us.    If any biopsies were taken you will be contacted by phone or by letter within the next 1-3 weeks.  Please call us at [phone] if you have not heard about the biopsies in 3 weeks.    SIGNATURES/CONFIDENTIALITY: You and/or your care partner have signed paperwork which will be entered into your electronic medical record.  These signatures attest to the fact that that the information above on your After Visit Summary has been reviewed and is understood.  Full responsibility of the confidentiality of this discharge   information lies with you and/or your care-partner. 

## 2020-01-19 NOTE — Progress Notes (Signed)
Temp JB Vitals KA

## 2020-01-23 ENCOUNTER — Telehealth: Payer: Self-pay

## 2020-01-23 NOTE — Telephone Encounter (Signed)
  Follow up Call-  Call back number 01/19/2020  Post procedure Call Back phone  # [phone]  Permission to leave phone message Yes  Some recent data might be hidden     Patient questions:  Do you have a fever, pain , or abdominal swelling? No. Pain Score  0 *  Have you tolerated food without any problems? Yes.    Have you been able to return to your normal activities? Yes.    Do you have any questions about your discharge instructions: Diet   No. Medications  No. Follow up visit  No.  Do you have questions or concerns about your Care? No.  Actions: * If pain score is 4 or above: No action needed, pain <4.  1. Have you developed a fever since your procedure? No  2.   Have you had an respiratory symptoms (SOB or cough) since your procedure? No  3.   Have you tested positive for COVID 19 since your procedure No  4.   Have you had any family members/close contacts diagnosed with the COVID 19 since your procedure?  No   If yes to any of these questions please route to Joylene John, RN and Alphonsa Gin, RN.

## 2020-01-24 ENCOUNTER — Encounter: Payer: Self-pay | Admitting: Gastroenterology

## 2020-02-08 ENCOUNTER — Ambulatory Visit: Payer: Medicare Other | Attending: Internal Medicine

## 2020-02-08 DIAGNOSIS — Z23 Encounter for immunization: Secondary | ICD-10-CM

## 2020-02-08 NOTE — Progress Notes (Signed)
   Covid-19 Vaccination Clinic  Name:  Charles Robles    MRN: [id number] DOB: 08/30/1946  02/08/2020  [name] was observed post Covid-19 immunization for 15 minutes without incident. He was provided with Vaccine Information Sheet and instruction to access the V-Safe system.   [name] was instructed to call 911 with any severe reactions post vaccine: Marland Kitchen Difficulty breathing  . Swelling of face and throat  . A fast heartbeat  . A bad rash all over body  . Dizziness and weakness   Immunizations Administered    Name Date Dose VIS Date Route   Pfizer COVID-19 Vaccine 02/08/2020  4:25 PM 0.3 mL 10/28/2019 Intramuscular   Manufacturer: [place]   Lot: [id number]   [place]: [id number]

## 2020-03-26 ENCOUNTER — Other Ambulatory Visit: Payer: Self-pay | Admitting: Family Medicine

## 2020-03-26 NOTE — Telephone Encounter (Signed)
Requested medication (s) are due for refill today: yes  Requested medication (s) are on the active medication list: yes  Last refill:  02/17/19  Future visit scheduled: no  Notes to clinic:  Called pt and left message to call office to schedule for f/u.   Requested Prescriptions  Pending Prescriptions Disp Refills   lisinopril (ZESTRIL) 20 MG tablet [Pharmacy Med Name: LISINOPRIL 20MG  TABLETS] 90 tablet 3    Sig: TAKE 1 TABLET BY MOUTH DAILY      Cardiovascular:  ACE Inhibitors Failed - 03/26/2020 11:55 AM      Failed - Cr in normal range and within 180 days    Creat  Date Value Ref Range Status  01/16/2016 0.98 0.70 - 1.18 mg/dL Final   Creatinine, Ser  Date Value Ref Range Status  07/13/2019 1.08 0.61 - 1.24 mg/dL Final          Failed - K in normal range and within 180 days    Potassium  Date Value Ref Range Status  07/13/2019 4.0 3.5 - 5.1 mmol/L Final          Failed - Last BP in normal range    BP Readings from Last 1 Encounters:  01/19/20 (!) 151/67          Failed - Valid encounter within last 6 months    Recent Outpatient Visits           1 year ago Dyslipidemia   [facility], Lilia Argue, MD   1 year ago Encounter for Medicare annual wellness exam   [facility], Lilia Argue, MD   3 years ago Impacted cerumen of both ears   [facility], Arlie Solomons, MD   3 years ago Impacted cerumen of both ears   [facility] at [facility], Arlie Solomons, MD   3 years ago Encounter for screening for malignant neoplasm of prostate   [facility], Arlie Solomons, MD       Future Appointments             In 3 months Eileen Stanford, PA-C [place], [place] - Patient is not pregnant

## 2020-03-26 NOTE — Telephone Encounter (Signed)
Please schedule patient an appt to be seen for any refills due DR Duane Lope is leaving

## 2020-03-27 ENCOUNTER — Other Ambulatory Visit: Payer: Self-pay

## 2020-03-27 ENCOUNTER — Encounter: Payer: Self-pay | Admitting: Family Medicine

## 2020-03-27 ENCOUNTER — Telehealth (INDEPENDENT_AMBULATORY_CARE_PROVIDER_SITE_OTHER): Payer: Medicare Other | Admitting: Family Medicine

## 2020-03-27 ENCOUNTER — Other Ambulatory Visit: Payer: Self-pay | Admitting: Family Medicine

## 2020-03-27 DIAGNOSIS — Z760 Encounter for issue of repeat prescription: Secondary | ICD-10-CM

## 2020-03-27 MED ORDER — LISINOPRIL 20 MG PO TABS: 20.0000 mg | ORAL_TABLET | Freq: Every day | ORAL | 0 refills | Status: DC

## 2020-03-27 NOTE — Patient Instructions (Signed)
° ° ° °  If you have lab work done today you will be contacted with your lab results within the next 2 weeks.  If you have not heard from us then please contact us. The fastest way to get your results is to register for My Chart. ° ° °IF you received an x-ray today, you will receive an invoice from [facility]. Please contact [facility] at [phone] with questions or concerns regarding your invoice.  ° °IF you received labwork today, you will receive an invoice from [facility]. Please contact [facility] at [phone] with questions or concerns regarding your invoice.  ° °Our billing staff will not be able to assist you with questions regarding bills from these companies. ° °You will be contacted with the lab results as soon as they are available. The fastest way to get your results is to activate your My Chart account. Instructions are located on the last page of this paperwork. If you have not heard from us regarding the results in 2 weeks, please contact this office. °  ° ° ° °

## 2020-03-27 NOTE — Progress Notes (Signed)
Pt indicated that he only needs his lisinopril. He needs labs and a bp check.  This was not a visit that was suitable for virtual thus I did not charge for this. He needs an office visit for refills.

## 2020-03-28 NOTE — Telephone Encounter (Signed)
Pt had med refill appt on 03/27/20

## 2020-03-30 DIAGNOSIS — H52203 Unspecified astigmatism, bilateral: Secondary | ICD-10-CM | POA: Diagnosis not present

## 2020-03-30 DIAGNOSIS — H18603 Keratoconus, unspecified, bilateral: Secondary | ICD-10-CM | POA: Diagnosis not present

## 2020-03-30 DIAGNOSIS — H2513 Age-related nuclear cataract, bilateral: Secondary | ICD-10-CM | POA: Diagnosis not present

## 2020-04-25 ENCOUNTER — Telehealth: Payer: Self-pay | Admitting: *Deleted

## 2020-04-25 NOTE — Telephone Encounter (Signed)
Schedule AWV.  

## 2020-05-07 ENCOUNTER — Other Ambulatory Visit: Payer: Self-pay | Admitting: *Deleted

## 2020-05-07 DIAGNOSIS — Z760 Encounter for issue of repeat prescription: Secondary | ICD-10-CM

## 2020-05-07 MED ORDER — LISINOPRIL 20 MG PO TABS: 20.0000 mg | ORAL_TABLET | Freq: Every day | ORAL | 0 refills | Status: DC

## 2020-05-10 ENCOUNTER — Ambulatory Visit (INDEPENDENT_AMBULATORY_CARE_PROVIDER_SITE_OTHER): Payer: Medicare Other | Admitting: Emergency Medicine

## 2020-05-10 VITALS — BP 126/68 | Ht 68.5 in | Wt 168.0 lb

## 2020-05-10 DIAGNOSIS — Z0000 Encounter for general adult medical examination without abnormal findings: Secondary | ICD-10-CM

## 2020-05-10 NOTE — Patient Instructions (Signed)
Thank you for taking time to come for your Medicare Wellness Visit. I appreciate your ongoing commitment to your health goals. Please review the following plan we discussed and let me know if I can assist you in the future.  Leroy Kennedy LPN  Preventive Care 75 Years and Older, Male Preventive care refers to lifestyle choices and visits with your health care provider that can promote health and wellness. This includes:  A yearly physical exam. This is also called an annual well check.  Regular dental and eye exams.  Immunizations.  Screening for certain conditions.  Healthy lifestyle choices, such as diet and exercise. What can I expect for my preventive care visit? Physical exam Your health care provider will check:  Height and weight. These may be used to calculate body mass index (BMI), which is a measurement that tells if you are at a healthy weight.  Heart rate and blood pressure.  Your skin for abnormal spots. Counseling Your health care provider may ask you questions about:  Alcohol, tobacco, and drug use.  Emotional well-being.  Home and relationship well-being.  Sexual activity.  Eating habits.  History of falls.  Memory and ability to understand (cognition).  Work and work Statistician. What immunizations do I need?  Influenza (flu) vaccine  This is recommended every year. Tetanus, diphtheria, and pertussis (Tdap) vaccine  You may need a Td booster every 10 years. Varicella (chickenpox) vaccine  You may need this vaccine if you have not already been vaccinated. Zoster (shingles) vaccine  You may need this after age 66. Pneumococcal conjugate (PCV13) vaccine  One dose is recommended after age 84. Pneumococcal polysaccharide (PPSV23) vaccine  One dose is recommended after age 88. Measles, mumps, and rubella (MMR) vaccine  You may need at least one dose of MMR if you were born in 1957 or later. You may also need a second dose. Meningococcal  conjugate (MenACWY) vaccine  You may need this if you have certain conditions. Hepatitis A vaccine  You may need this if you have certain conditions or if you travel or work in places where you may be exposed to hepatitis A. Hepatitis B vaccine  You may need this if you have certain conditions or if you travel or work in places where you may be exposed to hepatitis B. Haemophilus influenzae type b (Hib) vaccine  You may need this if you have certain conditions. You may receive vaccines as individual doses or as more than one vaccine together in one shot (combination vaccines). Talk with your health care provider about the risks and benefits of combination vaccines. What tests do I need? Blood tests  Lipid and cholesterol levels. These may be checked every 5 years, or more frequently depending on your overall health.  Hepatitis C test.  Hepatitis B test. Screening  Lung cancer screening. You may have this screening every year starting at age 24 if you have a 30-pack-year history of smoking and currently smoke or have quit within the past 15 years.  Colorectal cancer screening. All adults should have this screening starting at age 52 and continuing until age 61. Your health care provider may recommend screening at age 57 if you are at increased risk. You will have tests every 1-10 years, depending on your results and the type of screening test.  Prostate cancer screening. Recommendations will vary depending on your family history and other risks.  Diabetes screening. This is done by checking your blood sugar (glucose) after you have not eaten for  a while (fasting). You may have this done every 1-3 years.  Abdominal aortic aneurysm (AAA) screening. You may need this if you are a current or former smoker.  Sexually transmitted disease (STD) testing. Follow these instructions at home: Eating and drinking  Eat a diet that includes fresh fruits and vegetables, whole grains, lean  protein, and low-fat dairy products. Limit your intake of foods with high amounts of sugar, saturated fats, and salt.  Take vitamin and mineral supplements as recommended by your health care provider.  Do not drink alcohol if your health care provider tells you not to drink.  If you drink alcohol: ? Limit how much you have to 0-2 drinks a day. ? Be aware of how much alcohol is in your drink. In the [place] one drink equals one 12 oz bottle of beer (355 mL), one 5 oz glass of wine (148 mL), or one 1 oz glass of hard liquor (44 mL). Lifestyle  Take daily care of your teeth and gums.  Stay active. Exercise for at least 30 minutes on 5 or more days each week.  Do not use any products that contain nicotine or tobacco, such as cigarettes, e-cigarettes, and chewing tobacco. If you need help quitting, ask your health care provider.  If you are sexually active, practice safe sex. Use a condom or other form of protection to prevent STIs (sexually transmitted infections).  Talk with your health care provider about taking a low-dose aspirin or statin. What's next?  Visit your health care provider once a year for a well check visit.  Ask your health care provider how often you should have your eyes and teeth checked.  Stay up to date on all vaccines. This information is not intended to replace advice given to you by your health care provider. Make sure you discuss any questions you have with your health care provider. Document Revised: 10/28/2018 Document Reviewed: 10/28/2018 Elsevier Patient Education  2020 [employer]  

## 2020-05-10 NOTE — Progress Notes (Signed)
Presents today for [employer] Visit   Date of last exam: 03/27/2020  Interpreter used for this visit?  No  I connected with  [name] on 05/10/20 by a telephone and verified that I am speaking with the correct person using two identifiers.   I discussed the limitations of evaluation and management by telemedicine. The patient expressed understanding and agreed to proceed.    Patient Care Team: Forrest Moron, MD as PCP - General (Internal Medicine) Jerline Pain, MD as PCP - Cardiology (Cardiology)   Other items to address today:  Discussed  Eye/Dental Discussed immunizations    Other Screening: Last screening for diabetes: 07-07-2020 Last lipid screening: 02-21-2019    Immunization status:  Immunization History  Administered Date(s) Administered  . PFIZER SARS-COV-2 Vaccination 01/15/2020, 02/08/2020     Health Maintenance Due  Topic Date Due  . TETANUS/TDAP  Never done  . PNA vac Low Risk Adult (1 of 2 - PCV13) Never done     Functional Status Survey: Is the patient deaf or have difficulty hearing?: No Does the patient have difficulty seeing, even when wearing glasses/contacts?: No Does the patient have difficulty concentrating, remembering, or making decisions?: No Does the patient have difficulty walking or climbing stairs?: No Does the patient have difficulty dressing or bathing?: No Does the patient have difficulty doing errands alone such as visiting a doctor's office or shopping?: No   6CIT Screen 05/10/2020 02/17/2019  What Year? 0 points 0 points  What month? 0 points 0 points  What time? 0 points 0 points  Count back from 20 0 points 0 points  Months in reverse 0 points 0 points  Repeat phrase 2 points 0 points  Total Score 2 0        Clinical Support from 05/10/2020 in [place] at [facility]  AUDIT-C Score 6       Home Environment:   No trouble climbing stairs Live in 2 story home  Yes scattered  rugs (  Grabbers) No grab bars Adequate lighting/ no clutter   Patient Active Problem List   Diagnosis Date Noted  . Hematoma 12/22/2019  . Elbow pain 12/22/2019  . 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 Pulmonary nodule   . Hyperlipidemia 01/17/2016  . Severe aortic stenosis 05/23/2014  . HTN (hypertension) 02/12/2012     Past Medical History:  Diagnosis Date  . Abnormal large bowel motility   . Arthritis   . Hearing loss   . Heart murmur   . Hyperlipidemia   . Hypertension   . Melanoma ([facility])    melanoma  . Other fatigue   . Pulmonary nodule    a. noted on pre TAVR CT, will need 1 year follow up given smoking history   . S/P TAVR (transcatheter aortic valve replacement)    a. s/p TAVR with a 26 mm Edwards sapein 3 Ultra valve via the TF approach on 07/12/19  . Severe aortic stenosis    a. s/p TAVR with a 26 mm Edwards sapein 3 Ultra valve via the TF approach on 07/12/19  . Stool incontinence   . Urgency of urination   . Vision loss of right eye      Past Surgical History:  Procedure Laterality Date  . COLONOSCOPY  2017  . EYE SURGERY  1981    Right eye- corneal transplant  . HERNIA REPAIR     x3  . POLYPECTOMY    .  RIGHT/LEFT HEART CATH AND CORONARY ANGIOGRAPHY N/A 06/23/2018   Procedure: RIGHT/LEFT HEART CATH AND CORONARY ANGIOGRAPHY;  Surgeon: Burnell Blanks, MD;  Location: [facility];  Service: Cardiovascular;  Laterality: N/A;  . TEE WITHOUT CARDIOVERSION N/A 07/12/2019   Procedure: TRANSESOPHAGEAL ECHOCARDIOGRAM (TEE);  Surgeon: Burnell Blanks, MD;  Location: [facility];  Service: Open Heart Surgery;  Laterality: N/A;  . TONSILLECTOMY    . TRANSCATHETER AORTIC VALVE REPLACEMENT, TRANSFEMORAL N/A 07/12/2019   Procedure: TRANSCATHETER AORTIC VALVE REPLACEMENT, TRANSFEMORAL;  Surgeon: Burnell Blanks, MD;  Location: [facility];  Service: Open Heart Surgery;  Laterality: N/A;     Family History  Problem Relation  Age of Onset  . Heart disease Father        MI  . Hyperlipidemia Father   . Hypertension Mother   . Alcohol abuse Mother   . Hyperlipidemia Brother   . Hypertension Brother   . Colon cancer Neg Hx   . Colon polyps Neg Hx   . Rectal cancer Neg Hx   . Stomach cancer Neg Hx   . Esophageal cancer Neg Hx      Social History   Socioeconomic History  . Marital status: Divorced    Spouse name: Not on file  . Number of children: 0  . Years of education: Not on file  . Highest education level: Not on file  Occupational History  . Occupation: Retired-Psychologist  Tobacco Use  . Smoking status: Current Some Day Smoker    Types: Cigars  . Smokeless tobacco: Never Used  Vaping Use  . Vaping Use: Never used  Substance and Sexual Activity  . Alcohol use: Yes    Alcohol/week: 14.0 standard drinks    Types: 14 Cans of beer per week    Comment:  occasional wine; about 2 beers/day  . Drug use: Yes    Types: Marijuana    Comment: 1-2 week  . Sexual activity: Never  Other Topics Concern  . Not on file  Social History Narrative  . Not on file   Social Determinants of Health   Financial Resource Strain:   . Difficulty of Paying Living Expenses:   Food Insecurity:   . Worried About Charity fundraiser in the Last Year:   . Arboriculturist in the Last Year:   Transportation Needs:   . Film/video editor (Medical):   Marland Kitchen Lack of Transportation (Non-Medical):   Physical Activity:   . Days of Exercise per Week:   . Minutes of Exercise per Session:   Stress:   . Feeling of Stress :   Social Connections:   . Frequency of Communication with Friends and Family:   . Frequency of Social Gatherings with Friends and Family:   . Attends Religious Services:   . Active Member of Clubs or Organizations:   . Attends Archivist Meetings:   Marland Kitchen Marital Status:   Intimate Partner Violence:   . Fear of Current or Ex-Partner:   . Emotionally Abused:   Marland Kitchen Physically Abused:   .  Sexually Abused:      No Known Allergies   Prior to Admission medications   Medication Sig Start Date End Date Taking? Authorizing Provider  amoxicillin (AMOXIL) 500 MG tablet Take 4 tablets (2,000 mg total) by mouth as directed. Take 4 tablets 1 hour prior to dental work, including cleanings. Patient not taking: Reported on 01/19/2020 07/13/19   Eileen Stanford, PA-C  aspirin 81 MG chewable tablet  Chew 1 tablet (81 mg total) by mouth daily. 07/14/19   Eileen Stanford, PA-C  cetirizine (ZYRTEC) 10 MG tablet Take 10 mg by mouth daily as needed for allergies.    [provider]  doxylamine, Sleep, (UNISOM) 25 MG tablet Take 25 mg by mouth at bedtime.    [provider]  fish oil-omega-3 fatty acids 1000 MG capsule Take 1 g by mouth daily.     [provider]  Glucosamine-Chondroit-Vit C-Mn (GLUCOSAMINE 1500 COMPLEX PO) Take by mouth.    [provider]  Inulin (FIBER CHOICE) 1.5 g CHEW Take as directed Patient not taking: Reported on 01/19/2020 01/12/20   Yetta Flock, MD  lisinopril (ZESTRIL) 20 MG tablet Take 1 tablet (20 mg total) by mouth daily. 05/07/20   Rutherford Guys, MD  nystatin cream (MYCOSTATIN) Apply 1 application topically 2 (two) times daily. Patient not taking: Reported on 01/19/2020 08/08/19   Eileen Stanford, PA-C  oxymetazoline (AFRIN) 0.05 % nasal spray Place 1-2 sprays into both nostrils 2 (two) times daily as needed for congestion.    [provider]  tamsulosin (FLOMAX) 0.4 MG CAPS capsule Take 1 capsule (0.4 mg total) by mouth daily after breakfast. Patient not taking: Reported on 01/19/2020 01/12/20   Yetta Flock, MD     Depression screen [facility] 2/9 05/10/2020 03/27/2020 02/17/2019 03/25/2017 03/18/2017  Decreased Interest 0 0 0 0 0  Down, Depressed, Hopeless 0 0 0 0 0  PHQ - 2 Score 0 0 0 0 0  Altered sleeping - - - - -  Tired, decreased energy - - - - -  Change in appetite - - - - -  Feeling bad or failure  about yourself  - - - - -  Trouble concentrating - - - - -  Moving slowly or fidgety/restless - - - - -  Suicidal thoughts - - - - -  PHQ-9 Score - - - - -     Fall Risk  05/10/2020 03/27/2020 02/17/2019 03/25/2017 03/18/2017  Falls in the past year? 0 1 0 No No  Number falls in past yr: 0 0 - - -  Comment walking down back stairs slid down stairs 5 steps. - - - -  Injury with Fall? 1 0 - - -  Risk for fall due to : - - - - -  Risk for fall due to: Comment - - - - -  Follow up Falls evaluation completed;Education provided Falls evaluation completed - - -      PHYSICAL EXAM: BP 126/68 Comment: not in clinic taken from a previous visit  Ht 5' 8.5" (1.74 m)   Wt 168 lb (76.2 kg)   BMI 25.17 kg/m    Wt Readings from Last 3 Encounters:  05/10/20 168 lb (76.2 kg)  01/19/20 168 lb (76.2 kg)  01/12/20 168 lb (76.2 kg)      Education/Counseling provided regarding diet and exercise, prevention of chronic diseases, smoking/tobacco cessation, if applicable, and reviewed "Covered Medicare Preventive Services."

## 2020-06-22 ENCOUNTER — Other Ambulatory Visit: Payer: Medicare Other

## 2020-06-29 ENCOUNTER — Telehealth: Payer: Self-pay | Admitting: Emergency Medicine

## 2020-06-29 NOTE — Telephone Encounter (Signed)
Called to speak with pt / unable to LVM message box full. Pt provider is not available 07/05/2020 need to reschedule TOC for another time

## 2020-07-03 ENCOUNTER — Telehealth: Payer: Self-pay

## 2020-07-03 NOTE — Telephone Encounter (Signed)
Due to provider illness, the patient will keep echo appointment 8/18 and switch to virtual visit 8/19. The only time he can do is 1030 and he requests to be called exactly at appointment time.   Consent obtained.    Patient Consent for Virtual Visit   By participating in this virtual visit I agree to the following:  I hereby voluntarily request, consent and authorize [facility] and its employed or contracted physicians, physician assistants, nurse practitioners or other licensed health care professionals (the Practitioner), to provide me with telemedicine health care services (the "Services") as deemed necessary by the treating Practitioner. I acknowledge and consent to receive the Services by the Practitioner via telemedicine. I understand that the telemedicine visit will involve communicating with the Practitioner through live audiovisual communication technology and the disclosure of certain medical information by electronic transmission. I acknowledge that I have been given the opportunity to request an in-person assessment or other available alternative prior to the telemedicine visit and am voluntarily participating in the telemedicine visit.  I understand that I have the right to withhold or withdraw my consent to the use of telemedicine in the course of my care at any time, without affecting my right to future care or treatment, and that the Practitioner or I may terminate the telemedicine visit at any time. I understand that I have the right to inspect all information obtained and/or recorded in the course of the telemedicine visit and may receive copies of available information for a reasonable fee.  I understand that some of the potential risks of receiving the Services via telemedicine include:  Marland Kitchen Delay or interruption in medical evaluation due to technological equipment failure or disruption; . Information transmitted may not be sufficient (e.g. poor resolution of images) to allow for  appropriate medical decision making by the Practitioner; and/or  . In rare instances, security protocols could fail, causing a breach of personal health information.  Furthermore, I acknowledge that it is my responsibility to provide information about my medical history, conditions and care that is complete and accurate to the best of my ability. I acknowledge that Practitioner's advice, recommendations, and/or decision may be based on factors not within their control, such as incomplete or inaccurate data provided by me or distortions of diagnostic images or specimens that may result from electronic transmissions. I understand that the practice of medicine is not an exact science and that Practitioner makes no warranties or guarantees regarding treatment outcomes. I acknowledge that a copy of this consent can be made available to me via my patient portal ([facility]), or I can request a printed copy by calling the office of [facility].    I understand that my insurance will be billed for this visit.   I have read or had this consent read to me. . I understand the contents of this consent, which adequately explains the benefits and risks of the Services being provided via telemedicine.  . I have been provided ample opportunity to ask questions regarding this consent and the Services and have had my questions answered to my satisfaction. . I give my informed consent for the services to be provided through the use of telemedicine in my medical care

## 2020-07-04 ENCOUNTER — Other Ambulatory Visit: Payer: Self-pay

## 2020-07-04 ENCOUNTER — Ambulatory Visit: Payer: Medicare Other | Admitting: Physician Assistant

## 2020-07-04 ENCOUNTER — Ambulatory Visit (HOSPITAL_COMMUNITY): Payer: Medicare Other | Attending: Physician Assistant

## 2020-07-04 ENCOUNTER — Inpatient Hospital Stay: Admission: RE | Admit: 2020-07-04 | Payer: Medicare Other | Source: Ambulatory Visit

## 2020-07-04 DIAGNOSIS — Z952 Presence of prosthetic heart valve: Secondary | ICD-10-CM | POA: Diagnosis not present

## 2020-07-04 LAB — ECHOCARDIOGRAM COMPLETE
AR max vel: 1.15 cm2
AV Area VTI: 1.26 cm2
AV Area mean vel: 1.24 cm2
AV Mean grad: 22 mmHg
AV Peak grad: 37.5 mmHg
Ao pk vel: 3.06 m/s
Area-P 1/2: 2.27 cm2
S' Lateral: 2.6 cm

## 2020-07-04 NOTE — Progress Notes (Signed)
[facility]   MULTIDISCIPLINARY HEART VALVE TEAM  Virtual Visit via Telephone Note   This visit type was conducted due to national recommendations for restrictions regarding the COVID-19 Pandemic (e.g. social distancing) in an effort to limit this patient's exposure and mitigate transmission in our community.  Due to his co-morbid illnesses, this patient is at least at moderate risk for complications without adequate follow up.  This format is felt to be most appropriate for this patient at this time.  The patient did not have access to video technology/had technical difficulties with video requiring transitioning to audio format only (telephone).  All issues noted in this document were discussed and addressed.  No physical exam could be performed with this format.  Please refer to the patient's chart for his  consent to telehealth for [facility].   Evaluation Performed:  Follow-up visit  Date:  07/05/2020   ID:  Charles Robles, DOB [birth year], MRN [id number]  Patient Location: Home Provider Location: Office/Clinic  PCP:  Patient, No Pcp Per  Cardiologist:  Candee Furbish, MD/ Dr. Angelena Form & Dr. Cyndia Bent (TAVR)  Chief Complaint:  1 year s/p TAVR  History of Present Illness:    Charles Robles is a 74 y.o. male with a history of HTN, HLD, pulmonary nodule and severe AS s/p TAVR (07/12/19) who presents for follow up.  The patient was seen by Dr. Angelena Form in 05/2018 for evaluation of severe AS with a mean gradient of 38 mm Hg. He was asymptomatic and continued surveillance was recommended.He subsequently underwent cardiac catheterization in8/2019 which showed mild nonobstructive coronary disease. The mean gradient across aortic valve was 15.7 mmHg.He had an exercise tolerance test in8/2019 that showed good exercise tolerance and no EKG changes.It was decided to continue following his aortic stenosis. Follow-up echo in6/2020 showedEF 60-65% with severe LVH  andprogression of his aortic stenosis with a increase in the mean gradient of 45 mmHg.He reporteda several month history of progressive exertional dyspnea and fatigue as well asoccasional episodes of dizziness and chest discomfort. He was evaluated by the multidisciplinary valve team and underwent successful TAVR with a84mm Edwards Sapien 3 UltraTHV via the TF approach on8/25/20. Post op echo showed EF 60-65%, normally functioning TAVR with a mean gradient of 19 mm Hg and no PVL. He was discharged on aspirin and plavix.   Today he presents via virtual medicine for follow up. He denies shortness of breath. Rides his bike all the time. Having fatigue. Going to see PCP today. No CP. No LE edema, orthopnea or PND. No dizziness or syncope. No blood in stool or urine. No palpitations.    The patient does not have symptoms concerning for COVID-19 infection (fever, chills, cough, or new shortness of breath).    Past Medical History:  Diagnosis Date  . Abnormal large bowel motility   . Arthritis   . Hearing loss   . Heart murmur   . Hyperlipidemia   . Hypertension   . Melanoma ([place])    melanoma  . Other fatigue   . Pulmonary nodule    a. noted on pre TAVR CT, will need 1 year follow up given smoking history   . S/P TAVR (transcatheter aortic valve replacement)    a. s/p TAVR with a 26 mm Edwards sapein 3 Ultra valve via the TF approach on 07/12/19  . Severe aortic stenosis    a. s/p TAVR with a 26 mm Edwards sapein 3 Ultra valve via the TF approach on 07/12/19  .  Stool incontinence   . Urgency of urination   . Vision loss of right eye    Past Surgical History:  Procedure Laterality Date  . COLONOSCOPY  2017  . EYE SURGERY  1981    Right eye- corneal transplant  . HERNIA REPAIR     x3  . POLYPECTOMY    . RIGHT/LEFT HEART CATH AND CORONARY ANGIOGRAPHY N/A 06/23/2018   Procedure: RIGHT/LEFT HEART CATH AND CORONARY ANGIOGRAPHY;  Surgeon: Burnell Blanks, MD;  Location: [facility];  Service: Cardiovascular;  Laterality: N/A;  . TEE WITHOUT CARDIOVERSION N/A 07/12/2019   Procedure: TRANSESOPHAGEAL ECHOCARDIOGRAM (TEE);  Surgeon: Burnell Blanks, MD;  Location: [place];  Service: Open Heart Surgery;  Laterality: N/A;  . TONSILLECTOMY    . TRANSCATHETER AORTIC VALVE REPLACEMENT, TRANSFEMORAL N/A 07/12/2019   Procedure: TRANSCATHETER AORTIC VALVE REPLACEMENT, TRANSFEMORAL;  Surgeon: Burnell Blanks, MD;  Location: [facility];  Service: Open Heart Surgery;  Laterality: N/A;     No outpatient medications have been marked as taking for the 07/05/20 encounter (Telemedicine) with Eileen Stanford, PA-C.     Allergies:   Patient has no known allergies.   Social History   Tobacco Use  . Smoking status: Current Some Day Smoker    Types: Cigars  . Smokeless tobacco: Never Used  Vaping Use  . Vaping Use: Never used  Substance Use Topics  . Alcohol use: Yes    Alcohol/week: 14.0 standard drinks    Types: 14 Cans of beer per week    Comment:  occasional wine; about 2 beers/day  . Drug use: Yes    Types: Marijuana    Comment: 1-2 week     Family Hx: The patient's family history includes Alcohol abuse in his mother; Heart disease in his father; Hyperlipidemia in his brother and father; Hypertension in his brother and mother. There is no history of Colon cancer, Colon polyps, Rectal cancer, Stomach cancer, or Esophageal cancer.  ROS:   Please see the history of present illness.    All other systems reviewed and are negative.   Prior CV studies:   The following studies were reviewed today:  TAVR OPERATIVE NOTE   Date of Procedure:07/12/2019  Preoperative Diagnosis:Severe Aortic Stenosis   Postoperative Diagnosis:Same   Procedure:   Transcatheter Aortic Valve Replacement - PercutaneousRightTransfemoral Approach Edwards Sapien 3 Ultra THV (size 40mm, model # 9750TFX, serial #  [id number])  Co-Surgeons:Bryan Alveria Apley, MD and Lauree Chandler, MD   Anesthesiologist:Kevin Smith Robert, MD  Dala Dock, MD  Pre-operative Echo Findings: ? Severe aortic stenosis ? Normalleft ventricular systolic function  Post-operative Echo Findings: ? Noparavalvular leak ? Normalleft ventricular systolic function  _____________  Echo 07/13/19: IMPRESSIONS 1. - TAVR: A 26 mm Sapien 3 TAVR valve is present in the aortic position. The V max is 2.1 cm/s, peak/mean gradients are 16/7 mmHG. AVA not reliable due to aliasing across LVOT due to basal septal hypertrophy and resultant large LVOT VTI. There is no  paravalvular leak/regurgitation detected. This is a normally functioning prosthetic valve. 2. The left ventricle has normal systolic function with an ejection fraction of 60-65%. The cavity size was small. Asymmetric basal hypertrophy present. Left ventricular diastolic Doppler parameters are consistent with impaired relaxation. No evidence  of left ventricular regional wall motion abnormalities. 3. The right ventricle has normal systolic function. The cavity was normal. There is no increase in right ventricular wall thickness. Right ventricular systolic pressure is normal with an estimated pressure of 31.4  mmHg. 4. The mitral valve is grossly normal. There is mild mitral annular calcification present. No evidence of mitral valve stenosis. 5. A 26 Edwards Sapien bioprosthetic aortic valve (TAVR) valve is present in the aortic position. Procedure Date: 07/12/2019 Normal aortic valve prosthesis. 6. When compared to the prior study: TAVR valve now present. 7. The aorta is normal unless otherwise noted. 8. The aortic root is normal in size and structure.  _______________  Echo 08/03/19 IMPRESSIONS 1. The left ventricle has normal systolic function with an ejection fraction of  60-65%. The cavity size was normal. Left ventricular diastolic Doppler parameters are consistent with impaired relaxation. No evidence of left ventricular regional wall  motion abnormalities. 2. The right ventricle has normal systolic function. The cavity was mildly enlarged. There is No increase in right ventricular wall thickness. 3. Left atrial size was normal. 4. Right atrial size was normal. 5. No evidence of mitral valve stenosis. 6. Peak TAVR velocity 2.16m/s, mean 14mmHg, AVA 2.7cm2. 7. Pulmonic valve regurgitation is not visualized by color flow Doppler. 8. The aorta is normal unless otherwise noted.  FINDINGS Left Ventricle: The left ventricle has normal systolic function, with an ejection fraction of 60-65%. The cavity size was normal. There is no increase in left ventricular wall thickness. Left ventricular diastolic Doppler parameters are consistent with  impaired relaxation. No evidence of left ventricular regional wall motion abnormalities..   Aortic Valve: The aortic valve has been repaired/replaced Aortic valve regurgitation was not visualized by color flow Doppler. Peak TAVR velocity 2.42m/s, mean 33mmHg, AVA 2.7cm2.   __________________   Echo 07/04/20 IMPRESSIONS  1. Left ventricular ejection fraction, by estimation, is 60 to 65%. The left ventricle has normal function. The left ventricle has no regional wall motion abnormalities. There is mild left ventricular hypertrophy. Left ventricular diastolic parameters  are consistent with Grade I diastolic dysfunction (impaired relaxation).  2. Right ventricular systolic function is normal. The right ventricular size is normal. There is normal pulmonary artery systolic pressure.  3. The mitral valve is normal in structure. No evidence of mitral valve regurgitation. No evidence of mitral stenosis.  4. The aortic valve has been repaired/replaced. Aortic valve regurgitation is not visualized. Mild aortic valve  stenosis. There is a 26 mm Edwards Sapien prosthetic (TAVR) valve present in the aortic position. Procedure Date: 07/12/2019. Aortic valve  area, by VTI measures 1.26 cm. Aortic valve mean gradient measures 22.0 mmHg. Aortic valve Vmax measures 3.06 m/s.  5. The inferior vena cava is normal in size with greater than 50% respiratory variability, suggesting right atrial pressure of 3 mmHg    Labs/Other Tests and Data Reviewed:    EKG:  No ECG reviewed.  Recent Labs: 07/08/2019: ALT 21; B Natriuretic Peptide 78.7 07/13/2019: BUN 13; Creatinine, Ser 1.08; Hemoglobin 11.9; Magnesium 2.1; Platelets 130; Potassium 4.0; Sodium 133   Recent Lipid Panel Lab Results  Component Value Date/Time   CHOL 275 (H) 02/21/2019 11:40 AM   TRIG 125 02/21/2019 11:40 AM   HDL 89 02/21/2019 11:40 AM   CHOLHDL 3.1 02/21/2019 11:40 AM   CHOLHDL 3.4 01/16/2016 12:07 PM   LDLCALC 161 (H) 02/21/2019 11:40 AM    Wt Readings from Last 3 Encounters:  05/10/20 168 lb (76.2 kg)  01/19/20 168 lb (76.2 kg)  01/12/20 168 lb (76.2 kg)     Objective:    Vital Signs:  There were no vitals taken for this visit.    ASSESSMENT & PLAN:    Severe AS s/p  TAVR: echo shows EF 65%, normally functioning TAVR with a mean gradient of 22 mm hg (previously 19) and no PVL. He has NYHA class II symptoms, mostly of fatigue. Will be seen by PCP today to evaluate. He has amoxicillin for SBE prophylaxis. Continue on aspirin indefinitely. Plan to continue regular follow up with Dr. Marlou Porch.   Pulmonary nodule: 2 mm right lower lobe pulmonary nodule noted on pre TAVR CT. Highly non specific and statistically benign but repeat non contrast CT recommended at 12 months if patient is high risk. Set up on 8/20   COVID-19 Education: The signs and symptoms of COVID-19 were discussed with the patient and how to seek care for testing (follow up with PCP or arrange E-visit).  The importance of social distancing was discussed today.  Time:    Today, I have spent 8 minutes with the patient with telehealth technology discussing the above problems.     Medication Adjustments/Labs and Tests Ordered: Current medicines are reviewed at length with the patient today.  Concerns regarding medicines are outlined above.   Tests Ordered: No orders of the defined types were placed in this encounter.   Medication Changes: No orders of the defined types were placed in this encounter.    Disposition:  Follow up prn  Signed, Angelena Form, PA-C  07/05/2020 10:48 AM    [place]

## 2020-07-05 ENCOUNTER — Telehealth (INDEPENDENT_AMBULATORY_CARE_PROVIDER_SITE_OTHER): Payer: Medicare Other | Admitting: Physician Assistant

## 2020-07-05 ENCOUNTER — Encounter: Payer: Medicare Other | Admitting: Emergency Medicine

## 2020-07-05 DIAGNOSIS — Z952 Presence of prosthetic heart valve: Secondary | ICD-10-CM | POA: Diagnosis not present

## 2020-07-05 DIAGNOSIS — R911 Solitary pulmonary nodule: Secondary | ICD-10-CM | POA: Diagnosis not present

## 2020-07-05 DIAGNOSIS — I10 Essential (primary) hypertension: Secondary | ICD-10-CM

## 2020-07-06 ENCOUNTER — Ambulatory Visit
Admission: RE | Admit: 2020-07-06 | Discharge: 2020-07-06 | Disposition: A | Discharge status: Home / Self Care | Payer: Medicare Other | Source: Ambulatory Visit | Attending: Physician Assistant | Admitting: Physician Assistant

## 2020-07-06 ENCOUNTER — Other Ambulatory Visit: Payer: Self-pay

## 2020-07-06 ENCOUNTER — Encounter: Payer: Self-pay | Admitting: Family Medicine

## 2020-07-06 ENCOUNTER — Ambulatory Visit (INDEPENDENT_AMBULATORY_CARE_PROVIDER_SITE_OTHER): Payer: Medicare Other | Admitting: Family Medicine

## 2020-07-06 VITALS — BP 132/81 | HR 74 | Temp 98.2°F | Ht 68.5 in | Wt 171.6 lb

## 2020-07-06 DIAGNOSIS — Z952 Presence of prosthetic heart valve: Secondary | ICD-10-CM | POA: Diagnosis not present

## 2020-07-06 DIAGNOSIS — J984 Other disorders of lung: Secondary | ICD-10-CM | POA: Diagnosis not present

## 2020-07-06 DIAGNOSIS — R911 Solitary pulmonary nodule: Secondary | ICD-10-CM | POA: Diagnosis not present

## 2020-07-06 DIAGNOSIS — I700 Atherosclerosis of aorta: Secondary | ICD-10-CM | POA: Diagnosis not present

## 2020-07-06 DIAGNOSIS — R5383 Other fatigue: Secondary | ICD-10-CM

## 2020-07-06 DIAGNOSIS — I2510 Atherosclerotic heart disease of native coronary artery without angina pectoris: Secondary | ICD-10-CM | POA: Diagnosis not present

## 2020-07-06 DIAGNOSIS — I10 Essential (primary) hypertension: Secondary | ICD-10-CM

## 2020-07-06 DIAGNOSIS — J8410 Pulmonary fibrosis, unspecified: Secondary | ICD-10-CM | POA: Diagnosis not present

## 2020-07-06 DIAGNOSIS — D649 Anemia, unspecified: Secondary | ICD-10-CM | POA: Diagnosis not present

## 2020-07-06 MED ORDER — LISINOPRIL 20 MG PO TABS: 20.0000 mg | ORAL_TABLET | Freq: Every day | ORAL | 1 refills | Status: DC

## 2020-07-06 NOTE — Patient Instructions (Signed)
° ° ° °  If you have lab work done today you will be contacted with your lab results within the next 2 weeks.  If you have not heard from us then please contact us. The fastest way to get your results is to register for My Chart. ° ° °IF you received an x-ray today, you will receive an invoice from [facility]. Please contact [facility] at [phone] with questions or concerns regarding your invoice.  ° °IF you received labwork today, you will receive an invoice from [facility]. Please contact [facility] at [phone] with questions or concerns regarding your invoice.  ° °Our billing staff will not be able to assist you with questions regarding bills from these companies. ° °You will be contacted with the lab results as soon as they are available. The fastest way to get your results is to activate your My Chart account. Instructions are located on the last page of this paperwork. If you have not heard from us regarding the results in 2 weeks, please contact this office. °  ° ° ° °

## 2020-07-06 NOTE — Progress Notes (Signed)
8/20/20219:24 AM  Charles Robles [birth year], 74 y.o., male [id number]  Chief Complaint  Patient presents with  . Follow-up    pt dd not want to share the reason he is here today during triage. Only info given was what med to take off chart. Note says testing of a valve    HPI:   Patient is a 74 y.o. male with past medical history significant for HTN, HLP, pulm nodule, severe AS s/p TAVR, colonic polyps who presents today for fatigue  Patient saw heart valve clinic (telemedicine yesterday) - released to routine cards care Previous PCP Dr Nolon Rod GI Dr Havery Moros Cards Dr Marlou Porch  He reports that nurse at ct surg thought he might need a thryoid test due to fatigue   Wants to do things but has no energy Gets tired easily with minimal activity  No changes in hair, skin, weight, temp tolerance, no sign changes in BM No persistent sleep issues Does not think he snores Does not wake up with headaches Does not always wake up refreshed Does not fall asleep easily No dietary restrictions in diet Last colonoscopy in march 2021  No chest pain, dizziness, presyncope, cough, very rarely DOE, no wheezing, no melena or bright red blood per stool, no night sweats, no fever or chills Not as happy as could be due to pandemic Scheduled for CT chest later today Smokes cigars occ, no cig smoker Non fasting  Lab Results  Component Value Date   WBC 7.8 07/13/2019   HGB 11.9 (L) 07/13/2019   HCT 35.6 (L) 07/13/2019   MCV 96.7 07/13/2019   PLT 130 (L) 07/13/2019   Lab Results  Component Value Date   CREATININE 1.08 07/13/2019   BUN 13 07/13/2019   NA 133 (L) 07/13/2019   K 4.0 07/13/2019   CL 104 07/13/2019   CO2 21 (L) 07/13/2019   Lab Results  Component Value Date   CHOL 275 (H) 02/21/2019   HDL 89 02/21/2019   LDLCALC 161 (H) 02/21/2019   TRIG 125 02/21/2019   CHOLHDL 3.1 02/21/2019   Lab Results  Component Value Date   ALT 21 07/08/2019   AST 23 07/08/2019    ALKPHOS 52 07/08/2019   BILITOT 0.5 07/08/2019    Depression screen [facility] 2/9 05/10/2020 03/27/2020 02/17/2019  Decreased Interest 0 0 0  Down, Depressed, Hopeless 0 0 0  PHQ - 2 Score 0 0 0  Altered sleeping - - -  Tired, decreased energy - - -  Change in appetite - - -  Feeling bad or failure about yourself  - - -  Trouble concentrating - - -  Moving slowly or fidgety/restless - - -  Suicidal thoughts - - -  PHQ-9 Score - - -    Fall Risk  05/10/2020 03/27/2020 02/17/2019 03/25/2017 03/18/2017  Falls in the past year? 0 1 0 No No  Number falls in past yr: 0 0 - - -  Comment walking down back stairs slid down stairs 5 steps. - - - -  Injury with Fall? 1 0 - - -  Risk for fall due to : - - - - -  Risk for fall due to: Comment - - - - -  Follow up Falls evaluation completed;Education provided Falls evaluation completed - - -     No Known Allergies  Prior to Admission medications   Medication Sig Start Date End Date Taking? Authorizing Provider  amoxicillin (AMOXIL) 500 MG tablet Take 4 tablets (2,000  mg total) by mouth as directed. Take 4 tablets 1 hour prior to dental work, including cleanings. 07/13/19  Yes Eileen Stanford, PA-C  aspirin 81 MG chewable tablet Chew 1 tablet (81 mg total) by mouth daily. 07/14/19  Yes Eileen Stanford, PA-C  cetirizine (ZYRTEC) 10 MG tablet Take 10 mg by mouth daily as needed for allergies.   Yes [provider]  doxylamine, Sleep, (UNISOM) 25 MG tablet Take 25 mg by mouth at bedtime.   Yes [provider]  fish oil-omega-3 fatty acids 1000 MG capsule Take 1 g by mouth daily.    Yes [provider]  Glucosamine-Chondroit-Vit C-Mn (GLUCOSAMINE 1500 COMPLEX PO) Take by mouth.   Yes [provider]  lisinopril (ZESTRIL) 20 MG tablet Take 1 tablet (20 mg total) by mouth daily. 05/07/20  Yes Rutherford Guys, MD  nystatin cream (MYCOSTATIN) Apply 1 application topically 2 (two) times daily. 08/08/19  Yes Eileen Stanford,  PA-C  oxymetazoline (AFRIN) 0.05 % nasal spray Place 1-2 sprays into both nostrils 2 (two) times daily as needed for congestion.   Yes [provider]    Past Medical History:  Diagnosis Date  . Abnormal large bowel motility   . Arthritis   . Hearing loss   . Heart murmur   . Hyperlipidemia   . Hypertension   . Melanoma ([place])    melanoma  . Other fatigue   . Pulmonary nodule    a. noted on pre TAVR CT, will need 1 year follow up given smoking history   . S/P TAVR (transcatheter aortic valve replacement)    a. s/p TAVR with a 26 mm Edwards sapein 3 Ultra valve via the TF approach on 07/12/19  . Severe aortic stenosis    a. s/p TAVR with a 26 mm Edwards sapein 3 Ultra valve via the TF approach on 07/12/19  . Stool incontinence   . Urgency of urination   . Vision loss of right eye     Past Surgical History:  Procedure Laterality Date  . COLONOSCOPY  2017  . EYE SURGERY  1981    Right eye- corneal transplant  . HERNIA REPAIR     x3  . POLYPECTOMY    . RIGHT/LEFT HEART CATH AND CORONARY ANGIOGRAPHY N/A 06/23/2018   Procedure: RIGHT/LEFT HEART CATH AND CORONARY ANGIOGRAPHY;  Surgeon: Burnell Blanks, MD;  Location: [facility];  Service: Cardiovascular;  Laterality: N/A;  . TEE WITHOUT CARDIOVERSION N/A 07/12/2019   Procedure: TRANSESOPHAGEAL ECHOCARDIOGRAM (TEE);  Surgeon: Burnell Blanks, MD;  Location: [facility];  Service: Open Heart Surgery;  Laterality: N/A;  . TONSILLECTOMY    . TRANSCATHETER AORTIC VALVE REPLACEMENT, TRANSFEMORAL N/A 07/12/2019   Procedure: TRANSCATHETER AORTIC VALVE REPLACEMENT, TRANSFEMORAL;  Surgeon: Burnell Blanks, MD;  Location: [facility];  Service: Open Heart Surgery;  Laterality: N/A;    Social History   Tobacco Use  . Smoking status: Current Some Day Smoker    Types: Cigars  . Smokeless tobacco: Never Used  Substance Use Topics  . Alcohol use: Yes    Alcohol/week: 14.0 standard drinks    Types: 14 Cans of beer  per week    Comment:  occasional wine; about 2 beers/day    Family History  Problem Relation Age of Onset  . Heart disease Father        MI  . Hyperlipidemia Father   . Hypertension Mother   . Alcohol abuse Mother   . Hyperlipidemia  Brother   . Hypertension Brother   . Colon cancer Neg Hx   . Colon polyps Neg Hx   . Rectal cancer Neg Hx   . Stomach cancer Neg Hx   . Esophageal cancer Neg Hx     ROS Per hpi  OBJECTIVE:  Today's Vitals   07/06/20 0912  BP: 132/81  Pulse: 74  Temp: 98.2 F (36.8 C)  SpO2: 98%  Weight: 171 lb 9.6 oz (77.8 kg)  Height: 5' 8.5" (1.74 m)   Body mass index is 25.71 kg/m.  Wt Readings from Last 3 Encounters:  07/06/20 171 lb 9.6 oz (77.8 kg)  05/10/20 168 lb (76.2 kg)  01/19/20 168 lb (76.2 kg)    Physical Exam Vitals and nursing note reviewed.  Constitutional:      Appearance: He is well-developed.  HENT:     Head: Normocephalic and atraumatic.     Right Ear: Hearing, tympanic membrane, ear canal and external ear normal.     Left Ear: Hearing, tympanic membrane, ear canal and external ear normal.     Mouth/Throat:     Pharynx: No oropharyngeal exudate.  Eyes:     Extraocular Movements: Extraocular movements intact.     Conjunctiva/sclera: Conjunctivae normal.     Pupils: Pupils are equal, round, and reactive to light.  Neck:     Thyroid: No thyromegaly.  Cardiovascular:     Rate and Rhythm: Normal rate and regular rhythm.     Heart sounds: Normal heart sounds. No murmur heard.  No friction rub. No gallop.   Pulmonary:     Effort: Pulmonary effort is normal.     Breath sounds: Normal breath sounds. No wheezing, rhonchi or rales.  Abdominal:     General: Bowel sounds are normal. There is no distension.     Palpations: Abdomen is soft. There is no mass.     Tenderness: There is no abdominal tenderness.  Musculoskeletal:        General: Normal range of motion.     Cervical back: Neck supple.  Lymphadenopathy:      Cervical: No cervical adenopathy.  Skin:    General: Skin is warm and dry.  Neurological:     Mental Status: He is alert and oriented to person, place, and time.     Cranial Nerves: No cranial nerve deficit.     Coordination: Coordination normal.     Gait: Gait normal.     Deep Tendon Reflexes: Reflexes are normal and symmetric.  Psychiatric:        Mood and Affect: Mood normal.        Behavior: Behavior normal.     No results found for this or any previous visit (from the past 24 hour(s)).  No results found.   ASSESSMENT and PLAN  1. Fatigue, unspecified type History and exam non focal. Labs pending. Discussed slowly increasing endurance. pulm nodule eval later today as ordered by heart valve clinic - Comprehensive metabolic panel - TSH  2. Anemia, unspecified type Finding prior to TAVR, ? AS? Repeating labs - CBC - Iron, TIBC and Ferritin Panel  3. Essential hypertension Controlled. Continue current regime.  - lisinopril (ZESTRIL) 20 MG tablet; Take 1 tablet (20 mg total) by mouth daily.  4. S/P TAVR (transcatheter aortic valve replacement) Doing well. Managed by cards, recent eval with heart valve clinic  5. Pulmonary nodule Repeat ct today.   Return for as scheduled.    Rutherford Guys, MD Primary Care at [facility]  [address] [place], [place] [address] Ph.  [phone] Fax [phone]

## 2020-07-07 LAB — IRON,TIBC AND FERRITIN PANEL
Ferritin: 137 ng/mL (ref 30–400)
Iron Saturation: 41 % (ref 15–55)
Iron: 113 ug/dL (ref 38–169)
Total Iron Binding Capacity: 274 ug/dL (ref 250–450)
UIBC: 161 ug/dL (ref 111–343)

## 2020-07-07 LAB — COMPREHENSIVE METABOLIC PANEL
ALT: 17 IU/L (ref 0–44)
AST: 18 IU/L (ref 0–40)
Albumin/Globulin Ratio: 1.6 (ref 1.2–2.2)
Albumin: 4.2 g/dL (ref 3.7–4.7)
Alkaline Phosphatase: 58 IU/L (ref 48–121)
BUN/Creatinine Ratio: 20 (ref 10–24)
BUN: 23 mg/dL (ref 8–27)
Bilirubin Total: 0.3 mg/dL (ref 0.0–1.2)
CO2: 21 mmol/L (ref 20–29)
Calcium: 9.8 mg/dL (ref 8.6–10.2)
Chloride: 102 mmol/L (ref 96–106)
Creatinine, Ser: 1.15 mg/dL (ref 0.76–1.27)
GFR calc Af Amer: 72 mL/min/{1.73_m2} (ref >59)
GFR calc non Af Amer: 62 mL/min/{1.73_m2} (ref >59)
Globulin, Total: 2.7 g/dL (ref 1.5–4.5)
Glucose: 93 mg/dL (ref 65–99)
Potassium: 4.8 mmol/L (ref 3.5–5.2)
Sodium: 136 mmol/L (ref 134–144)
Total Protein: 6.9 g/dL (ref 6.0–8.5)

## 2020-07-07 LAB — CBC
Hematocrit: 40.4 % (ref 37.5–51.0)
Hemoglobin: 13.9 g/dL (ref 13.0–17.7)
MCH: 32.3 pg (ref 26.6–33.0)
MCHC: 34.4 g/dL (ref 31.5–35.7)
MCV: 94 fL (ref 79–97)
Platelets: 152 10*3/uL (ref 150–450)
RBC: 4.31 x10E6/uL (ref 4.14–5.80)
RDW: 11.9 % (ref 11.6–15.4)
WBC: 6.9 10*3/uL (ref 3.4–10.8)

## 2020-07-07 LAB — TSH: TSH: 0.829 u[IU]/mL (ref 0.450–4.500)

## 2020-09-12 ENCOUNTER — Ambulatory Visit (INDEPENDENT_AMBULATORY_CARE_PROVIDER_SITE_OTHER): Payer: Medicare Other | Admitting: Emergency Medicine

## 2020-09-12 ENCOUNTER — Encounter: Payer: Self-pay | Admitting: Emergency Medicine

## 2020-09-12 ENCOUNTER — Other Ambulatory Visit: Payer: Self-pay

## 2020-09-12 VITALS — BP 170/80 | HR 72 | Temp 98.1°F | Resp 16 | Ht 69.0 in | Wt 173.0 lb

## 2020-09-12 DIAGNOSIS — I10 Essential (primary) hypertension: Secondary | ICD-10-CM | POA: Diagnosis not present

## 2020-09-12 DIAGNOSIS — R399 Unspecified symptoms and signs involving the genitourinary system: Secondary | ICD-10-CM

## 2020-09-12 DIAGNOSIS — Z952 Presence of prosthetic heart valve: Secondary | ICD-10-CM

## 2020-09-12 DIAGNOSIS — Z23 Encounter for immunization: Secondary | ICD-10-CM | POA: Diagnosis not present

## 2020-09-12 DIAGNOSIS — Z7689 Persons encountering health services in other specified circumstances: Secondary | ICD-10-CM

## 2020-09-12 DIAGNOSIS — E785 Hyperlipidemia, unspecified: Secondary | ICD-10-CM

## 2020-09-12 DIAGNOSIS — H6123 Impacted cerumen, bilateral: Secondary | ICD-10-CM

## 2020-09-12 NOTE — Patient Instructions (Addendum)
Increase lisinopril to 40 mg daily.  Health Maintenance After Age 75 After age 75, you are at a higher risk for certain long-term diseases and infections as well as injuries from falls. Falls are a major cause of broken bones and head injuries in people who are older than age 75. Getting regular preventive care can help to keep you healthy and well. Preventive care includes getting regular testing and making lifestyle changes as recommended by your health care provider. Talk with your health care provider about:  Which screenings and tests you should have. A screening is a test that checks for a disease when you have no symptoms.  A diet and exercise plan that is right for you. What should I know about screenings and tests to prevent falls? Screening and testing are the best ways to find a health problem early. Early diagnosis and treatment give you the best chance of managing medical conditions that are common after age 75. Certain conditions and lifestyle choices may make you more likely to have a fall. Your health care provider may recommend:  Regular vision checks. Poor vision and conditions such as cataracts can make you more likely to have a fall. If you wear glasses, make sure to get your prescription updated if your vision changes.  Medicine review. Work with your health care provider to regularly review all of the medicines you are taking, including over-the-counter medicines. Ask your health care provider about any side effects that may make you more likely to have a fall. Tell your health care provider if any medicines that you take make you feel dizzy or sleepy.  Osteoporosis screening. Osteoporosis is a condition that causes the bones to get weaker. This can make the bones weak and cause them to break more easily.  Blood pressure screening. Blood pressure changes and medicines to control blood pressure can make you feel dizzy.  Strength and balance checks. Your health care provider  may recommend certain tests to check your strength and balance while standing, walking, or changing positions.  Foot health exam. Foot pain and numbness, as well as not wearing proper footwear, can make you more likely to have a fall.  Depression screening. You may be more likely to have a fall if you have a fear of falling, feel emotionally low, or feel unable to do activities that you used to do.  Alcohol use screening. Using too much alcohol can affect your balance and may make you more likely to have a fall. What actions can I take to lower my risk of falls? General instructions  Talk with your health care provider about your risks for falling. Tell your health care provider if: ? You fall. Be sure to tell your health care provider about all falls, even ones that seem minor. ? You feel dizzy, sleepy, or off-balance.  Take over-the-counter and prescription medicines only as told by your health care provider. These include any supplements.  Eat a healthy diet and maintain a healthy weight. A healthy diet includes low-fat dairy products, low-fat (lean) meats, and fiber from whole grains, beans, and lots of fruits and vegetables. Home safety  Remove any tripping hazards, such as rugs, cords, and clutter.  Install safety equipment such as grab bars in bathrooms and safety rails on stairs.  Keep rooms and walkways well-lit. Activity   Follow a regular exercise program to stay fit. This will help you maintain your balance. Ask your health care provider what types of exercise are appropriate for you.  If you need a cane or walker, use it as recommended by your health care provider.  Wear supportive shoes that have nonskid soles. Lifestyle  Do not drink alcohol if your health care provider tells you not to drink.  If you drink alcohol, limit how much you have: ? 0-1 drink a day for women. ? 0-2 drinks a day for men.  Be aware of how much alcohol is in your drink. In the [place] one  drink equals one typical bottle of beer (12 oz), one-half glass of wine (5 oz), or one shot of hard liquor (1 oz).  Do not use any products that contain nicotine or tobacco, such as cigarettes and e-cigarettes. If you need help quitting, ask your health care provider. Summary  Having a healthy lifestyle and getting preventive care can help to protect your health and wellness after age 75.  Screening and testing are the best way to find a health problem early and help you avoid having a fall. Early diagnosis and treatment give you the best chance for managing medical conditions that are more common for people who are older than age 75.  Falls are a major cause of broken bones and head injuries in people who are older than age 75. Take precautions to prevent a fall at home.  Work with your health care provider to learn what changes you can make to improve your health and wellness and to prevent falls. This information is not intended to replace advice given to you by your health care provider. Make sure you discuss any questions you have with your health care provider. Document Revised: 02/24/2019 Document Reviewed: 09/16/2017 Elsevier Patient Education  [place].  Hypertension, Adult High blood pressure (hypertension) is when the force of blood pumping through the arteries is too strong. The arteries are the blood vessels that carry blood from the heart throughout the body. Hypertension forces the heart to work harder to pump blood and may cause arteries to become narrow or stiff. Untreated or uncontrolled hypertension can cause a heart attack, heart failure, a stroke, kidney disease, and other problems. A blood pressure reading consists of a higher number over a lower number. Ideally, your blood pressure should be below 120/80. The first ("top") number is called the systolic pressure. It is a measure of the pressure in your arteries as your heart beats. The second ("bottom") number is  called the diastolic pressure. It is a measure of the pressure in your arteries as the heart relaxes. What are the causes? The exact cause of this condition is not known. There are some conditions that result in or are related to high blood pressure. What increases the risk? Some risk factors for high blood pressure are under your control. The following factors may make you more likely to develop this condition:  Smoking.  Having type 2 diabetes mellitus, high cholesterol, or both.  Not getting enough exercise or physical activity.  Being overweight.  Having too much fat, sugar, calories, or salt (sodium) in your diet.  Drinking too much alcohol. Some risk factors for high blood pressure may be difficult or impossible to change. Some of these factors include:  Having chronic kidney disease.  Having a family history of high blood pressure.  Age. Risk increases with age.  Race. You may be at higher risk if you are African American.  Gender. Men are at higher risk than women before age 12. After age 71, women are at higher risk than men.  Having obstructive sleep apnea.  Stress. What are the signs or symptoms? High blood pressure may not cause symptoms. Very high blood pressure (hypertensive crisis) may cause:  Headache.  Anxiety.  Shortness of breath.  Nosebleed.  Nausea and vomiting.  Vision changes.  Severe chest pain.  Seizures. How is this diagnosed? This condition is diagnosed by measuring your blood pressure while you are seated, with your arm resting on a flat surface, your legs uncrossed, and your feet flat on the floor. The cuff of the blood pressure monitor will be placed directly against the skin of your upper arm at the level of your heart. It should be measured at least twice using the same arm. Certain conditions can cause a difference in blood pressure between your right and left arms. Certain factors can cause blood pressure readings to be lower or  higher than normal for a short period of time:  When your blood pressure is higher when you are in a health care provider's office than when you are at home, this is called white coat hypertension. Most people with this condition do not need medicines.  When your blood pressure is higher at home than when you are in a health care provider's office, this is called masked hypertension. Most people with this condition may need medicines to control blood pressure. If you have a high blood pressure reading during one visit or you have normal blood pressure with other risk factors, you may be asked to:  Return on a different day to have your blood pressure checked again.  Monitor your blood pressure at home for 1 week or longer. If you are diagnosed with hypertension, you may have other blood or imaging tests to help your health care provider understand your overall risk for other conditions. How is this treated? This condition is treated by making healthy lifestyle changes, such as eating healthy foods, exercising more, and reducing your alcohol intake. Your health care provider may prescribe medicine if lifestyle changes are not enough to get your blood pressure under control, and if:  Your systolic blood pressure is above 130.  Your diastolic blood pressure is above 80. Your personal target blood pressure may vary depending on your medical conditions, your age, and other factors. Follow these instructions at home: Eating and drinking   Eat a diet that is high in fiber and potassium, and low in sodium, added sugar, and fat. An example eating plan is called the DASH (Dietary Approaches to Stop Hypertension) diet. To eat this way: ? Eat plenty of fresh fruits and vegetables. Try to fill one half of your plate at each meal with fruits and vegetables. ? Eat whole grains, such as whole-wheat pasta, brown rice, or whole-grain bread. Fill about one fourth of your plate with whole grains. ? Eat or drink  low-fat dairy products, such as skim milk or low-fat yogurt. ? Avoid fatty cuts of meat, processed or cured meats, and poultry with skin. Fill about one fourth of your plate with lean proteins, such as fish, chicken without skin, beans, eggs, or tofu. ? Avoid pre-made and processed foods. These tend to be higher in sodium, added sugar, and fat.  Reduce your daily sodium intake. Most people with hypertension should eat less than 1,500 mg of sodium a day.  Do not drink alcohol if: ? Your health care provider tells you not to drink. ? You are pregnant, may be pregnant, or are planning to become pregnant.  If you drink alcohol: ?  Limit how much you use to:  0-1 drink a day for women.  0-2 drinks a day for men. ? Be aware of how much alcohol is in your drink. In the [place] one drink equals one 12 oz bottle of beer (355 mL), one 5 oz glass of wine (148 mL), or one 1 oz glass of hard liquor (44 mL). Lifestyle   Work with your health care provider to maintain a healthy body weight or to lose weight. Ask what an ideal weight is for you.  Get at least 30 minutes of exercise most days of the week. Activities may include walking, swimming, or biking.  Include exercise to strengthen your muscles (resistance exercise), such as Pilates or lifting weights, as part of your weekly exercise routine. Try to do these types of exercises for 30 minutes at least 3 days a week.  Do not use any products that contain nicotine or tobacco, such as cigarettes, e-cigarettes, and chewing tobacco. If you need help quitting, ask your health care provider.  Monitor your blood pressure at home as told by your health care provider.  Keep all follow-up visits as told by your health care provider. This is important. Medicines  Take over-the-counter and prescription medicines only as told by your health care provider. Follow directions carefully. Blood pressure medicines must be taken as prescribed.  Do not skip doses of  blood pressure medicine. Doing this puts you at risk for problems and can make the medicine less effective.  Ask your health care provider about side effects or reactions to medicines that you should watch for. Contact a health care provider if you:  Think you are having a reaction to a medicine you are taking.  Have headaches that keep coming back (recurring).  Feel dizzy.  Have swelling in your ankles.  Have trouble with your vision. Get help right away if you:  Develop a severe headache or confusion.  Have unusual weakness or numbness.  Feel faint.  Have severe pain in your chest or abdomen.  Vomit repeatedly.  Have trouble breathing. Summary  Hypertension is when the force of blood pumping through your arteries is too strong. If this condition is not controlled, it may put you at risk for serious complications.  Your personal target blood pressure may vary depending on your medical conditions, your age, and other factors. For most people, a normal blood pressure is less than 120/80.  Hypertension is treated with lifestyle changes, medicines, or a combination of both. Lifestyle changes include losing weight, eating a healthy, low-sodium diet, exercising more, and limiting alcohol. This information is not intended to replace advice given to you by your health care provider. Make sure you discuss any questions you have with your health care provider. Document Revised: 07/14/2018 Document Reviewed: 07/14/2018 Elsevier Patient Education  [employer].     If you have lab work done today you will be contacted with your lab results within the next 2 weeks.  If you have not heard from [place] then please contact us. The fastest way to get your results is to register for My Chart.   IF you received an x-ray today, you will receive an invoice from [facility]. Please contact [facility] at [phone] with questions or concerns regarding your invoice.   IF  you received labwork today, you will receive an invoice from [facility] [facility]. Please contact [facility] at [phone] with questions or concerns regarding your invoice.   Our billing staff will not be able to  assist you with questions regarding bills from these companies.  You will be contacted with the lab results as soon as they are available. The fastest way to get your results is to activate your My Chart account. Instructions are located on the Earwax Buildup, Adult The ears produce a substance called earwax that helps keep bacteria out of the ear and protects the skin in the ear canal. Occasionally, earwax can build up in the ear and cause discomfort or hearing loss. What increases the risk? This condition is more likely to develop in people who: Are male. Are elderly. Naturally produce more earwax. Clean their ears often with cotton swabs. Use earplugs often. Use in-ear headphones often. Wear hearing aids. Have narrow ear canals. Have earwax that is overly thick or sticky. Have eczema. Are dehydrated. Have excess hair in the ear canal. What are the signs or symptoms? Symptoms of this condition include: Reduced or muffled hearing. A feeling of fullness in the ear or feeling that the ear is plugged. Fluid coming from the ear. Ear pain. Ear itch. Ringing in the ear. Coughing. An obvious piece of earwax that can be seen inside the ear canal. How is this diagnosed? This condition may be diagnosed based on: Your symptoms. Your medical history. An ear exam. During the exam, your health care provider will look into your ear with an instrument called an otoscope. You may have tests, including a hearing test. How is this treated? This condition may be treated by: Using ear drops to soften the earwax. Having the earwax removed by a health care provider. The health care provider may: Flush the ear with water. Use an instrument that has a loop on the end (curette). Use a suction  device. Surgery to remove the wax buildup. This may be done in severe cases. Follow these instructions at home:  Take over-the-counter and prescription medicines only as told by your health care provider. Do not put any objects, including cotton swabs, into your ear. You can clean the opening of your ear canal with a washcloth or facial tissue. Follow instructions from your health care provider about cleaning your ears. Do not over-clean your ears. Drink enough fluid to keep your urine clear or pale yellow. This will help to thin the earwax. Keep all follow-up visits as told by your health care provider. If earwax builds up in your ears often or if you use hearing aids, consider seeing your health care provider for routine, preventive ear cleanings. Ask your health care provider how often you should schedule your cleanings. If you have hearing aids, clean them according to instructions from the manufacturer and your health care provider. Contact a health care provider if: You have ear pain. You develop a fever. You have blood, pus, or other fluid coming from your ear. You have hearing loss. You have ringing in your ears that does not go away. Your symptoms do not improve with treatment. You feel like the room is spinning (vertigo). Summary Earwax can build up in the ear and cause discomfort or hearing loss. The most common symptoms of this condition include reduced or muffled hearing and a feeling of fullness in the ear or feeling that the ear is plugged. This condition may be diagnosed based on your symptoms, your medical history, and an ear exam. This condition may be treated by using ear drops to soften the earwax or by having the earwax removed by a health care provider. Do not put any objects, including cotton swabs,  into your ear. You can clean the opening of your ear canal with a washcloth or facial tissue. This information is not intended to replace advice given to you by your health  care provider. Make sure you discuss any questions you have with your health care provider. Document Revised: 10/16/2017 Document Reviewed: 01/14/2017 Elsevier Patient Education  [place].  weeks, please contact this office.

## 2020-09-12 NOTE — Progress Notes (Signed)
Charles Robles 74 y.o.   Chief Complaint  Patient presents with   Establish Care    per patient to complete a DNR   Cerumen Impaction    per patient for both    HISTORY OF PRESENT ILLNESS: This is a 74 y.o. male here to establish care with me Requesting a DNR form. Has history of bilateral ear impaction.  Cannot hear from the right as good as the left. History of hypertension with occasional dizziness.  Elevated systolic blood pressure in the office. Recent blood work done within normal limits.  Reviewed with patient. No other complaints or medical concerns today.  HPI   Prior to Admission medications   Medication Sig Start Date End Date Taking? Authorizing Provider  amoxicillin (AMOXIL) 500 MG tablet Take 4 tablets (2,000 mg total) by mouth as directed. Take 4 tablets 1 hour prior to dental work, including cleanings. 07/13/19  Yes Eileen Stanford, PA-C  aspirin 81 MG chewable tablet Chew 1 tablet (81 mg total) by mouth daily. 07/14/19  Yes Eileen Stanford, PA-C  cetirizine (ZYRTEC) 10 MG tablet Take 10 mg by mouth daily as needed for allergies.   Yes [provider]  doxylamine, Sleep, (UNISOM) 25 MG tablet Take 25 mg by mouth at bedtime.   Yes [provider]  fish oil-omega-3 fatty acids 1000 MG capsule Take 1 g by mouth daily.    Yes [provider]  Glucosamine-Chondroit-Vit C-Mn (GLUCOSAMINE 1500 COMPLEX PO) Take by mouth.   Yes [provider]  lisinopril (ZESTRIL) 20 MG tablet Take 1 tablet (20 mg total) by mouth daily. 07/06/20  Yes Rutherford Guys, MD  oxymetazoline (AFRIN) 0.05 % nasal spray Place 1-2 sprays into both nostrils 2 (two) times daily as needed for congestion.   Yes [provider]  nystatin cream (MYCOSTATIN) Apply 1 application topically 2 (two) times daily. 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 noted on pre TAVR CT, will need 1 year follow up given smoking history    S/P TAVR (transcatheter aortic valve replacement)    a. s/p TAVR with a 26 mm Edwards sapein 3 Ultra valve via the TF approach on 07/12/19   Severe aortic stenosis    a. s/p TAVR with a 26 mm Edwards sapein 3 Ultra valve via the TF approach on 07/12/19   Stool incontinence    Urgency of urination    Vision loss of right eye     Past Surgical History:  Procedure Laterality Date   COLONOSCOPY  2017   EYE SURGERY  1981    Right eye- corneal transplant   HERNIA REPAIR     x3   POLYPECTOMY     RIGHT/LEFT HEART CATH AND CORONARY ANGIOGRAPHY N/A 06/23/2018   Procedure: RIGHT/LEFT HEART CATH AND CORONARY ANGIOGRAPHY;  Surgeon: Burnell Blanks, MD;  Location:  [facility];  Service: Cardiovascular;  Laterality: N/A;   TEE WITHOUT CARDIOVERSION N/A 07/12/2019   Procedure: TRANSESOPHAGEAL ECHOCARDIOGRAM (TEE);  Surgeon: Burnell Blanks, MD;  Location: [facility];  Service: Open Heart Surgery;  Laterality: N/A;   TONSILLECTOMY     TRANSCATHETER AORTIC VALVE REPLACEMENT, TRANSFEMORAL N/A 07/12/2019   Procedure: TRANSCATHETER AORTIC VALVE REPLACEMENT, TRANSFEMORAL;  Surgeon: Burnell Blanks, MD;  Location: [facility];  Service: Open Heart Surgery;  Laterality: N/A;    Social History   Socioeconomic History   Marital status: Divorced    Spouse name: Not on file   Number of children: 0   Years of education: Not on file   Highest education level:  Not on file  Occupational History   Occupation: Retired-Psychologist  Tobacco Use   Smoking status: Current Some Day Smoker    Types: Cigars   Smokeless tobacco: Never Used  Scientific laboratory technician Use: Never used  Substance and Sexual Activity   Alcohol use: Yes    Alcohol/week: 14.0 standard drinks    Types: 14 Cans of beer per week    Comment:  occasional wine; about 2 beers/day   Drug use: Yes    Types: Marijuana    Comment: 1-2 week   Sexual activity: Never  Other Topics Concern   Not on file  Social History Narrative   Not on file   Social Determinants of Health   Financial Resource Strain:    Difficulty of Paying Living Expenses: Not on file  Food Insecurity:    Worried About [place] in the Last Year: Not on file   [employer] of Food in the Last Year: Not on file  Transportation Needs:    Lack of Transportation (Medical): Not on file   Lack of Transportation (Non-Medical): Not on file  Physical Activity:    Days of Exercise per Week: Not on file   Minutes of Exercise per Session: Not on file  Stress:    Feeling of Stress : Not on file  Social Connections:    Frequency of Communication with Friends and Family: Not on file   Frequency of Social Gatherings with Friends and Family: Not on file   Attends Religious Services: Not on file   Active Member of Clubs or Organizations: Not on file   Attends Archivist Meetings: Not on file   Marital Status: Not on file  Intimate Partner Violence:    Fear of Current or Ex-Partner: Not on file   Emotionally Abused: Not on file   Physically Abused: Not on file   Sexually Abused: Not on file    Family History  Problem Relation Age of Onset   Heart disease Father        MI   Hyperlipidemia Father    Hypertension Mother    Alcohol abuse Mother    Hyperlipidemia Brother    Hypertension Brother    Colon cancer Neg Hx    Colon polyps Neg Hx    Rectal cancer Neg Hx     Stomach cancer Neg Hx    Esophageal cancer Neg Hx      Review of Systems  Constitutional: Negative.  Negative for chills and fever.  HENT: Positive for hearing loss. Negative for congestion and sore throat.   Respiratory: Negative.  Negative for cough and shortness of breath.   Cardiovascular: Negative.  Negative for chest pain and palpitations.  Gastrointestinal: Negative.  Negative for abdominal pain, blood in  stool, diarrhea, melena, nausea and vomiting.  Genitourinary: Negative.  Negative for dysuria and hematuria.  Skin: Negative.  Negative for rash.  Neurological: Positive for dizziness. Negative for headaches.  All other systems reviewed and are negative.    Today's Vitals   09/12/20 1332  BP: (!) 162/72  Pulse: 72  Resp: 16  Temp: 98.1 F (36.7 C)  TempSrc: Temporal  SpO2: 97%  Weight: 173 lb (78.5 kg)  Height: 5\' 9"  (1.753 m)   Body mass index is 25.55 kg/m.   Physical Exam Vitals reviewed.  Constitutional:      Appearance: Normal appearance.  HENT:     Head: Normocephalic.     Right Ear: There is impacted cerumen.     Left Ear: There is impacted cerumen.  Eyes:     Extraocular Movements: Extraocular movements intact.     Pupils: Pupils are equal, round, and reactive to light.  Neck:     Vascular: No carotid bruit.  Cardiovascular:     Rate and Rhythm: Normal rate and regular rhythm.     Pulses: Normal pulses.     Heart sounds: Normal heart sounds.  Pulmonary:     Effort: Pulmonary effort is normal.     Breath sounds: Normal breath sounds.  Abdominal:     General: Bowel sounds are normal. There is no distension.     Palpations: Abdomen is soft.     Tenderness: There is no abdominal tenderness.  Musculoskeletal:        General: Normal range of motion.     Cervical back: Normal range of motion and neck supple.  Skin:    General: Skin is warm and dry.     Capillary Refill: Capillary refill takes less than 2 seconds.  Neurological:     General: No  focal deficit present.     Mental Status: He is alert and oriented to person, place, and time.  Psychiatric:        Mood and Affect: Mood normal.        Behavior: Behavior normal.    Ceruminosis is noted.  Wax is removed by syringing and manual debridement. Instructions for home care to prevent wax buildup are given.   ASSESSMENT & PLAN: Cleotis was seen today for establish care and cerumen impaction.  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  Health Maintenance After Age 75 After age 63, you are at a higher risk for certain long-term diseases and infections as well as injuries from falls. Falls are a major cause of broken bones and head injuries in people who are older than age 68. Getting regular preventive care can help to keep you healthy and well. Preventive care includes getting regular testing and making lifestyle changes as recommended by your health care provider. Talk with your health care provider about:  Which screenings and tests you should have. A screening is a test that checks for a disease when you have no symptoms.  A diet and exercise plan that is right for you. What should I know about screenings and tests to  prevent falls? Screening and testing are the best ways to find a health problem early. Early diagnosis and treatment give you the best chance of managing medical conditions that are common after age 18. Certain conditions and lifestyle choices may make you more likely to have a fall. Your health care provider may recommend:  Regular  vision checks. Poor vision and conditions such as cataracts can make you more likely to have a fall. If you wear glasses, make sure to get your prescription updated if your vision changes.  Medicine review. Work with your health care provider to regularly review all of the medicines you are taking, including over-the-counter medicines. Ask your health care provider about any side effects that may make you more likely to have a fall. Tell your health care provider if any medicines that you take make you feel dizzy or sleepy.  Osteoporosis screening. Osteoporosis is a condition that causes the bones to get weaker. This can make the bones weak and cause them to break more easily.  Blood pressure screening. Blood pressure changes and medicines to control blood pressure can make you feel dizzy.  Strength and balance checks. Your health care provider may recommend certain tests to check your strength and balance while standing, walking, or changing positions.  Foot health exam. Foot pain and numbness, as well as not wearing proper footwear, can make you more likely to have a fall.  Depression screening. You may be more likely to have a fall if you have a fear of falling, feel emotionally low, or feel unable to do activities that you used to do.  Alcohol use screening. Using too much alcohol can affect your balance and may make you more likely to have a fall. What actions can I take to lower my risk of falls? General instructions  Talk with your health care provider about your risks for falling. Tell your health care provider if: ? You fall. Be sure to tell your health care provider about all falls, even ones that seem minor. ? You feel dizzy, sleepy, or off-balance.  Take over-the-counter and prescription medicines only as told by your health care provider. These include any supplements.  Eat a healthy diet and maintain a healthy weight. A healthy diet includes low-fat dairy products, low-fat  (lean) meats, and fiber from whole grains, beans, and lots of fruits and vegetables. Home safety  Remove any tripping hazards, such as rugs, cords, and clutter.  Install safety equipment such as grab bars in bathrooms and safety rails on stairs.  Keep rooms and walkways well-lit. Activity   Follow a regular exercise program to stay fit. This will help you maintain your balance. Ask your health care provider what types of exercise are appropriate for you.  If you need a cane or walker, use it as recommended by your health care provider.  Wear supportive shoes that have nonskid soles. Lifestyle  Do not drink alcohol if your health care provider tells you not to drink.  If you drink alcohol, limit how much you have: ? 0-1 drink a day for women. ? 0-2 drinks a day for men.  Be aware of how much alcohol is in your drink. In the [place] one drink equals one typical bottle of beer (12 oz), one-half glass of wine (5 oz), or one shot of hard liquor (1 oz).  Do not use any products that contain nicotine or tobacco, such as cigarettes and e-cigarettes. If you need help quitting, ask your health care provider. Summary  Having a healthy lifestyle and getting preventive care can help to protect your health and wellness after age 86.  Screening and testing are the best way to find a health problem early and help you avoid having a fall. Early diagnosis and treatment give you the best chance for managing medical conditions that are more common for people who are older than age 69.  Falls are a major cause of broken bones and head injuries in people who are older than age 90. Take precautions to prevent a fall at home.  Work with your health care provider to learn what changes you can make to improve your health and wellness and to prevent falls. This information is not intended to replace advice given to you by your health care provider. Make sure you discuss any questions you have with your  health care provider. Document Revised: 02/24/2019 Document Reviewed: 09/16/2017 Elsevier Patient Education  [place].  Hypertension, Adult High blood pressure (hypertension) is when the force of blood pumping through the arteries is too strong. The arteries are the blood vessels that carry blood from the heart throughout the body. Hypertension forces the heart to work harder to pump blood and may cause arteries to become narrow or stiff. Untreated or uncontrolled hypertension can cause a heart attack, heart failure, a stroke, kidney disease, and other problems. A blood pressure reading consists of a higher number over a lower number. Ideally, your blood pressure should be below 120/80. The first ("top") number is called the systolic pressure. It is a measure of the pressure in your arteries as your heart beats. The second ("bottom") number is called the diastolic pressure. It is a measure of the pressure in your arteries as the heart relaxes. What are the causes? The exact cause of this condition is not known. There are some conditions that result in or are related to high blood pressure. What increases the risk? Some risk factors for high blood pressure are under your control. The following factors may make you more likely to develop this condition:  Smoking.  Having type 2 diabetes mellitus, high cholesterol, or both.  Not getting enough exercise or physical activity.  Being overweight.  Having too much fat, sugar, calories, or salt (sodium) in your diet.  Drinking too much alcohol. Some risk factors for high blood pressure may be difficult or impossible to change. Some of these factors include:  Having chronic kidney disease.  Having a family history of high blood pressure.  Age. Risk increases with age.  Race. You may be at higher risk if you are African American.  Gender. Men are at higher risk than women before age 24. After age 30, women are at higher risk than  men.  Having obstructive sleep apnea.  Stress. What are the signs or symptoms? High blood pressure may not cause symptoms. Very high blood pressure (hypertensive crisis) may cause:  Headache.  Anxiety.  Shortness of breath.  Nosebleed.  Nausea and vomiting.  Vision changes.  Severe chest pain.  Seizures. How is this diagnosed? This condition is diagnosed by measuring your blood pressure while you are seated, with your arm resting on a flat surface, your legs uncrossed, and your feet flat on the floor. The cuff of the blood pressure monitor will be placed directly against the skin of your upper arm at the level of your heart. It should be measured at least twice using the same arm. Certain conditions can cause a difference in blood pressure between  your right and left arms. Certain factors can cause blood pressure readings to be lower or higher than normal for a short period of time:  When your blood pressure is higher when you are in a health care provider's office than when you are at home, this is called white coat hypertension. Most people with this condition do not need medicines.  When your blood pressure is higher at home than when you are in a health care provider's office, this is called masked hypertension. Most people with this condition may need medicines to control blood pressure. If you have a high blood pressure reading during one visit or you have normal blood pressure with other risk factors, you may be asked to:  Return on a different day to have your blood pressure checked again.  Monitor your blood pressure at home for 1 week or longer. If you are diagnosed with hypertension, you may have other blood or imaging tests to help your health care provider understand your overall risk for other conditions. How is this treated? This condition is treated by making healthy lifestyle changes, such as eating healthy foods, exercising more, and reducing your alcohol  intake. Your health care provider may prescribe medicine if lifestyle changes are not enough to get your blood pressure under control, and if:  Your systolic blood pressure is above 130.  Your diastolic blood pressure is above 80. Your personal target blood pressure may vary depending on your medical conditions, your age, and other factors. Follow these instructions at home: Eating and drinking   Eat a diet that is high in fiber and potassium, and low in sodium, added sugar, and fat. An example eating plan is called the DASH (Dietary Approaches to Stop Hypertension) diet. To eat this way: ? Eat plenty of fresh fruits and vegetables. Try to fill one half of your plate at each meal with fruits and vegetables. ? Eat whole grains, such as whole-wheat pasta, brown rice, or whole-grain bread. Fill about one fourth of your plate with whole grains. ? Eat or drink low-fat dairy products, such as skim milk or low-fat yogurt. ? Avoid fatty cuts of meat, processed or cured meats, and poultry with skin. Fill about one fourth of your plate with lean proteins, such as fish, chicken without skin, beans, eggs, or tofu. ? Avoid pre-made and processed foods. These tend to be higher in sodium, added sugar, and fat.  Reduce your daily sodium intake. Most people with hypertension should eat less than 1,500 mg of sodium a day.  Do not drink alcohol if: ? Your health care provider tells you not to drink. ? You are pregnant, may be pregnant, or are planning to become pregnant.  If you drink alcohol: ? Limit how much you use to:  0-1 drink a day for women.  0-2 drinks a day for men. ? Be aware of how much alcohol is in your drink. In the [place] one drink equals one 12 oz bottle of beer (355 mL), one 5 oz glass of wine (148 mL), or one 1 oz glass of hard liquor (44 mL). Lifestyle   Work with your health care provider to maintain a healthy body weight or to lose weight. Ask what an ideal weight is for  you.  Get at least 30 minutes of exercise most days of the week. Activities may include walking, swimming, or biking.  Include exercise to strengthen your muscles (resistance exercise), such as Pilates or lifting weights, as part of your weekly  exercise routine. Try to do these types of exercises for 30 minutes at least 3 days a week.  Do not use any products that contain nicotine or tobacco, such as cigarettes, e-cigarettes, and chewing tobacco. If you need help quitting, ask your health care provider.  Monitor your blood pressure at home as told by your health care provider.  Keep all follow-up visits as told by your health care provider. This is important. Medicines  Take over-the-counter and prescription medicines only as told by your health care provider. Follow directions carefully. Blood pressure medicines must be taken as prescribed.  Do not skip doses of blood pressure medicine. Doing this puts you at risk for problems and can make the medicine less effective.  Ask your health care provider about side effects or reactions to medicines that you should watch for. Contact a health care provider if you:  Think you are having a reaction to a medicine you are taking.  Have headaches that keep coming back (recurring).  Feel dizzy.  Have swelling in your ankles.  Have trouble with your vision. Get help right away if you:  Develop a severe headache or confusion.  Have unusual weakness or numbness.  Feel faint.  Have severe pain in your chest or abdomen.  Vomit repeatedly.  Have trouble breathing. Summary  Hypertension is when the force of blood pumping through your arteries is too strong. If this condition is not controlled, it may put you at risk for serious complications.  Your personal target blood pressure may vary depending on your medical conditions, your age, and other factors. For most people, a normal blood pressure is less than 120/80.  Hypertension is  treated with lifestyle changes, medicines, or a combination of both. Lifestyle changes include losing weight, eating a healthy, low-sodium diet, exercising more, and limiting alcohol. This information is not intended to replace advice given to you by your health care provider. Make sure you discuss any questions you have with your health care provider. Document Revised: 07/14/2018 Document Reviewed: 07/14/2018 Elsevier Patient Education  [employer].     If you have lab work done today you will be contacted with your lab results within the next 2 weeks.  If you have not heard from [place] then please contact us. The fastest way to get your results is to register for My Chart.   IF you received an x-ray today, you will receive an invoice from [facility]. Please contact [facility] at [phone] with questions or concerns regarding your invoice.   IF you received labwork today, you will receive an invoice from [facility]. Please contact [facility] at [phone] with questions or concerns regarding your invoice.   Our billing staff will not be able to assist you with questions regarding bills from these companies.  You will be contacted with the lab results as soon as they are available. The fastest way to get your results is to activate your My Chart account. Instructions are located on the Earwax Buildup, Adult The ears produce a substance called earwax that helps keep bacteria out of the ear and protects the skin in the ear canal. Occasionally, earwax can build up in the ear and cause discomfort or hearing loss. What increases the risk? This condition is more likely to develop in people who: Are male. Are elderly. Naturally produce more earwax. Clean their ears often with cotton swabs. Use earplugs often. Use in-ear headphones often. Wear hearing aids. Have narrow ear canals. Have earwax that is overly  thick or sticky. Have eczema. Are dehydrated. Have excess  hair in the ear canal. What are the signs or symptoms? Symptoms of this condition include: Reduced or muffled hearing. A feeling of fullness in the ear or feeling that the ear is plugged. Fluid coming from the ear. Ear pain. Ear itch. Ringing in the ear. Coughing. An obvious piece of earwax that can be seen inside the ear canal. How is this diagnosed? This condition may be diagnosed based on: Your symptoms. Your medical history. An ear exam. During the exam, your health care provider will look into your ear with an instrument called an otoscope. You may have tests, including a hearing test. How is this treated? This condition may be treated by: Using ear drops to soften the earwax. Having the earwax removed by a health care provider. The health care provider may: Flush the ear with water. Use an instrument that has a loop on the end (curette). Use a suction device. Surgery to remove the wax buildup. This may be done in severe cases. Follow these instructions at home:  Take over-the-counter and prescription medicines only as told by your health care provider. Do not put any objects, including cotton swabs, into your ear. You can clean the opening of your ear canal with a washcloth or facial tissue. Follow instructions from your health care provider about cleaning your ears. Do not over-clean your ears. Drink enough fluid to keep your urine clear or pale yellow. This will help to thin the earwax. Keep all follow-up visits as told by your health care provider. If earwax builds up in your ears often or if you use hearing aids, consider seeing your health care provider for routine, preventive ear cleanings. Ask your health care provider how often you should schedule your cleanings. If you have hearing aids, clean them according to instructions from the manufacturer and your health care provider. Contact a health care provider if: You have ear pain. You develop a fever. You have blood,  pus, or other fluid coming from your ear. You have hearing loss. You have ringing in your ears that does not go away. Your symptoms do not improve with treatment. You feel like the room is spinning (vertigo). Summary Earwax can build up in the ear and cause discomfort or hearing loss. The most common symptoms of this condition include reduced or muffled hearing and a feeling of fullness in the ear or feeling that the ear is plugged. This condition may be diagnosed based on your symptoms, your medical history, and an ear exam. This condition may be treated by using ear drops to soften the earwax or by having the earwax removed by a health care provider. Do not put any objects, including cotton swabs, into your ear. You can clean the opening of your ear canal with a washcloth or facial tissue. This information is not intended to replace advice given to you by your health care provider. Make sure you discuss any questions you have with your health care provider. Document Revised: 10/16/2017 Document Reviewed: 01/14/2017 Elsevier Patient Education  [place].  weeks, please contact this office.         Agustina Caroli, MD [facility]

## 2020-11-05 DIAGNOSIS — Z23 Encounter for immunization: Secondary | ICD-10-CM | POA: Diagnosis not present

## 2020-12-13 ENCOUNTER — Other Ambulatory Visit: Payer: Self-pay | Admitting: Emergency Medicine

## 2020-12-13 DIAGNOSIS — I10 Essential (primary) hypertension: Secondary | ICD-10-CM

## 2020-12-13 MED ORDER — LISINOPRIL 20 MG PO TABS: 20.0000 mg | ORAL_TABLET | Freq: Every day | ORAL | 1 refills | Status: DC

## 2020-12-13 NOTE — Telephone Encounter (Signed)
Copied from [place] [id number]. Topic: Quick Communication - Rx Refill/Question >> Dec 13, 2020 10:01 AM Leward Quan A wrote: Medication: lisinopril (ZESTRIL) 20 MG tablet   Has the patient contacted their pharmacy? Yes.   (Agent: If no, request that the patient contact the pharmacy for the refill.) (Agent: If yes, when and what did the pharmacy advise?)  Preferred Pharmacy (with phone number or street name): [facility] [phone] - [place], [address]  Phone:  [phone] Fax:  [phone]     Agent: Please be advised that RX refills may take up to 3 business days. We ask that you follow-up with your pharmacy.

## 2021-01-10 ENCOUNTER — Ambulatory Visit: Payer: Medicare Other | Admitting: Cardiology

## 2021-01-25 ENCOUNTER — Telehealth: Payer: Self-pay | Admitting: Emergency Medicine

## 2021-01-25 NOTE — Telephone Encounter (Signed)
Negative. Thanks.

## 2021-01-25 NOTE — Telephone Encounter (Signed)
Patient is requesting to be a TOC. Please advise

## 2021-01-28 NOTE — Telephone Encounter (Signed)
Called pt, LVM to call the office.

## 2021-02-22 ENCOUNTER — Other Ambulatory Visit: Payer: Self-pay

## 2021-02-22 ENCOUNTER — Ambulatory Visit (INDEPENDENT_AMBULATORY_CARE_PROVIDER_SITE_OTHER): Payer: Medicare Other | Admitting: Cardiology

## 2021-02-22 ENCOUNTER — Encounter: Payer: Self-pay | Admitting: Cardiology

## 2021-02-22 VITALS — BP 130/70 | HR 67 | Ht 69.0 in | Wt 167.0 lb

## 2021-02-22 DIAGNOSIS — I10 Essential (primary) hypertension: Secondary | ICD-10-CM | POA: Diagnosis not present

## 2021-02-22 DIAGNOSIS — Z952 Presence of prosthetic heart valve: Secondary | ICD-10-CM | POA: Diagnosis not present

## 2021-02-22 MED ORDER — SILDENAFIL CITRATE 100 MG PO TABS: 100.0000 mg | ORAL_TABLET | Freq: Every day | ORAL | 3 refills | Status: DC | PRN

## 2021-02-22 NOTE — Patient Instructions (Signed)
Medication Instructions:  The current medical regimen is effective;  continue present plan and medications.  *If you need a refill on your cardiac medications before your next appointment, please call your pharmacy*  Follow-Up: At [facility], you and your health needs are our priority.  As part of our continuing mission to provide you with exceptional heart care, we have created designated Provider Care Teams.  These Care Teams include your primary Cardiologist (physician) and Advanced Practice Providers (APPs -  Physician Assistants and Nurse Practitioners) who all work together to provide you with the care you need, when you need it.  We recommend signing up for the patient portal called "MyChart".  Sign up information is provided on this After Visit Summary.  MyChart is used to connect with patients for Virtual Visits (Telemedicine).  Patients are able to view lab/test results, encounter notes, upcoming appointments, etc.  Non-urgent messages can be sent to your provider as well.   To learn more about what you can do with MyChart, go to [email or web address].    Your next appointment:   12 month(s)  The format for your next appointment:   In Person  Provider:   Mark Skains, MD   Thank you for choosing [facility]!!      

## 2021-02-22 NOTE — Progress Notes (Signed)
Cardiology Office Note:    Date:  02/22/2021   ID:  Charles Robles, DOB 1946-03-30, MRN [id number]  PCP:  No primary care provider on file.   [facility]  Cardiologist:  Candee Furbish, MD  Advanced Practice Provider:  No care team member to display Electrophysiologist:  None       Referring MD: Horald Pollen, *     History of Present Illness:    Charles Robles is a 75 y.o. male post aortic valve replacement, TAVR 07/12/2019 here for follow-up.  Dr. Angelena Form.  26 mm Edwards sapient 3 ultra T HV via the transfemoral approach.  Postop echo showed mean gradient of 8 mmHg.  No perivalvular leak.  Seems to doing quite well.  Has had some fatigue.  Past Medical History:  Diagnosis Date  . Abnormal large bowel motility   . Arthritis   . Hearing loss   . Heart murmur   . Hyperlipidemia   . Hypertension   . Melanoma ([place])    melanoma  . Other fatigue   . Pulmonary nodule    a. noted on pre TAVR CT, will need 1 year follow up given smoking history   . S/P TAVR (transcatheter aortic valve replacement)    a. s/p TAVR with a 26 mm Edwards sapein 3 Ultra valve via the TF approach on 07/12/19  . Severe aortic stenosis    a. s/p TAVR with a 26 mm Edwards sapein 3 Ultra valve via the TF approach on 07/12/19  . Stool incontinence   . Urgency of urination   . Vision loss of right eye     Past Surgical History:  Procedure Laterality Date  . COLONOSCOPY  2017  . EYE SURGERY  1981    Right eye- corneal transplant  . HERNIA REPAIR     x3  . POLYPECTOMY    . RIGHT/LEFT HEART CATH AND CORONARY ANGIOGRAPHY N/A 06/23/2018   Procedure: RIGHT/LEFT HEART CATH AND CORONARY ANGIOGRAPHY;  Surgeon: Burnell Blanks, MD;  Location: [facility];  Service: Cardiovascular;  Laterality: N/A;  . TEE WITHOUT CARDIOVERSION N/A 07/12/2019   Procedure: TRANSESOPHAGEAL ECHOCARDIOGRAM (TEE);  Surgeon: Burnell Blanks, MD;  Location: [facility];   Service: Open Heart Surgery;  Laterality: N/A;  . TONSILLECTOMY    . TRANSCATHETER AORTIC VALVE REPLACEMENT, TRANSFEMORAL N/A 07/12/2019   Procedure: TRANSCATHETER AORTIC VALVE REPLACEMENT, TRANSFEMORAL;  Surgeon: Burnell Blanks, MD;  Location: [facility];  Service: Open Heart Surgery;  Laterality: N/A;    Current Medications: Current Meds  Medication Sig  . amoxicillin (AMOXIL) 500 MG tablet Take 4 tablets (2,000 mg total) by mouth as directed. Take 4 tablets 1 hour prior to dental work, including cleanings.  Marland Kitchen aspirin 81 MG chewable tablet Chew 1 tablet (81 mg total) by mouth daily.  . cetirizine (ZYRTEC) 10 MG tablet Take 10 mg by mouth daily as needed for allergies.  Marland Kitchen doxylamine, Sleep, (UNISOM) 25 MG tablet Take 25 mg by mouth at bedtime.  . fish oil-omega-3 fatty acids 1000 MG capsule Take 1 g by mouth daily.   . Glucosamine-Chondroit-Vit C-Mn (GLUCOSAMINE 1500 COMPLEX PO) Take by mouth.  Marland Kitchen lisinopril (ZESTRIL) 20 MG tablet Take 1 tablet (20 mg total) by mouth daily.  Marland Kitchen nystatin cream (MYCOSTATIN) Apply 1 application topically 2 (two) times daily.  Marland Kitchen oxymetazoline (AFRIN) 0.05 % nasal spray Place 1-2 sprays into both nostrils 2 (two) times daily as needed for congestion.  . sildenafil (  VIAGRA) 100 MG tablet Take 1 tablet (100 mg total) by mouth daily as needed for erectile dysfunction.     Allergies:   Patient has no known allergies.   Social History   Socioeconomic History  . Marital status: Divorced    Spouse name: Not on file  . Number of children: 0  . Years of education: Not on file  . Highest education level: Not on file  Occupational History  . Occupation: Retired-Psychologist  Tobacco Use  . Smoking status: Current Some Day Smoker    Types: Cigars  . Smokeless tobacco: Never Used  Vaping Use  . Vaping Use: Never used  Substance and Sexual Activity  . Alcohol use: Yes    Alcohol/week: 14.0 standard drinks    Types: 14 Cans of beer per week    Comment:   occasional wine; about 2 beers/day  . Drug use: Yes    Types: Marijuana    Comment: 1-2 week  . Sexual activity: Never  Other Topics Concern  . Not on file  Social History Narrative  . Not on file   Social Determinants of Health   Financial Resource Strain: Not on file  Food Insecurity: Not on file  Transportation Needs: Not on file  Physical Activity: Not on file  Stress: Not on file  Social Connections: Not on file     Family History: The patient's family history includes Alcohol abuse in his mother; Heart disease in his father; Hyperlipidemia in his brother and father; Hypertension in his brother and mother. There is no history of Colon cancer, Colon polyps, Rectal cancer, Stomach cancer, or Esophageal cancer.  ROS:   Please see the history of present illness.     All other systems reviewed and are negative.  EKGs/Labs/Other Studies Reviewed:      EKG:  EKG is  ordered today.  The ekg ordered today demonstrates sinus rhythm 67 with nonspecific ST-T wave changes, ST depression noted in the inferior leads.  Recent Labs: 07/06/2020: ALT 17; BUN 23; Creatinine, Ser 1.15; Hemoglobin 13.9; Platelets 152; Potassium 4.8; Sodium 136; TSH 0.829  Recent Lipid Panel    Component Value Date/Time   CHOL 275 (H) 02/21/2019 1140   TRIG 125 02/21/2019 1140   HDL 89 02/21/2019 1140   CHOLHDL 3.1 02/21/2019 1140   CHOLHDL 3.4 01/16/2016 1207   VLDL 38 (H) 01/16/2016 1207   LDLCALC 161 (H) 02/21/2019 1140     Risk Assessment/Calculations:      Physical Exam:    VS:  BP 130/70 (BP Location: Left Arm, Patient Position: Sitting, Cuff Size: Normal)   Pulse 67   Ht 5\' 9"  (1.753 m)   Wt 167 lb (75.8 kg)   SpO2 97%   BMI 24.66 kg/m     Wt Readings from Last 3 Encounters:  02/22/21 167 lb (75.8 kg)  09/12/20 173 lb (78.5 kg)  07/06/20 171 lb 9.6 oz (77.8 kg)     GEN:  Well nourished, well developed in no acute distress HEENT: Normal NECK: No JVD; No carotid  bruits LYMPHATICS: No lymphadenopathy CARDIAC: RRR, soft systolic murmur,no rubs, gallops RESPIRATORY:  Clear to auscultation without rales, wheezing or rhonchi  ABDOMEN: Soft, non-tender, non-distended MUSCULOSKELETAL:  No edema; No deformity  SKIN: Warm and dry NEUROLOGIC:  Alert and oriented x 3 PSYCHIATRIC:  Normal affect   ASSESSMENT:    1. S/P TAVR (transcatheter aortic valve replacement)   2. Essential hypertension    PLAN:    In order of  problems listed above:  TAVR, aortic valve replacement prior severe aortic stenosis -Overall doing quite well.  He is feeling some fatigue generalized.  Blood work unremarkable.  Echo reassuring.  Essential hypertension Reasonably controlled One lisinopril.   Hyperlipidemia -Prior LDL 161.  Not interested in statin.  Pulmonary nodule -2 mm right lower lobe nodule.  CT surveillance.  Subsequent CT showed calcified granuloma.  Erectile dysfunction -We will give him Viagra 100 mg 10 tablets 3 refills.  He is at liberty to try 50 mg if necessary.  Dizziness -Sounds to me more like vertigo type symptoms, positional change, disequilibrium.  May wish to discuss further with his ear nose and throat doctor.  Might benefit from vestibular therapy/PT  78yr    Medication Adjustments/Labs and Tests Ordered: Current medicines are reviewed at length with the patient today.  Concerns regarding medicines are outlined above.  Orders Placed This Encounter  Procedures  . EKG 12-Lead   Meds ordered this encounter  Medications  . sildenafil (VIAGRA) 100 MG tablet    Sig: Take 1 tablet (100 mg total) by mouth daily as needed for erectile dysfunction.    Dispense:  10 tablet    Refill:  3    Patient Instructions  Medication Instructions:  The current medical regimen is effective;  continue present plan and medications.  *If you need a refill on your cardiac medications before your next appointment, please call your pharmacy*  Follow-Up: At  [facility], you and your health needs are our priority.  As part of our continuing mission to provide you with exceptional heart care, we have created designated Provider Care Teams.  These Care Teams include your primary Cardiologist (physician) and Advanced Practice Providers (APPs -  Physician Assistants and Nurse Practitioners) who all work together to provide you with the care you need, when you need it.  We recommend signing up for the patient portal called "MyChart".  Sign up information is provided on this After Visit Summary.  MyChart is used to connect with patients for Virtual Visits (Telemedicine).  Patients are able to view lab/test results, encounter notes, upcoming appointments, etc.  Non-urgent messages can be sent to your provider as well.   To learn more about what you can do with MyChart, go to [email or web address].    Your next appointment:   12 month(s)  The format for your next appointment:   In Person  Provider:   Candee Furbish, MD   Thank you for choosing [facility]!!        Signed, Candee Furbish, MD  02/22/2021 5:10 PM    [place]

## 2021-03-13 ENCOUNTER — Ambulatory Visit: Payer: Self-pay | Admitting: Emergency Medicine

## 2021-04-24 ENCOUNTER — Encounter: Payer: Self-pay | Admitting: Internal Medicine

## 2021-04-24 ENCOUNTER — Ambulatory Visit (INDEPENDENT_AMBULATORY_CARE_PROVIDER_SITE_OTHER): Payer: Medicare Other | Admitting: Internal Medicine

## 2021-04-24 ENCOUNTER — Other Ambulatory Visit: Payer: Self-pay

## 2021-04-24 VITALS — BP 120/64 | HR 73 | Temp 98.1°F | Ht 69.0 in | Wt 165.0 lb

## 2021-04-24 DIAGNOSIS — S0990XA Unspecified injury of head, initial encounter: Secondary | ICD-10-CM | POA: Diagnosis not present

## 2021-04-24 DIAGNOSIS — K625 Hemorrhage of anus and rectum: Secondary | ICD-10-CM | POA: Insufficient documentation

## 2021-04-24 DIAGNOSIS — N1831 Chronic kidney disease, stage 3a: Secondary | ICD-10-CM | POA: Insufficient documentation

## 2021-04-24 DIAGNOSIS — K920 Hematemesis: Secondary | ICD-10-CM | POA: Diagnosis not present

## 2021-04-24 DIAGNOSIS — E785 Hyperlipidemia, unspecified: Secondary | ICD-10-CM | POA: Diagnosis not present

## 2021-04-24 DIAGNOSIS — K2101 Gastro-esophageal reflux disease with esophagitis, with bleeding: Secondary | ICD-10-CM

## 2021-04-24 DIAGNOSIS — Z23 Encounter for immunization: Secondary | ICD-10-CM | POA: Diagnosis not present

## 2021-04-24 DIAGNOSIS — I10 Essential (primary) hypertension: Secondary | ICD-10-CM

## 2021-04-24 DIAGNOSIS — R270 Ataxia, unspecified: Secondary | ICD-10-CM | POA: Diagnosis not present

## 2021-04-24 LAB — BASIC METABOLIC PANEL
BUN: 26 mg/dL — ABNORMAL HIGH (ref 6–23)
CO2: 24 mEq/L (ref 19–32)
Calcium: 9.1 mg/dL (ref 8.4–10.5)
Chloride: 107 mEq/L (ref 96–112)
Creatinine, Ser: 1.19 mg/dL (ref 0.40–1.50)
GFR: 59.59 mL/min — ABNORMAL LOW (ref >60.00)
Glucose, Bld: 116 mg/dL — ABNORMAL HIGH (ref 70–99)
Potassium: 4.1 mEq/L (ref 3.5–5.1)
Sodium: 138 mEq/L (ref 135–145)

## 2021-04-24 LAB — LIPID PANEL
Cholesterol: 234 mg/dL — ABNORMAL HIGH (ref 0–200)
HDL: 62 mg/dL (ref >39.00)
Total CHOL/HDL Ratio: 4
Triglycerides: 426 mg/dL — ABNORMAL HIGH (ref 0.0–149.0)

## 2021-04-24 LAB — CBC WITH DIFFERENTIAL/PLATELET
Basophils Absolute: 0 10*3/uL (ref 0.0–0.1)
Basophils Relative: 0.7 % (ref 0.0–3.0)
Eosinophils Absolute: 0.2 10*3/uL (ref 0.0–0.7)
Eosinophils Relative: 2.9 % (ref 0.0–5.0)
HCT: 38.1 % — ABNORMAL LOW (ref 39.0–52.0)
Hemoglobin: 13 g/dL (ref 13.0–17.0)
Lymphocytes Relative: 22 % (ref 12.0–46.0)
Lymphs Abs: 1.3 10*3/uL (ref 0.7–4.0)
MCHC: 34.1 g/dL (ref 30.0–36.0)
MCV: 95.4 fl (ref 78.0–100.0)
Monocytes Absolute: 0.5 10*3/uL (ref 0.1–1.0)
Monocytes Relative: 9 % (ref 3.0–12.0)
Neutro Abs: 3.7 10*3/uL (ref 1.4–7.7)
Neutrophils Relative %: 65.4 % (ref 43.0–77.0)
Platelets: 185 10*3/uL (ref 150.0–400.0)
RBC: 3.99 Mil/uL — ABNORMAL LOW (ref 4.22–5.81)
RDW: 13.1 % (ref 11.5–15.5)
WBC: 5.7 10*3/uL (ref 4.0–10.5)

## 2021-04-24 LAB — LDL CHOLESTEROL, DIRECT: Direct LDL: 135 mg/dL

## 2021-04-24 MED ORDER — SHINGRIX 50 MCG/0.5ML IM SUSR: 0.5000 mL | Freq: Once | INTRAMUSCULAR | 1 refills | Status: AC

## 2021-04-24 MED ORDER — BOOSTRIX 5-2.5-18.5 LF-MCG/0.5 IM SUSP
0.5000 mL | Freq: Once | INTRAMUSCULAR | 0 refills | Status: AC
Start: 2021-04-24 — End: 2021-04-24

## 2021-04-24 MED ORDER — ROSUVASTATIN CALCIUM 10 MG PO TABS: 10.0000 mg | ORAL_TABLET | Freq: Every day | ORAL | 1 refills | Status: DC

## 2021-04-24 NOTE — Patient Instructions (Signed)

## 2021-04-24 NOTE — Progress Notes (Signed)
Subjective:  Patient ID: Charles Robles, male    DOB: 11/17/1946  Age: 75 y.o. MRN: [id number]  CC: Hypertension  This visit occurred during the SARS-CoV-2 public health emergency.  Safety protocols were in place, including screening questions prior to the visit, additional usage of staff PPE, and extensive cleaning of exam room while observing appropriate contact time as indicated for disinfecting solutions.    HPI Charles Robles presents for f/up and to establish.  He complains that about 2 weeks ago he ate some bad food and had a brief episode of nausea and vomiting.  At 1 point he vomited up substance that was black.  He takes ibuprofen for chronic pain and drinks about 4 beers a day.  He complains of low libido, erectile dysfunction, dizziness, or arthralgias, and lightheadedness.  He tells me that about 3 months ago he tripped over his dogs and fell in his home and hit his head on a hardwood floor.  Since then he has had mild slurred speech and ataxia.  He has intermittent rectal bleeding that he thinks is hemorrhoidal in nature.  Outpatient Medications Prior to Visit  Medication Sig Dispense Refill   amoxicillin (AMOXIL) 500 MG tablet Take 4 tablets (2,000 mg total) by mouth as directed. Take 4 tablets 1 hour prior to dental work, including cleanings. 12 tablet 12   aspirin 81 MG chewable tablet Chew 1 tablet (81 mg total) by mouth daily. 90 tablet 1   cetirizine (ZYRTEC) 10 MG tablet Take 10 mg by mouth daily as needed for allergies.     fish oil-omega-3 fatty acids 1000 MG capsule Take 1 g by mouth daily.      Glucosamine-Chondroit-Vit C-Mn (GLUCOSAMINE 1500 COMPLEX PO) Take by mouth.     nystatin cream (MYCOSTATIN) Apply 1 application topically 2 (two) times daily. 30 g 1   sildenafil (VIAGRA) 100 MG tablet Take 1 tablet (100 mg total) by mouth daily as needed for erectile dysfunction. 10 tablet 3   doxylamine, Sleep, (UNISOM) 25 MG tablet Take 25 mg by mouth at  bedtime.     lisinopril (ZESTRIL) 20 MG tablet Take 1 tablet (20 mg total) by mouth daily. 90 tablet 1   oxymetazoline (AFRIN) 0.05 % nasal spray Place 1-2 sprays into both nostrils 2 (two) times daily as needed for congestion.     No facility-administered medications prior to visit.    ROS Review of Systems  Constitutional:  Negative for appetite change, chills, diaphoresis, fatigue and fever.  HENT:  Negative for sore throat and trouble swallowing.   Cardiovascular:  Negative for chest pain, palpitations and leg swelling.  Gastrointestinal:  Positive for anal bleeding and blood in stool. Negative for abdominal pain, constipation, diarrhea, nausea and vomiting.  Genitourinary: Negative.  Negative for difficulty urinating and dysuria.  Musculoskeletal:  Positive for arthralgias and gait problem. Negative for back pain.  Skin: Negative.  Negative for color change and rash.  Neurological:  Positive for dizziness, speech difficulty and light-headedness. Negative for tremors, seizures, syncope, weakness and numbness.  Hematological:  Negative for adenopathy. Does not bruise/bleed easily.  Psychiatric/Behavioral: Negative.     Objective:  BP 120/64 (BP Location: Right Arm, Patient Position: Sitting, Cuff Size: Large)   Pulse 73   Temp 98.1 F (36.7 C) (Oral)   Ht 5\' 9"  (1.753 m)   Wt 165 lb (74.8 kg)   SpO2 97%   BMI 24.37 kg/m   BP Readings from Last 3 Encounters:  04/24/21 120/64  02/22/21 130/70  09/12/20 (!) 170/80    Wt Readings from Last 3 Encounters:  04/24/21 165 lb (74.8 kg)  02/22/21 167 lb (75.8 kg)  09/12/20 173 lb (78.5 kg)    Physical Exam Vitals reviewed.  Constitutional:      Appearance: Normal appearance. He is not ill-appearing.  HENT:     Nose: Nose normal.     Mouth/Throat:     Mouth: Mucous membranes are moist.  Eyes:     General: No scleral icterus.    Conjunctiva/sclera: Conjunctivae normal.  Cardiovascular:     Rate and Rhythm: Normal rate and  regular rhythm.     Heart sounds: Murmur heard.  Systolic murmur is present with a grade of 2/6.  No diastolic murmur is present.    No friction rub. No gallop.  Pulmonary:     Effort: Pulmonary effort is normal.     Breath sounds: No stridor. No wheezing, rhonchi or rales.  Abdominal:     General: Abdomen is flat.     Palpations: There is no mass.     Tenderness: There is no abdominal tenderness. There is no guarding or rebound.  Musculoskeletal:        General: Normal range of motion.     Cervical back: Neck supple.     Right lower leg: No edema.     Left lower leg: No edema.  Lymphadenopathy:     Cervical: No cervical adenopathy.  Skin:    General: Skin is warm and dry.     Coloration: Skin is not jaundiced or pale.  Neurological:     General: No focal deficit present.     Mental Status: He is alert and oriented to person, place, and time. Mental status is at baseline.  Psychiatric:        Mood and Affect: Mood normal.        Speech: Speech is tangential.        Behavior: Behavior normal.    Lab Results  Component Value Date   WBC 5.7 04/24/2021   HGB 13.0 04/24/2021   HCT 38.1 (L) 04/24/2021   PLT 185.0 04/24/2021   GLUCOSE 116 (H) 04/24/2021   CHOL 234 (H) 04/24/2021   TRIG (H) 04/24/2021    426.0 Triglyceride is over 400; calculations on Lipids are invalid.   HDL 62.00 04/24/2021   LDLDIRECT 135.0 04/24/2021   LDLCALC 161 (H) 02/21/2019   ALT 17 07/06/2020   AST 18 07/06/2020   NA 138 04/24/2021   K 4.1 04/24/2021   CL 107 04/24/2021   CREATININE 1.19 04/24/2021   BUN 26 (H) 04/24/2021   CO2 24 04/24/2021   TSH 0.829 07/06/2020   PSA 0.77 01/16/2016   INR 1.0 07/08/2019   HGBA1C 5.5 07/08/2019    CT Chest Wo Contrast  Result Date: 07/07/2020 CLINICAL DATA:  History of pulmonary nodule status post TAVR with history of melanoma as well. EXAM: CT CHEST WITHOUT CONTRAST TECHNIQUE: Multidetector CT imaging of the chest was performed following the standard  protocol without IV contrast. COMPARISON:  05/12/2019 FINDINGS: Cardiovascular: Scattered calcified atheromatous plaque in the thoracic aorta. Post trans arterial aortic valve replacement since the previous study. No pericardial effusion. Three-vessel coronary artery disease with calcification. Limited assessment of vascular structures in the chest due to lack of intravenous contrast. Central pulmonary vasculature is of normal caliber. Mediastinum/Nodes: Thoracic inlet structures are normal. No axillary lymphadenopathy. No mediastinal or hilar adenopathy. Esophagus is grossly normal. Lungs/Pleura: Mild subpleural reticulation at  the lung bases without bronchiectasis or honeycombing. No consolidation. No pleural effusion. Calcified granuloma in the RIGHT lung base is 2-3 mm. Airways are patent. Mild tree-in-bud opacity in the lingula in the LEFT chest, stable. Upper Abdomen: Incidental imaging of upper abdominal contents without acute finding. Adrenal glands are normal. Musculoskeletal: No acute musculoskeletal process. Spinal degenerative changes. IMPRESSION: 1. Stable chronic changes of mild pleural and parenchymal scarring and potential sequela of prior infection in the lingula. 2. Calcified granuloma in the RIGHT lower lobe. 3. Post trans arterial aortic valve replacement since the previous study. 4. Three-vessel coronary artery disease with calcification. 5. Aortic atherosclerosis. Aortic Atherosclerosis (ICD10-I70.0). Electronically Signed   By: Zetta Bills M.D.   On: 07/07/2020 16:23    Assessment & Plan:   [place] was seen today for hypertension.  Diagnoses and all orders for this visit:  Primary hypertension- His blood pressure is overcontrolled and he is symptomatic.  I recommended that he stop taking the ACE inhibitor. -     Basic metabolic panel; Future -     CBC with Differential/Platelet; Future -     CBC with Differential/Platelet -     Basic metabolic panel  Dyslipidemia, goal  LDL below 160- He has a modestly elevated ASCVD risk score.  I recommend that he take a statin for CV risk reduction. -     Lipid panel; Future -     Lipid panel -     rosuvastatin (CRESTOR) 10 MG tablet; Take 1 tablet (10 mg total) by mouth daily.  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  Need for shingles vaccine -     Zoster Vaccine Adjuvanted [facility]) injection; Inject 0.5 mLs into the muscle once for 1 dose.  Need for vaccination -     Pneumococcal polysaccharide vaccine 23-valent greater than or equal to 2yo subcutaneous/IM  Stage 3a chronic kidney disease ([place])- His blood pressure is adequately well controlled.  I recommended that he stop taking NSAIDs.  Coffee ground emesis- It sounds like he had episode of coffee-ground emesis.  He has multiple risk factors for upper GI pathology.  I recommended that he start taking a PPI and sucralfate.  I have also asked him to see GI to see if he needs to undergo upper endoscopy. -     Ambulatory referral to Gastroenterology -     esomeprazole (NEXIUM) 40 MG capsule; Take 1 capsule (40 mg total) by mouth daily. -     sucralfate (CARAFATE) 1 g tablet; Take 1 tablet (1 g total) by mouth 4 (four) times daily -  with meals and at bedtime.  Gastroesophageal reflux disease with esophagitis and hemorrhage -     esomeprazole (NEXIUM) 40 MG capsule; Take 1 capsule (40 mg total) by mouth daily. -     sucralfate (CARAFATE) 1 g tablet; Take 1 tablet (1 g total) by mouth 4 (four) times daily -  with meals and at bedtime.  Other orders -     LDL cholesterol, direct  I have discontinued Wynona Luna. Bernette Redbird or Dr. Jordan Hawks doxylamine (Sleep), oxymetazoline, and lisinopril. I am also  having him start on Boostrix, Shingrix, rosuvastatin, esomeprazole, and sucralfate. Additionally, I am having him maintain  his fish oil-omega-3 fatty acids, cetirizine, aspirin, amoxicillin, nystatin cream, Glucosamine-Chondroit-Vit C-Mn (GLUCOSAMINE 1500 COMPLEX PO), and sildenafil.  Meds ordered this encounter  Medications   Tdap (BOOSTRIX) 5-2.5-18.5 LF-MCG/0.5 injection    Sig: Inject 0.5 mLs into the muscle once for 1 dose.    Dispense:  0.5 mL    Refill:  0   Zoster Vaccine Adjuvanted [facility]) injection    Sig: Inject 0.5 mLs into the muscle once for 1 dose.    Dispense:  0.5 mL    Refill:  1   rosuvastatin (CRESTOR) 10 MG tablet    Sig: Take 1 tablet (10 mg total) by mouth daily.    Dispense:  90 tablet    Refill:  1   esomeprazole (NEXIUM) 40 MG capsule    Sig: Take 1 capsule (40 mg total) by mouth daily.    Dispense:  90 capsule    Refill:  1   sucralfate (CARAFATE) 1 g tablet    Sig: Take 1 tablet (1 g total) by mouth 4 (four) times daily -  with meals and at bedtime.    Dispense:  360 tablet    Refill:  1     Follow-up: Return in about 3 months (around 07/25/2021).  Scarlette Calico, MD

## 2021-04-25 DIAGNOSIS — K2101 Gastro-esophageal reflux disease with esophagitis, with bleeding: Secondary | ICD-10-CM | POA: Insufficient documentation

## 2021-04-25 MED ORDER — SUCRALFATE 1 G PO TABS: 1.0000 g | ORAL_TABLET | Freq: Three times a day (TID) | ORAL | 1 refills | Status: DC

## 2021-04-25 MED ORDER — ESOMEPRAZOLE MAGNESIUM 40 MG PO CPDR: 40.0000 mg | DELAYED_RELEASE_CAPSULE | Freq: Every day | ORAL | 1 refills | Status: DC

## 2021-05-07 ENCOUNTER — Ambulatory Visit
Admission: RE | Admit: 2021-05-07 | Discharge: 2021-05-07 | Disposition: A | Discharge status: Home / Self Care | Payer: Medicare Other | Source: Ambulatory Visit | Attending: Internal Medicine | Admitting: Internal Medicine

## 2021-05-07 ENCOUNTER — Other Ambulatory Visit: Payer: Self-pay

## 2021-05-07 DIAGNOSIS — S0990XA Unspecified injury of head, initial encounter: Secondary | ICD-10-CM

## 2021-05-07 DIAGNOSIS — R270 Ataxia, unspecified: Secondary | ICD-10-CM | POA: Diagnosis not present

## 2021-05-09 NOTE — Progress Notes (Signed)
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 EXAM: MRI HEAD WITHOUT CONTRAST TECHNIQUE: Multiplanar, multiecho pulse sequences of the brain and surrounding structures were obtained without intravenous contrast. COMPARISON:  Head CT February 16, 2012 FINDINGS: Brain: No acute infarction, hemorrhage, hydrocephalus, extra-axial collection or mass lesion. Scattered foci of T2 hyperintensity are seen within the white matter of the cerebral hemispheres and within the pons, nonspecific, most likely related to chronic small vessel ischemia. Remote lacunar infarct in the bilateral cerebellar hemispheres and bilateral corona radiata. Mild parenchymal volume loss. Vascular: Normal flow voids. Skull and upper cervical spine: Normal marrow signal. Sinuses/Orbits: Mild mucosal thickening of the left maxillary sinus. The orbits are maintained. Other: Mild left mastoid effusion. IMPRESSION: 1. No acute intracranial abnormality. 2. Moderate chronic microvascular ischemic changes of the white matter mild parenchymal volume loss. 3. 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 Electronically Signed   By: Pedro Earls M.D.   On: 05/08/2021 15:35

## 2021-05-10 ENCOUNTER — Other Ambulatory Visit: Payer: Self-pay | Admitting: Internal Medicine

## 2021-05-10 DIAGNOSIS — I6381 Other cerebral infarction due to occlusion or stenosis of small artery: Secondary | ICD-10-CM | POA: Insufficient documentation

## 2021-05-16 ENCOUNTER — Encounter: Payer: Self-pay | Admitting: Internal Medicine

## 2021-05-16 ENCOUNTER — Other Ambulatory Visit: Payer: Self-pay

## 2021-05-16 ENCOUNTER — Ambulatory Visit (INDEPENDENT_AMBULATORY_CARE_PROVIDER_SITE_OTHER): Payer: Medicare Other | Admitting: Internal Medicine

## 2021-05-16 VITALS — BP 160/80 | HR 73 | Temp 98.3°F | Resp 16 | Ht 69.0 in | Wt 167.0 lb

## 2021-05-16 DIAGNOSIS — R739 Hyperglycemia, unspecified: Secondary | ICD-10-CM | POA: Diagnosis not present

## 2021-05-16 DIAGNOSIS — I6381 Other cerebral infarction due to occlusion or stenosis of small artery: Secondary | ICD-10-CM

## 2021-05-16 DIAGNOSIS — D510 Vitamin B12 deficiency anemia due to intrinsic factor deficiency: Secondary | ICD-10-CM | POA: Insufficient documentation

## 2021-05-16 DIAGNOSIS — D539 Nutritional anemia, unspecified: Secondary | ICD-10-CM | POA: Diagnosis not present

## 2021-05-16 DIAGNOSIS — E781 Pure hyperglyceridemia: Secondary | ICD-10-CM

## 2021-05-16 DIAGNOSIS — I10 Essential (primary) hypertension: Secondary | ICD-10-CM

## 2021-05-16 LAB — CBC WITH DIFFERENTIAL/PLATELET
Basophils Absolute: 0 10*3/uL (ref 0.0–0.1)
Basophils Relative: 0.7 % (ref 0.0–3.0)
Eosinophils Absolute: 0.2 10*3/uL (ref 0.0–0.7)
Eosinophils Relative: 2.5 % (ref 0.0–5.0)
HCT: 39 % (ref 39.0–52.0)
Hemoglobin: 13.3 g/dL (ref 13.0–17.0)
Lymphocytes Relative: 23.4 % (ref 12.0–46.0)
Lymphs Abs: 1.7 10*3/uL (ref 0.7–4.0)
MCHC: 34.2 g/dL (ref 30.0–36.0)
MCV: 95.8 fl (ref 78.0–100.0)
Monocytes Absolute: 0.7 10*3/uL (ref 0.1–1.0)
Monocytes Relative: 10.3 % (ref 3.0–12.0)
Neutro Abs: 4.6 10*3/uL (ref 1.4–7.7)
Neutrophils Relative %: 63.1 % (ref 43.0–77.0)
Platelets: 134 10*3/uL — ABNORMAL LOW (ref 150.0–400.0)
RBC: 4.07 Mil/uL — ABNORMAL LOW (ref 4.22–5.81)
RDW: 13.4 % (ref 11.5–15.5)
WBC: 7.2 10*3/uL (ref 4.0–10.5)

## 2021-05-16 LAB — TRIGLYCERIDES: Triglycerides: 378 mg/dL — ABNORMAL HIGH (ref 0.0–149.0)

## 2021-05-16 LAB — HEMOGLOBIN A1C: Hgb A1c MFr Bld: 5.4 % (ref 4.6–6.5)

## 2021-05-16 LAB — FOLATE: Folate: 11.1 ng/mL (ref >5.9)

## 2021-05-16 LAB — FERRITIN: Ferritin: 47.5 ng/mL (ref 22.0–322.0)

## 2021-05-16 LAB — VITAMIN B12: Vitamin B-12: 167 pg/mL — ABNORMAL LOW (ref 211–911)

## 2021-05-16 LAB — IRON: Iron: 94 ug/dL (ref 42–165)

## 2021-05-16 MED ORDER — OLMESARTAN MEDOXOMIL 20 MG PO TABS: 20.0000 mg | ORAL_TABLET | Freq: Every day | ORAL | 1 refills | Status: DC

## 2021-05-16 NOTE — Progress Notes (Signed)
Subjective:  Patient ID: Charles Robles, male    DOB: [birth year]  Age: 75 y.o. MRN: [id number]  CC: Hypertension, Anemia, and Hyperlipidemia  This visit occurred during the SARS-CoV-2 public health emergency.  Safety protocols were in place, including screening questions prior to the visit, additional usage of staff PPE, and extensive cleaning of exam room while observing appropriate contact time as indicated for disinfecting solutions.    HPI Charles Robles presents for f/up -  His recent labs revealed a mild anemia and low PLT ct.. His MRI showed moderate chronic microvascular ischemic changes of the white matter mild parenchymal volume loss. 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 He has had no more episodes of hematemesis.  Outpatient Medications Prior to Visit  Medication Sig Dispense Refill   amoxicillin (AMOXIL) 500 MG tablet Take 4 tablets (2,000 mg total) by mouth as directed. Take 4 tablets 1 hour prior to dental work, including cleanings. 12 tablet 12   aspirin 81 MG chewable tablet Chew 1 tablet (81 mg total) by mouth daily. 90 tablet 1   cetirizine (ZYRTEC) 10 MG tablet Take 10 mg by mouth daily as needed for allergies.     esomeprazole (NEXIUM) 40 MG capsule Take 1 capsule (40 mg total) by mouth daily. 90 capsule 1   fish oil-omega-3 fatty acids 1000 MG capsule Take 1 g by mouth daily.      Glucosamine-Chondroit-Vit C-Mn (GLUCOSAMINE 1500 COMPLEX PO) Take by mouth.     nystatin cream (MYCOSTATIN) Apply 1 application topically 2 (two) times daily. 30 g 1   rosuvastatin (CRESTOR) 10 MG tablet Take 1 tablet (10 mg total) by mouth daily. 90 tablet 1   sildenafil (VIAGRA) 100 MG tablet Take 1 tablet (100 mg total) by mouth daily as needed for erectile dysfunction. 10 tablet 3   sucralfate (CARAFATE) 1 g tablet Take 1 tablet (1 g total) by mouth 4 (four) times daily -   with meals and at bedtime. 360 tablet 1   No facility-administered medications prior to visit.    ROS Review of Systems  Constitutional:  Negative for diaphoresis, fatigue and fever.  HENT: Negative.    Eyes:  Negative for visual disturbance.  Respiratory:  Negative for cough, chest tightness, shortness of breath and wheezing.   Cardiovascular:  Negative for chest pain, palpitations and leg swelling.  Gastrointestinal:  Negative for abdominal pain, constipation, diarrhea and nausea.  Endocrine: Negative.   Genitourinary:  Negative for difficulty urinating.  Musculoskeletal:  Negative for arthralgias and myalgias.  Skin: Negative.  Negative for color change and pallor.  Allergic/Immunologic: Negative.   Neurological: Negative.  Negative for dizziness, weakness and numbness.  Hematological:  Negative for adenopathy. Does not bruise/bleed easily.  Psychiatric/Behavioral: Negative.     Objective:  BP (!) 160/80 (BP Location: Right Arm, Patient Position: Sitting, Cuff Size: Large)   Pulse 73   Temp 98.3 F (36.8 C) (Oral)   Resp 16   Ht 5\' 9"  (1.753 m)   Wt 167 lb (75.8 kg)   SpO2 98%   BMI 24.66 kg/m   BP Readings from Last 3 Encounters:  05/16/21 (!) 160/80  04/24/21 120/64  02/22/21 130/70    Wt Readings from Last 3 Encounters:  05/16/21 167 lb (75.8 kg)  04/24/21 165 lb (74.8 kg)  02/22/21 167 lb (75.8 kg)    Physical Exam Vitals reviewed.  Constitutional:  Appearance: Normal appearance.  HENT:     Nose: Nose normal.     Mouth/Throat:     Mouth: Mucous membranes are moist.  Eyes:     General: No scleral icterus.    Conjunctiva/sclera: Conjunctivae normal.  Cardiovascular:     Rate and Rhythm: Normal rate and regular rhythm.     Heart sounds: No murmur heard. Pulmonary:     Effort: Pulmonary effort is normal.     Breath sounds: No stridor. No wheezing, rhonchi or rales.  Abdominal:     General: Abdomen is flat. Bowel sounds are normal. There is no  distension.     Palpations: Abdomen is soft. There is no hepatomegaly, splenomegaly or mass.  Musculoskeletal:        General: Normal range of motion.     Cervical back: Neck supple.  Lymphadenopathy:     Cervical: No cervical adenopathy.  Skin:    General: Skin is warm and dry.  Neurological:     General: No focal deficit present.     Mental Status: He is alert. Mental status is at baseline.  Psychiatric:        Mood and Affect: Mood normal.        Behavior: Behavior normal.    Lab Results  Component Value Date   WBC 7.2 05/16/2021   HGB 13.3 05/16/2021   HCT 39.0 05/16/2021   PLT 134.0 (L) 05/16/2021   GLUCOSE 116 (H) 04/24/2021   CHOL 234 (H) 04/24/2021   TRIG 378.0 (H) 05/16/2021   HDL 62.00 04/24/2021   LDLDIRECT 135.0 04/24/2021   LDLCALC 161 (H) 02/21/2019   ALT 17 07/06/2020   AST 18 07/06/2020   NA 138 04/24/2021   K 4.1 04/24/2021   CL 107 04/24/2021   CREATININE 1.19 04/24/2021   BUN 26 (H) 04/24/2021   CO2 24 04/24/2021   TSH 0.829 07/06/2020   PSA 0.77 01/16/2016   INR 1.0 07/08/2019   HGBA1C 5.4 05/16/2021    MR Brain Wo Contrast  Result Date: 05/08/2021 CLINICAL DATA:  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 EXAM: MRI HEAD WITHOUT CONTRAST TECHNIQUE: Multiplanar, multiecho pulse sequences of the brain and surrounding structures were obtained without intravenous contrast. COMPARISON:  Head CT February 16, 2012 FINDINGS: Brain: No acute infarction, hemorrhage, hydrocephalus, extra-axial collection or mass lesion. Scattered foci of T2 hyperintensity are seen within the white matter of the cerebral hemispheres and within the pons, nonspecific, most likely related to chronic small vessel ischemia. Remote lacunar infarct in the bilateral cerebellar hemispheres and bilateral corona radiata. Mild parenchymal volume loss. Vascular: Normal flow voids. Skull and upper cervical spine: Normal marrow signal. Sinuses/Orbits: Mild mucosal thickening of the left maxillary sinus. The orbits  are maintained. Other: Mild left mastoid effusion. IMPRESSION: 1. No acute intracranial abnormality. 2. Moderate chronic microvascular ischemic changes of the white matter mild parenchymal volume loss. 3. 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 Electronically Signed   By: Pedro Earls M.D.   On: 05/08/2021 15:35    Assessment & Plan:   Charles Robles was seen today for hypertension, anemia and hyperlipidemia.  Diagnoses and all orders for this visit:  Deficiency anemia- Will screen for vit deficiencies. -     CBC with Differential/Platelet; Future -     Vitamin B12; Future -     Folate; Future -     Ferritin; Future -     Vitamin B1; Future -     Iron; Future -  Reticulocytes; Future -     Reticulocytes -     Iron -     Vitamin B1 -     Ferritin -     Folate -     Vitamin B12 -     CBC with Differential/Platelet  Chronic hyperglycemia- His A1C is normal. -     Hemoglobin A1c; Future -     Hemoglobin A1c  Hypertriglyceridemia- Improvement noted -     Triglycerides; Future -     Triglycerides  Primary hypertension- Will start an ARB. -     olmesartan (BENICAR) 20 MG tablet; Take 1 tablet (20 mg total) by mouth daily.  Vitamin B12 deficiency anemia due to intrinsic factor deficiency- He agrees to start parenteral B12 replacement tx.  I am having Charles Robles. Charles Robles or Dr. Roselie Awkward" start on olmesartan. I am also having him maintain his fish oil-omega-3 fatty acids, cetirizine, aspirin, amoxicillin, nystatin cream, Glucosamine-Chondroit-Vit C-Mn (GLUCOSAMINE 1500 COMPLEX PO), sildenafil, rosuvastatin, esomeprazole, and sucralfate.  Meds ordered this encounter  Medications   olmesartan (BENICAR) 20 MG tablet    Sig: Take 1 tablet (20 mg total) by mouth daily.    Dispense:  90 tablet    Refill:  1      Follow-up: Return in about 6 months (around 11/15/2021).  Scarlette Calico, MD

## 2021-05-16 NOTE — Patient Instructions (Signed)

## 2021-05-17 ENCOUNTER — Ambulatory Visit (INDEPENDENT_AMBULATORY_CARE_PROVIDER_SITE_OTHER): Payer: Medicare Other

## 2021-05-17 DIAGNOSIS — D510 Vitamin B12 deficiency anemia due to intrinsic factor deficiency: Secondary | ICD-10-CM

## 2021-05-17 MED ORDER — CYANOCOBALAMIN 1000 MCG/ML IJ SOLN
1000.0000 ug | Freq: Once | INTRAMUSCULAR | Status: AC
  Administered 2021-05-17: 1000 ug via INTRAMUSCULAR

## 2021-05-17 NOTE — Progress Notes (Signed)
B12 given  Please sign  

## 2021-05-22 ENCOUNTER — Encounter: Payer: Self-pay | Admitting: Internal Medicine

## 2021-05-22 LAB — RETICULOCYTES
ABS Retic: 66880 cells/uL (ref 25000–90000)
Retic Ct Pct: 1.6 %

## 2021-05-22 LAB — VITAMIN B1: Vitamin B1 (Thiamine): 20 nmol/L (ref 8–30)

## 2021-05-24 ENCOUNTER — Other Ambulatory Visit: Payer: Self-pay

## 2021-05-24 ENCOUNTER — Ambulatory Visit (INDEPENDENT_AMBULATORY_CARE_PROVIDER_SITE_OTHER): Payer: Medicare Other

## 2021-05-24 ENCOUNTER — Ambulatory Visit: Payer: Medicare Other

## 2021-05-24 DIAGNOSIS — D510 Vitamin B12 deficiency anemia due to intrinsic factor deficiency: Secondary | ICD-10-CM

## 2021-05-24 MED ORDER — CYANOCOBALAMIN 1000 MCG/ML IJ SOLN
1000.0000 ug | Freq: Once | INTRAMUSCULAR | Status: AC
  Administered 2021-05-24: 1000 ug via INTRAMUSCULAR

## 2021-05-24 NOTE — Progress Notes (Signed)
Pt here for weekly B12 injection per Dr. Ronnald Ramp  B12 1045mcg given IM, and pt tolerated injection well.  Next B12 injection scheduled for 05/31/21

## 2021-05-28 ENCOUNTER — Other Ambulatory Visit: Payer: Self-pay | Admitting: Internal Medicine

## 2021-05-28 ENCOUNTER — Telehealth: Payer: Self-pay | Admitting: Internal Medicine

## 2021-05-28 DIAGNOSIS — E785 Hyperlipidemia, unspecified: Secondary | ICD-10-CM

## 2021-05-28 MED ORDER — ROSUVASTATIN CALCIUM 10 MG PO TABS: 10.0000 mg | ORAL_TABLET | Freq: Every day | ORAL | 1 refills | Status: DC

## 2021-05-28 NOTE — Telephone Encounter (Signed)
    Patient calling to request rosuvastatin (CRESTOR) 10 MG tablet  Pharmacy [facility] [place], [place] (Ph: [phone])

## 2021-05-31 ENCOUNTER — Ambulatory Visit: Payer: Medicare Other

## 2021-06-03 ENCOUNTER — Other Ambulatory Visit: Payer: Self-pay

## 2021-06-03 ENCOUNTER — Ambulatory Visit (INDEPENDENT_AMBULATORY_CARE_PROVIDER_SITE_OTHER): Payer: Medicare Other

## 2021-06-03 DIAGNOSIS — D510 Vitamin B12 deficiency anemia due to intrinsic factor deficiency: Secondary | ICD-10-CM | POA: Diagnosis not present

## 2021-06-03 MED ORDER — CYANOCOBALAMIN 1000 MCG/ML IJ SOLN
1000.0000 ug | Freq: Once | INTRAMUSCULAR | Status: AC
  Administered 2021-06-03: 1000 ug via INTRAMUSCULAR

## 2021-06-03 NOTE — Progress Notes (Signed)
Pt here for weekly B12 injection #3 of 3 per Dr Ronnald Ramp.  B12 1057mcg given IM right deltoid and pt tolerated injection well.  Next B12 injection scheduled for 07/04/21.

## 2021-06-10 ENCOUNTER — Encounter: Payer: Self-pay | Admitting: Dermatology

## 2021-06-10 ENCOUNTER — Ambulatory Visit (INDEPENDENT_AMBULATORY_CARE_PROVIDER_SITE_OTHER): Payer: Medicare Other | Admitting: Dermatology

## 2021-06-10 ENCOUNTER — Other Ambulatory Visit: Payer: Self-pay

## 2021-06-10 DIAGNOSIS — C44311 Basal cell carcinoma of skin of nose: Secondary | ICD-10-CM | POA: Diagnosis not present

## 2021-06-10 DIAGNOSIS — I6381 Other cerebral infarction due to occlusion or stenosis of small artery: Secondary | ICD-10-CM | POA: Diagnosis not present

## 2021-06-10 DIAGNOSIS — D485 Neoplasm of uncertain behavior of skin: Secondary | ICD-10-CM

## 2021-06-10 DIAGNOSIS — C44321 Squamous cell carcinoma of skin of nose: Secondary | ICD-10-CM | POA: Diagnosis not present

## 2021-06-10 DIAGNOSIS — Z1283 Encounter for screening for malignant neoplasm of skin: Secondary | ICD-10-CM | POA: Diagnosis not present

## 2021-06-10 DIAGNOSIS — Z85820 Personal history of malignant melanoma of skin: Secondary | ICD-10-CM

## 2021-06-10 DIAGNOSIS — L821 Other seborrheic keratosis: Secondary | ICD-10-CM | POA: Diagnosis not present

## 2021-06-10 DIAGNOSIS — Z85828 Personal history of other malignant neoplasm of skin: Secondary | ICD-10-CM | POA: Diagnosis not present

## 2021-06-10 DIAGNOSIS — D044 Carcinoma in situ of skin of scalp and neck: Secondary | ICD-10-CM

## 2021-06-10 DIAGNOSIS — D1801 Hemangioma of skin and subcutaneous tissue: Secondary | ICD-10-CM | POA: Diagnosis not present

## 2021-06-10 NOTE — Patient Instructions (Signed)

## 2021-06-11 DIAGNOSIS — H52203 Unspecified astigmatism, bilateral: Secondary | ICD-10-CM | POA: Diagnosis not present

## 2021-06-11 DIAGNOSIS — H18603 Keratoconus, unspecified, bilateral: Secondary | ICD-10-CM | POA: Diagnosis not present

## 2021-06-11 DIAGNOSIS — H2513 Age-related nuclear cataract, bilateral: Secondary | ICD-10-CM | POA: Diagnosis not present

## 2021-06-13 ENCOUNTER — Other Ambulatory Visit: Payer: Self-pay | Admitting: Emergency Medicine

## 2021-06-13 DIAGNOSIS — I10 Essential (primary) hypertension: Secondary | ICD-10-CM

## 2021-06-15 ENCOUNTER — Encounter: Payer: Self-pay | Admitting: Dermatology

## 2021-06-15 NOTE — Progress Notes (Signed)
   Follow-Up Visit   Subjective  Charles Robles or Dr. Cordy Box is a 75 y.o. 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 Right top scalp horn like previous biopsy AK and it was treated also check lesion on the chest to be checked today. Last ov 07-01-16).  General skin examination and patient with history of melanoma and nonmelanoma skin cancers. Location:  Duration:  Quality:  Associated Signs/Symptoms: Modifying Factors:  Severity:  Timing: Context:   Objective  Well appearing patient in no apparent distress; mood and affect are within normal limits. Generalized, Mid Back Patient has history of mm no path found in chart and bcc on left back 2017. Waist up exam today.  No atypical pigmented lesions.  2 possible new nonmelanoma skin cancers on nose and scalp will be biopsied.  Right Upper Arm - Anterior Large 5 mm cherry angioma no change stable.  Typical dermoscopy.  Left Abdomen (side) - Upper Large 8 mm brown textured flattopped papule mid abdomen stable, and many more smaller.  With typical dermoscopy.  right Frontal Scalp Waxy 1.8 cm crust compatible with superficial carcinoma  Left Nasal Sidewall Pearly 8 mm focally eroded papule compatible with BCC    All skin waist up examined.   Assessment & Plan    Screening exam for skin cancer (2) Mid Back; Generalized  Annual skin examination.  Continued ultraviolet protection.  Cherry angioma Right Upper Arm - Anterior  No intervention indicated.  Seborrheic keratosis Left Abdomen (side) - Upper  Leave if stable.  Neoplasm of uncertain behavior of skin (2) right Frontal Scalp  Skin / nail biopsy Type of biopsy: tangential   Informed consent: discussed and consent obtained   Timeout: patient name, date of birth, surgical site, and procedure verified   Anesthesia: the lesion was anesthetized in a  standard fashion   Anesthetic:  1% lidocaine w/ epinephrine 1-100,000 local infiltration Instrument used: flexible razor blade   Hemostasis achieved with: ferric subsulfate   Outcome: patient tolerated procedure well   Post-procedure details: sterile dressing applied and wound care instructions given   Dressing type: bandage and petrolatum    Specimen 1 - Surgical pathology Differential Diagnosis: sk bcc scc  Check Margins: No  Left Nasal Sidewall  Skin / nail biopsy Type of biopsy: tangential   Informed consent: discussed and consent obtained   Timeout: patient name, date of birth, surgical site, and procedure verified   Anesthesia: the lesion was anesthetized in a standard fashion   Anesthetic:  1% lidocaine w/ epinephrine 1-100,000 local infiltration Instrument used: flexible razor blade   Hemostasis achieved with: ferric subsulfate   Outcome: patient tolerated procedure well   Post-procedure details: sterile dressing applied and wound care instructions given   Dressing type: bandage and petrolatum    Specimen 2 - Surgical pathology Differential Diagnosis: bcc scc  Check Margins: No      I, Lavonna Monarch, MD, have reviewed all documentation for this visit.  The documentation on 06/15/21 for the exam, diagnosis, procedures, and orders are all accurate and complete.

## 2021-06-17 ENCOUNTER — Telehealth: Payer: Self-pay

## 2021-06-17 NOTE — Telephone Encounter (Signed)
-----   Message from Lavonna Monarch, MD sent at 06/14/2021  5:22 PM EDT ----- Please schedule surgery with me for scalp and Mohs surgery for nerves. ----- Message ----- From: Interface, [facility] Sent: 06/14/2021   4:56 PM EDT To: Lavonna Monarch, MD

## 2021-06-17 NOTE — Telephone Encounter (Signed)
Phone call to patient with his pathology results. Voicemail left for patient to give the office a call back.  ?

## 2021-06-17 NOTE — Telephone Encounter (Signed)
Phone call from patient returning our call. Pathology results given to patient.  

## 2021-07-04 ENCOUNTER — Ambulatory Visit (INDEPENDENT_AMBULATORY_CARE_PROVIDER_SITE_OTHER): Payer: Medicare Other

## 2021-07-04 ENCOUNTER — Other Ambulatory Visit: Payer: Self-pay

## 2021-07-04 DIAGNOSIS — D510 Vitamin B12 deficiency anemia due to intrinsic factor deficiency: Secondary | ICD-10-CM | POA: Diagnosis not present

## 2021-07-04 MED ORDER — CYANOCOBALAMIN 1000 MCG/ML IJ SOLN
1000.0000 ug | Freq: Once | INTRAMUSCULAR | Status: AC
  Administered 2021-07-04: 1000 ug via INTRAMUSCULAR

## 2021-07-04 NOTE — Progress Notes (Signed)
Vitamin B12  given w/o any complications. 

## 2021-08-07 ENCOUNTER — Ambulatory Visit: Payer: Medicare Other

## 2021-08-08 ENCOUNTER — Ambulatory Visit (INDEPENDENT_AMBULATORY_CARE_PROVIDER_SITE_OTHER): Payer: Medicare Other

## 2021-08-08 ENCOUNTER — Other Ambulatory Visit: Payer: Self-pay

## 2021-08-08 DIAGNOSIS — D510 Vitamin B12 deficiency anemia due to intrinsic factor deficiency: Secondary | ICD-10-CM | POA: Diagnosis not present

## 2021-08-08 MED ORDER — CYANOCOBALAMIN 1000 MCG/ML IJ SOLN
1000.0000 ug | Freq: Once | INTRAMUSCULAR | Status: AC
  Administered 2021-08-08: 1000 ug via INTRAMUSCULAR

## 2021-08-08 NOTE — Progress Notes (Signed)
Vitamin B12 injection given today, patient tolerated well.

## 2021-08-13 ENCOUNTER — Telehealth: Payer: Self-pay | Admitting: Internal Medicine

## 2021-08-13 NOTE — Telephone Encounter (Signed)
Left message for patient to call me back at [phone] to schedule Medicare Annual Wellness Visit   Last AWV  05/10/20  Please schedule at anytime with [facility] if patient calls the office back.    66 Minutes appointment   Any questions, please call me at [phone]

## 2021-08-21 ENCOUNTER — Ambulatory Visit: Payer: Medicare Other

## 2021-08-21 ENCOUNTER — Other Ambulatory Visit: Payer: Self-pay

## 2021-08-27 ENCOUNTER — Other Ambulatory Visit: Payer: Self-pay | Admitting: Internal Medicine

## 2021-08-27 ENCOUNTER — Ambulatory Visit (INDEPENDENT_AMBULATORY_CARE_PROVIDER_SITE_OTHER): Payer: Medicare Other

## 2021-08-27 ENCOUNTER — Other Ambulatory Visit: Payer: Self-pay

## 2021-08-27 VITALS — BP 130/78 | HR 80 | Temp 98.4°F | Ht 69.0 in | Wt 171.4 lb

## 2021-08-27 DIAGNOSIS — E785 Hyperlipidemia, unspecified: Secondary | ICD-10-CM

## 2021-08-27 DIAGNOSIS — Z0000 Encounter for general adult medical examination without abnormal findings: Secondary | ICD-10-CM | POA: Diagnosis not present

## 2021-08-27 DIAGNOSIS — K920 Hematemesis: Secondary | ICD-10-CM

## 2021-08-27 DIAGNOSIS — K2101 Gastro-esophageal reflux disease with esophagitis, with bleeding: Secondary | ICD-10-CM

## 2021-08-27 MED ORDER — SUCRALFATE 1 G PO TABS: 1.0000 g | ORAL_TABLET | Freq: Three times a day (TID) | ORAL | 0 refills | Status: DC

## 2021-08-27 MED ORDER — ROSUVASTATIN CALCIUM 10 MG PO TABS: 10.0000 mg | ORAL_TABLET | Freq: Every day | ORAL | 0 refills | Status: DC

## 2021-08-27 NOTE — Progress Notes (Signed)
Subjective:   Charles Robles is a 75 y.o. male who presents for Medicare Annual/Subsequent preventive examination.  Review of Systems     Cardiac Risk Factors include: advanced age (>72men, >81 women);dyslipidemia;family history of premature cardiovascular disease;hypertension;male gender     Objective:    Today's Vitals   08/27/21 1356  BP: 130/78  Pulse: 80  Temp: 98.4 F (36.9 C)  SpO2: 97%  Weight: 171 lb 6.4 oz (77.7 kg)  Height: 5\' 9"  (1.753 m)  PainSc: 0-No pain   Body mass index is 25.31 kg/m.  Advanced Directives 08/27/2021 05/10/2020 07/12/2019 07/08/2019 06/22/2019 06/23/2018 10/06/2016  Does Patient Have a Medical Advance Directive? Yes No Yes Yes Yes No No  Type of Advance Directive Living will;Healthcare Power of [place];Living will [place];Living will - -  Does patient want to make changes to medical advance directive? No - Patient declined No - Patient declined No - Patient declined - - - -  Copy of [place] in Chart? No - copy requested - No - copy requested No - copy requested No - copy requested - -  Would patient like information on creating a medical advance directive? - No - Patient declined - - - Yes (MAU/Ambulatory/Procedural Areas - Information given) No - patient declined information    Current Medications (verified) Outpatient Encounter Medications as of 08/27/2021  Medication Sig   cetirizine (ZYRTEC) 10 MG tablet Take 10 mg by mouth daily as needed for allergies.   olmesartan (BENICAR) 20 MG tablet Take 1 tablet (20 mg total) by mouth daily.   rosuvastatin (CRESTOR) 10 MG tablet Take 1 tablet (10 mg total) by mouth daily.   sildenafil (VIAGRA) 100 MG tablet Take 1 tablet (100 mg total) by mouth daily as needed for erectile dysfunction.   sucralfate (CARAFATE) 1 g tablet Take 1 tablet (1 g total) by mouth 4 (four) times daily -  with meals and  at bedtime.   amoxicillin (AMOXIL) 500 MG tablet Take 4 tablets (2,000 mg total) by mouth as directed. Take 4 tablets 1 hour prior to dental work, including cleanings. (Patient not taking: Reported on 06/10/2021)   esomeprazole (NEXIUM) 40 MG capsule Take 1 capsule (40 mg total) by mouth daily. (Patient not taking: Reported on 06/10/2021)   fish oil-omega-3 fatty acids 1000 MG capsule Take 1 g by mouth daily.  (Patient not taking: Reported on 06/10/2021)   Glucosamine-Chondroit-Vit C-Mn (GLUCOSAMINE 1500 COMPLEX PO) Take by mouth. (Patient not taking: Reported on 06/10/2021)   nystatin cream (MYCOSTATIN) Apply 1 application topically 2 (two) times daily. (Patient not taking: Reported on 06/10/2021)   No facility-administered encounter medications on file as of 08/27/2021.    Allergies (verified) Patient has no known allergies.   History: Past Medical History:  Diagnosis Date   Abnormal large bowel motility    Arthritis    Basal cell carcinoma 2017   left back bcc tx cx3 54fu   Hearing loss    Heart murmur    Hyperlipidemia    Hypertension    Melanoma ([place])    melanoma per patient years ago   Other fatigue    Pulmonary nodule    a. noted on pre TAVR CT, will need 1 year follow up given smoking history    S/P TAVR (transcatheter aortic valve replacement)    a. s/p TAVR with a 26 mm Edwards sapein 3 Ultra valve via the TF approach  on 07/12/19   Severe aortic stenosis    a. s/p TAVR with a 26 mm Edwards sapein 3 Ultra valve via the TF approach on 07/12/19   Stool incontinence    Urgency of urination    Vision loss of right eye    Past Surgical History:  Procedure Laterality Date   COLONOSCOPY  2017   EYE SURGERY  1981    Right eye- corneal transplant   HERNIA REPAIR     x3   POLYPECTOMY     RIGHT/LEFT HEART CATH AND CORONARY ANGIOGRAPHY N/A 06/23/2018   Procedure: RIGHT/LEFT HEART CATH AND CORONARY ANGIOGRAPHY;  Surgeon: Burnell Blanks, MD;  Location: [facility];   Service: Cardiovascular;  Laterality: N/A;   TEE WITHOUT CARDIOVERSION N/A 07/12/2019   Procedure: TRANSESOPHAGEAL ECHOCARDIOGRAM (TEE);  Surgeon: Burnell Blanks, MD;  Location: [facility];  Service: Open Heart Surgery;  Laterality: N/A;   TONSILLECTOMY     TRANSCATHETER AORTIC VALVE REPLACEMENT, TRANSFEMORAL N/A 07/12/2019   Procedure: TRANSCATHETER AORTIC VALVE REPLACEMENT, TRANSFEMORAL;  Surgeon: Burnell Blanks, MD;  Location: [facility];  Service: Open Heart Surgery;  Laterality: N/A;   Family History  Problem Relation Age of Onset   Heart disease Father        MI   Hyperlipidemia Father    Hypertension Mother    Alcohol abuse Mother    Hyperlipidemia Brother    Hypertension Brother    Colon cancer Neg Hx    Colon polyps Neg Hx    Rectal cancer Neg Hx    Stomach cancer Neg Hx    Esophageal cancer Neg Hx    Social History   Socioeconomic History   Marital status: Divorced    Spouse name: Not on file   Number of children: 0   Years of education: Not on file   Highest education level: Not on file  Occupational History   Occupation: Retired-Psychologist  Tobacco Use   Smoking status: Some Days    Types: Cigars   Smokeless tobacco: Never  Vaping Use   Vaping Use: Never used  Substance and Sexual Activity   Alcohol use: Yes    Alcohol/week: 32.0 standard drinks    Types: 30 Cans of beer, 2 Glasses of wine per week   Drug use: Yes    Types: Marijuana    Comment: 1-2 week   Sexual activity: Never  Other Topics Concern   Not on file  Social History Narrative   Not on file   Social Determinants of Health   Financial Resource Strain: Low Risk    Difficulty of Paying Living Expenses: Not hard at all  Food Insecurity: No Food Insecurity   Worried About Charity fundraiser in the Last Year: Never true   Ran Out of Food in the Last Year: Never true  Transportation Needs: No Transportation Needs   Lack of Transportation (Medical): No   Lack of Transportation  (Non-Medical): No  Physical Activity: Sufficiently Active   Days of Exercise per Week: 5 days   Minutes of Exercise per Session: 30 min  Stress: No Stress Concern Present   Feeling of Stress : Not at all  Social Connections: Moderately Integrated   Frequency of Communication with Friends and Family: More than three times a week   Frequency of Social Gatherings with Friends and Family: More than three times a week   Attends Religious Services: 1 to 4 times per year   Active Member of [employer] or Organizations:  Yes   Attends Club or Organization Meetings: 1 to 4 times per year   Marital Status: Never married    Tobacco Counseling Ready to quit: Not Answered Counseling given: Not Answered   Clinical Intake:  Pre-visit preparation completed: Yes  Pain : No/denies pain Pain Score: 0-No pain     BMI - recorded: 25.31 Nutritional Status: BMI 25 -29 Overweight Nutritional Risks: None Diabetes: No  How often do you need to have someone help you when you read instructions, pamphlets, or other written materials from your doctor or pharmacy?: 1 - Never What is the last grade level you completed in school?: Ph.D in Pyschology  Diabetic? no  Interpreter Needed?: No  Information entered by :: Lisette Abu, LPN   Activities of Daily Living In your present state of health, do you have any difficulty performing the following activities: 08/27/2021 05/16/2021  Hearing? N N  Vision? N N  Difficulty concentrating or making decisions? N N  Walking or climbing stairs? N N  Dressing or bathing? N N  Doing errands, shopping? N N  Preparing Food and eating ? N -  Using the Toilet? N -  In the past six months, have you accidently leaked urine? N -  Do you have problems with loss of bowel control? N -  Managing your Medications? N -  Managing your Finances? N -  Housekeeping or managing your Housekeeping? N -  Some recent data might be hidden    Patient Care Team: Janith Lima,  MD as PCP - General (Internal Medicine) Jerline Pain, MD as PCP - Cardiology (Cardiology) Luberta Mutter, MD as Consulting Physician (Ophthalmology)  Indicate any recent Medical Services you may have received from other than [facility] providers in the past year (date may be approximate).     Assessment:   This is a routine wellness examination for [place].  Hearing/Vision screen Hearing Screening - Comments:: Patient denied any hearing difficulty.   No hearing aids.  Vision Screening - Comments:: Patient wears corrective glasses/contacts.  Eye exam done annually by: Luberta Mutter, MD.   Dietary issues and exercise activities discussed: Current Exercise Habits: Home exercise routine, Type of exercise: walking, Time (Minutes): 30, Frequency (Times/Week): 5, Weekly Exercise (Minutes/Week): 150, Intensity: Moderate, Exercise limited by: None identified   Goals Addressed   None   Depression Screen PHQ 2/9 Scores 08/27/2021 09/12/2020 05/10/2020 03/27/2020 02/17/2019 03/25/2017 03/18/2017  PHQ - 2 Score 0 0 0 0 0 0 0  PHQ- 9 Score - - - - - - -  Exception Documentation - - - - - - -    Fall Risk Fall Risk  08/27/2021 09/12/2020 05/10/2020 03/27/2020 02/17/2019  Falls in the past year? 0 0 0 1 0  Number falls in past yr: 0 - 0 0 -  Comment - - walking down back stairs slid down stairs 5 steps. - -  Injury with Fall? 0 - 1 0 -  Risk for fall due to : No Fall Risks - - - -  Risk for fall due to: Comment - - - - -  Follow up Falls evaluation completed Falls evaluation completed Falls evaluation completed;Education provided Falls evaluation completed -    FALL RISK PREVENTION PERTAINING TO THE HOME:  Any stairs in or around the home? Yes  If so, are there any without handrails? No  Home free of loose throw rugs in walkways, pet beds, electrical cords, etc? Yes  Adequate lighting in your home to reduce risk  of falls? Yes   ASSISTIVE DEVICES UTILIZED TO PREVENT FALLS:  Life alert? No   Use of a cane, walker or w/c? No  Grab bars in the bathroom? No  Shower chair or bench in shower? No  Elevated toilet seat or a handicapped toilet? No   TIMED UP AND GO:  Was the test performed? Yes .  Length of time to ambulate 10 feet: 6 sec.   Gait steady and fast without use of assistive device  Cognitive Function: Normal cognitive status assessed by direct observation by this Nurse Health Advisor. No abnormalities found.       6CIT Screen 05/10/2020 02/17/2019  What Year? 0 points 0 points  What month? 0 points 0 points  What time? 0 points 0 points  Count back from 20 0 points 0 points  Months in reverse 0 points 0 points  Repeat phrase 2 points 0 points  Total Score 2 0    Immunizations Immunization History  Administered Date(s) Administered   Fluad Quad(high Dose 65+) 09/12/2020   PFIZER(Purple Top)SARS-COV-2 Vaccination 01/15/2020, 02/08/2020   Pneumococcal Polysaccharide-23 04/24/2021    TDAP status: Due, Education has been provided regarding the importance of this vaccine. Advised may receive this vaccine at local pharmacy or Health Dept. Aware to provide a copy of the vaccination record if obtained from local pharmacy or Health Dept. Verbalized acceptance and understanding.  Flu Vaccine status: Declined, Education has been provided regarding the importance of this vaccine but patient still declined. Advised may receive this vaccine at local pharmacy or Health Dept. Aware to provide a copy of the vaccination record if obtained from local pharmacy or Health Dept. Verbalized acceptance and understanding.  Pneumococcal vaccine status: Up to date  Covid-19 vaccine status: Completed vaccines  Qualifies for Shingles Vaccine? Yes   Zostavax completed No   Shingrix Completed?: No.    Education has been provided regarding the importance of this vaccine. Patient has been advised to call insurance company to determine out of pocket expense if they have not yet received this  vaccine. Advised may also receive vaccine at local pharmacy or Health Dept. Verbalized acceptance and understanding.  Screening Tests Health Maintenance  Topic Date Due   Zoster Vaccines- Shingrix (1 of 2) Never done   COVID-19 Vaccine (3 - Pfizer risk series) 03/07/2020   INFLUENZA VACCINE  06/17/2021   TETANUS/TDAP  09/12/2021 (Originally 05/29/1965)   COLONOSCOPY (Pts 45-75yrs Insurance coverage will need to be confirmed)  01/19/2023   Hepatitis C Screening  Completed   HPV VACCINES  Aged Out    Health Maintenance  Health Maintenance Due  Topic Date Due   Zoster Vaccines- Shingrix (1 of 2) Never done   COVID-19 Vaccine (3 - Pfizer risk series) 03/07/2020   INFLUENZA VACCINE  06/17/2021    Colorectal cancer screening: Type of screening: Colonoscopy. Completed 01/19/2020. Repeat every 3 years  Lung Cancer Screening: (Low Dose CT Chest recommended if Age 33-80 years, 30 pack-year currently smoking OR have quit w/in 15years.) does not qualify.   Lung Cancer Screening Referral: no  Additional Screening:  Hepatitis C Screening: does qualify; Completed yes  Vision Screening: Recommended annual ophthalmology exams for early detection of glaucoma and other disorders of the eye. Is the patient up to date with their annual eye exam?  Yes  Who is the provider or what is the name of the office in which the patient attends annual eye exams? Luberta Mutter, MD. If pt is not established with a provider,  would they like to be referred to a provider to establish care? No .   Dental Screening: Recommended annual dental exams for proper oral hygiene  Community Resource Referral / Chronic Care Management: CRR required this visit?  No   CCM required this visit?  No      Plan:     I have personally reviewed and noted the following in the patient's chart:   Medical and social history Use of alcohol, tobacco or illicit drugs  Current medications and supplements including opioid  prescriptions. Patient is not currently taking opioid prescriptions. Functional ability and status Nutritional status Physical activity Advanced directives List of other physicians Hospitalizations, surgeries, and ER visits in previous 12 months Vitals Screenings to include cognitive, depression, and falls Referrals and appointments  In addition, I have reviewed and discussed with patient certain preventive protocols, quality metrics, and best practice recommendations. A written personalized care plan for preventive services as well as general preventive health recommendations were provided to patient.     Sheral Flow, LPN   [id number]   Nurse Notes:  Hearing Screening - Comments:: Patient denied any hearing difficulty.   No hearing aids.  Vision Screening - Comments:: Patient wears corrective glasses/contacts.  Eye exam done annually by: Luberta Mutter, MD.

## 2021-08-27 NOTE — Patient Instructions (Signed)
Charles Robles , Thank you for taking time to come for your Medicare Wellness Visit. I appreciate your ongoing commitment to your health goals. Please review the following plan we discussed and let me know if I can assist you in the future.   Screening recommendations/referrals: Colonoscopy: 01/19/2020; due every 3 years Recommended yearly ophthalmology/optometry visit for glaucoma screening and checkup Recommended yearly dental visit for hygiene and checkup  Vaccinations: Influenza vaccine: declined Pneumococcal vaccine: 04/24/2021; need Prevnar13 (due 04/24/2022) Tdap vaccine: never done Shingles vaccine: never done   Covid-19: 01/15/2020, 02/08/2020, 05/29/2021  Advanced directives: Please bring a copy of your health care power of attorney and living will to the office at your convenience.  Conditions/risks identified: Yes; Client understands the importance of follow-up with providers by attending scheduled visits and discussed goals to eat healthier, increase physical activity, exercise the brain, socialize more, get enough sleep and make time for laughter.  Next appointment: Please schedule your next Medicare Wellness Visit with your Nurse Health Advisor in 1 year by calling [phone].  Preventive Care 31 Years and Older, Male Preventive care refers to lifestyle choices and visits with your health care provider that can promote health and wellness. What does preventive care include? A yearly physical exam. This is also called an annual well check. Dental exams once or twice a year. Routine eye exams. Ask your health care provider how often you should have your eyes checked. Personal lifestyle choices, including: Daily care of your teeth and gums. Regular physical activity. Eating a healthy diet. Avoiding tobacco and drug use. Limiting alcohol use. Practicing safe sex. Taking low doses of aspirin every day. Taking vitamin and mineral supplements as recommended by your health care  provider. What happens during an annual well check? The services and screenings done by your health care provider during your annual well check will depend on your age, overall health, lifestyle risk factors, and family history of disease. Counseling  Your health care provider may ask you questions about your: Alcohol use. Tobacco use. Drug use. Emotional well-being. Home and relationship well-being. Sexual activity. Eating habits. History of falls. Memory and ability to understand (cognition). Work and work Statistician. Screening  You may have the following tests or measurements: Height, weight, and BMI. Blood pressure. Lipid and cholesterol levels. These may be checked every 5 years, or more frequently if you are over 85 years old. Skin check. Lung cancer screening. You may have this screening every year starting at age 84 if you have a 30-pack-year history of smoking and currently smoke or have quit within the past 15 years. Fecal occult blood test (FOBT) of the stool. You may have this test every year starting at age 81. Flexible sigmoidoscopy or colonoscopy. You may have a sigmoidoscopy every 5 years or a colonoscopy every 10 years starting at age 79. Prostate cancer screening. Recommendations will vary depending on your family history and other risks. Hepatitis C blood test. Hepatitis B blood test. Sexually transmitted disease (STD) testing. Diabetes screening. This is done by checking your blood sugar (glucose) after you have not eaten for a while (fasting). You may have this done every 1-3 years. Abdominal aortic aneurysm (AAA) screening. You may need this if you are a current or former smoker. Osteoporosis. You may be screened starting at age 98 if you are at high risk. Talk with your health care provider about your test results, treatment options, and if necessary, the need for more tests. Vaccines  Your health care provider may recommend  certain vaccines, such  as: Influenza vaccine. This is recommended every year. Tetanus, diphtheria, and acellular pertussis (Tdap, Td) vaccine. You may need a Td booster every 10 years. Zoster vaccine. You may need this after age 42. Pneumococcal 13-valent conjugate (PCV13) vaccine. One dose is recommended after age 65. Pneumococcal polysaccharide (PPSV23) vaccine. One dose is recommended after age 41. Talk to your health care provider about which screenings and vaccines you need and how often you need them. This information is not intended to replace advice given to you by your health care provider. Make sure you discuss any questions you have with your health care provider. Document Released: 11/30/2015 Document Revised: 07/23/2016 Document Reviewed: 09/04/2015 Elsevier Interactive Patient Education  2017 [place] Prevention in the Home Falls can cause injuries. They can happen to people of all ages. There are many things you can do to make your home safe and to help prevent falls. What can I do on the outside of my home? Regularly fix the edges of walkways and driveways and fix any cracks. Remove anything that might make you trip as you walk through a door, such as a raised step or threshold. Trim any bushes or trees on the path to your home. Use bright outdoor lighting. Clear any walking paths of anything that might make someone trip, such as rocks or tools. Regularly check to see if handrails are loose or broken. Make sure that both sides of any steps have handrails. Any raised decks and porches should have guardrails on the edges. Have any leaves, snow, or ice cleared regularly. Use sand or salt on walking paths during winter. Clean up any spills in your garage right away. This includes oil or grease spills. What can I do in the bathroom? Use night lights. Install grab bars by the toilet and in the tub and shower. Do not use towel bars as grab bars. Use non-skid mats or decals in the tub or  shower. If you need to sit down in the shower, use a plastic, non-slip stool. Keep the floor dry. Clean up any water that spills on the floor as soon as it happens. Remove soap buildup in the tub or shower regularly. Attach bath mats securely with double-sided non-slip rug tape. Do not have throw rugs and other things on the floor that can make you trip. What can I do in the bedroom? Use night lights. Make sure that you have a light by your bed that is easy to reach. Do not use any sheets or blankets that are too big for your bed. They should not hang down onto the floor. Have a firm chair that has side arms. You can use this for support while you get dressed. Do not have throw rugs and other things on the floor that can make you trip. What can I do in the kitchen? Clean up any spills right away. Avoid walking on wet floors. Keep items that you use a lot in easy-to-reach places. If you need to reach something above you, use a strong step stool that has a grab bar. Keep electrical cords out of the way. Do not use floor polish or wax that makes floors slippery. If you must use wax, use non-skid floor wax. Do not have throw rugs and other things on the floor that can make you trip. What can I do with my stairs? Do not leave any items on the stairs. Make sure that there are handrails on both sides of the  stairs and use them. Fix handrails that are broken or loose. Make sure that handrails are as long as the stairways. Check any carpeting to make sure that it is firmly attached to the stairs. Fix any carpet that is loose or worn. Avoid having throw rugs at the top or bottom of the stairs. If you do have throw rugs, attach them to the floor with carpet tape. Make sure that you have a light switch at the top of the stairs and the bottom of the stairs. If you do not have them, ask someone to add them for you. What else can I do to help prevent falls? Wear shoes that: Do not have high heels. Have  rubber bottoms. Are comfortable and fit you well. Are closed at the toe. Do not wear sandals. If you use a stepladder: Make sure that it is fully opened. Do not climb a closed stepladder. Make sure that both sides of the stepladder are locked into place. Ask someone to hold it for you, if possible. Clearly mark and make sure that you can see: Any grab bars or handrails. First and last steps. Where the edge of each step is. Use tools that help you move around (mobility aids) if they are needed. These include: Canes. Walkers. Scooters. Crutches. Turn on the lights when you go into a dark area. Replace any light bulbs as soon as they burn out. Set up your furniture so you have a clear path. Avoid moving your furniture around. If any of your floors are uneven, fix them. If there are any pets around you, be aware of where they are. Review your medicines with your doctor. Some medicines can make you feel dizzy. This can increase your chance of falling. Ask your doctor what other things that you can do to help prevent falls. This information is not intended to replace advice given to you by your health care provider. Make sure you discuss any questions you have with your health care provider. Document Released: 08/30/2009 Document Revised: 04/10/2016 Document Reviewed: 12/08/2014 Elsevier Interactive Patient Education  2017 [employer].

## 2021-09-05 DIAGNOSIS — C44391 Other specified malignant neoplasm of skin of nose: Secondary | ICD-10-CM | POA: Diagnosis not present

## 2021-09-09 ENCOUNTER — Ambulatory Visit (INDEPENDENT_AMBULATORY_CARE_PROVIDER_SITE_OTHER): Payer: Medicare Other

## 2021-09-09 ENCOUNTER — Other Ambulatory Visit: Payer: Self-pay

## 2021-09-09 DIAGNOSIS — D510 Vitamin B12 deficiency anemia due to intrinsic factor deficiency: Secondary | ICD-10-CM | POA: Diagnosis not present

## 2021-09-09 MED ORDER — CYANOCOBALAMIN 1000 MCG/ML IJ SOLN
1000.0000 ug | Freq: Once | INTRAMUSCULAR | Status: AC
  Administered 2021-09-09: 1000 ug via INTRAMUSCULAR

## 2021-09-09 NOTE — Progress Notes (Signed)
Pt given B12 w/o any complications. °

## 2021-09-17 ENCOUNTER — Ambulatory Visit (INDEPENDENT_AMBULATORY_CARE_PROVIDER_SITE_OTHER): Payer: Medicare Other | Admitting: Internal Medicine

## 2021-09-17 ENCOUNTER — Ambulatory Visit (INDEPENDENT_AMBULATORY_CARE_PROVIDER_SITE_OTHER): Payer: Medicare Other

## 2021-09-17 ENCOUNTER — Other Ambulatory Visit: Payer: Self-pay

## 2021-09-17 ENCOUNTER — Encounter: Payer: Self-pay | Admitting: Internal Medicine

## 2021-09-17 VITALS — BP 124/74 | HR 79 | Temp 98.2°F | Ht 69.0 in | Wt 165.0 lb

## 2021-09-17 DIAGNOSIS — M5441 Lumbago with sciatica, right side: Secondary | ICD-10-CM | POA: Insufficient documentation

## 2021-09-17 DIAGNOSIS — M5442 Lumbago with sciatica, left side: Secondary | ICD-10-CM

## 2021-09-17 DIAGNOSIS — I10 Essential (primary) hypertension: Secondary | ICD-10-CM | POA: Diagnosis not present

## 2021-09-17 DIAGNOSIS — N401 Enlarged prostate with lower urinary tract symptoms: Secondary | ICD-10-CM | POA: Diagnosis not present

## 2021-09-17 DIAGNOSIS — R351 Nocturia: Secondary | ICD-10-CM | POA: Diagnosis not present

## 2021-09-17 DIAGNOSIS — M48062 Spinal stenosis, lumbar region with neurogenic claudication: Secondary | ICD-10-CM

## 2021-09-17 DIAGNOSIS — Z23 Encounter for immunization: Secondary | ICD-10-CM | POA: Diagnosis not present

## 2021-09-17 DIAGNOSIS — R0789 Other chest pain: Secondary | ICD-10-CM

## 2021-09-17 DIAGNOSIS — D510 Vitamin B12 deficiency anemia due to intrinsic factor deficiency: Secondary | ICD-10-CM

## 2021-09-17 DIAGNOSIS — M47816 Spondylosis without myelopathy or radiculopathy, lumbar region: Secondary | ICD-10-CM | POA: Diagnosis not present

## 2021-09-17 DIAGNOSIS — R079 Chest pain, unspecified: Secondary | ICD-10-CM | POA: Diagnosis not present

## 2021-09-17 DIAGNOSIS — R918 Other nonspecific abnormal finding of lung field: Secondary | ICD-10-CM | POA: Insufficient documentation

## 2021-09-17 DIAGNOSIS — N1831 Chronic kidney disease, stage 3a: Secondary | ICD-10-CM

## 2021-09-17 DIAGNOSIS — I6381 Other cerebral infarction due to occlusion or stenosis of small artery: Secondary | ICD-10-CM | POA: Diagnosis not present

## 2021-09-17 LAB — URINALYSIS, ROUTINE W REFLEX MICROSCOPIC
Bilirubin Urine: NEGATIVE
Hgb urine dipstick: NEGATIVE
Ketones, ur: NEGATIVE
Leukocytes,Ua: NEGATIVE
Nitrite: NEGATIVE
RBC / HPF: NONE SEEN (ref 0–?)
Specific Gravity, Urine: 1.025 (ref 1.000–1.030)
Total Protein, Urine: NEGATIVE
Urine Glucose: NEGATIVE
Urobilinogen, UA: 0.2 (ref 0.0–1.0)
WBC, UA: NONE SEEN (ref 0–?)
pH: 5.5 (ref 5.0–8.0)

## 2021-09-17 LAB — TROPONIN I (HIGH SENSITIVITY): High Sens Troponin I: 4 ng/L (ref 2–17)

## 2021-09-17 LAB — CBC WITH DIFFERENTIAL/PLATELET
Basophils Absolute: 0 10*3/uL (ref 0.0–0.1)
Basophils Relative: 0.4 % (ref 0.0–3.0)
Eosinophils Absolute: 0.2 10*3/uL (ref 0.0–0.7)
Eosinophils Relative: 3 % (ref 0.0–5.0)
HCT: 41.4 % (ref 39.0–52.0)
Hemoglobin: 13.8 g/dL (ref 13.0–17.0)
Lymphocytes Relative: 17 % (ref 12.0–46.0)
Lymphs Abs: 1 10*3/uL (ref 0.7–4.0)
MCHC: 33.4 g/dL (ref 30.0–36.0)
MCV: 95.6 fl (ref 78.0–100.0)
Monocytes Absolute: 0.8 10*3/uL (ref 0.1–1.0)
Monocytes Relative: 13.3 % — ABNORMAL HIGH (ref 3.0–12.0)
Neutro Abs: 4.1 10*3/uL (ref 1.4–7.7)
Neutrophils Relative %: 66.3 % (ref 43.0–77.0)
Platelets: 184 10*3/uL (ref 150.0–400.0)
RBC: 4.33 Mil/uL (ref 4.22–5.81)
RDW: 13.4 % (ref 11.5–15.5)
WBC: 6.1 10*3/uL (ref 4.0–10.5)

## 2021-09-17 LAB — D-DIMER, QUANTITATIVE: D-Dimer, Quant: 0.81 mcg/mL FEU — ABNORMAL HIGH (ref <0.50)

## 2021-09-17 LAB — TSH: TSH: 0.95 u[IU]/mL (ref 0.35–5.50)

## 2021-09-17 LAB — PSA: PSA: 0.96 ng/mL (ref 0.10–4.00)

## 2021-09-17 MED ORDER — TADALAFIL 5 MG PO TABS: 5.0000 mg | ORAL_TABLET | Freq: Every day | ORAL | 1 refills | Status: DC

## 2021-09-17 NOTE — Patient Instructions (Signed)
Nonspecific Chest Pain, Adult °Chest pain is an uncomfortable, tight, or painful feeling in the chest. The pain can feel like a crushing, aching, or squeezing pressure. A person can feel a burning or tingling sensation. Chest pain can also be felt in your back, neck, jaw, shoulder, or arm. This pain can be worse when you move, sneeze, or take a deep breath. °Chest pain can be caused by a condition that is life-threatening. This must be treated right away. It can also be caused by something that is not life-threatening. If you have chest pain, it can be hard to know the difference, so it is important to get help right away to make sure that you do not have a serious condition. °Some life-threatening causes of chest pain include: °Heart attack. °A tear in the body's main blood vessel (aortic dissection). °Inflammation around your heart (pericarditis). °A problem in the lungs, such as a blood clot (pulmonary embolism) or a collapsed lung (pneumothorax). °Some non life-threatening causes of chest pain include: °Heartburn. °Anxiety or stress. °Damage to the bones, muscles, and cartilage that make up your chest wall. °Pneumonia or bronchitis. °Shingles infection (varicella-zoster virus). °Your chest pain may come and go. It may also be constant. Your health care provider will do tests and other studies to find the cause of your pain. Treatment will depend on the cause of your chest pain. °Follow these instructions at home: °Medicines °Take over-the-counter and prescription medicines only as told by your health care provider. °If you were prescribed an antibiotic medicine, take it as told by your health care provider. Do not stop taking the antibiotic even if you start to feel better. °Activity °Avoid any activities that cause chest pain. °Do not lift anything that is heavier than 10 lb (4.5 kg), or the limit that you are told, until your health care provider says that it is safe. °Rest as directed by your health care  provider. °Return to your normal activities only as told by your health care provider. Ask your health care provider what activities are safe for you. °Lifestyle °  °Do not use any products that contain nicotine or tobacco, such as cigarettes, e-cigarettes, and chewing tobacco. If you need help quitting, ask your health care provider. °Do not drink alcohol. °Make healthy lifestyle changes as recommended. These may include: °Getting regular exercise. Ask your health care provider to suggest some exercises that are safe for you. °Eating a heart-healthy diet. This includes plenty of fresh fruits and vegetables, whole grains, low-fat (lean) protein, and low-fat dairy products. A dietitian can help you find healthy eating options. °Maintaining a healthy weight. °Managing any other health conditions you may have, such as high blood pressure (hypertension) or diabetes. °Reducing stress, such as with yoga or relaxation techniques. °General instructions °Pay attention to any changes in your symptoms. °It is up to you to get the results of any tests that were done. Ask your health care provider, or the department that is doing the tests, when your results will be ready. °Keep all follow-up visits as told by your health care provider. This is important. °You may be asked to go for further testing if your chest pain does not go away. °Contact a health care provider if: °Your chest pain does not go away. °You feel depressed. °You have a fever. °You notice changes in your symptoms or develop new symptoms. °Get help right away if: °Your chest pain gets worse. °You have a cough that gets worse, or you cough up   blood. °You have severe pain in your abdomen. °You faint. °You have sudden, unexplained chest discomfort. °You have sudden, unexplained discomfort in your arms, back, neck, or jaw. °You have shortness of breath at any time. °You suddenly start to sweat, or your skin gets clammy. °You feel nausea or you vomit. °You suddenly  feel lightheaded or dizzy. °You have severe weakness, or unexplained weakness or fatigue. °Your heart begins to beat quickly, or it feels like it is skipping beats. °These symptoms may represent a serious problem that is an emergency. Do not wait to see if the symptoms will go away. Get medical help right away. Call your local emergency services (911 in the [place] Do not drive yourself to the hospital. °Summary °Chest pain can be caused by a condition that is serious and requires urgent treatment. It may also be caused by something that is not life-threatening. °Your health care provider may do lab tests and other studies to find the cause of your pain. °Follow your health care provider's instructions on taking medicines, making lifestyle changes, and getting emergency treatment if symptoms become worse. °Keep all follow-up visits as told by your health care provider. This includes visits for any further testing if your chest pain does not go away. °This information is not intended to replace advice given to you by your health care provider. Make sure you discuss any questions you have with your health care provider. °Document Revised: 01/17/2021 Document Reviewed: 01/17/2021 °Elsevier Patient Education © 2022 [employer] ° °

## 2021-09-17 NOTE — Progress Notes (Addendum)
Subjective:  Patient ID: Charles Robles, male    DOB: 09-03-1946  Age: 75 y.o. MRN: [id number]  CC: Cough and Back Pain  This visit occurred during the SARS-CoV-2 public health emergency.  Safety protocols were in place, including screening questions prior to the visit, additional usage of staff PPE, and extensive cleaning of exam room while observing appropriate contact time as indicated for disinfecting solutions.    HPI Charles Robles presents for f/up -  He complains of a 1 month hx bilateral, upper, anterior chest tightness with exertion.Marland Kitchen  He denies diaphoresis, dizziness, lightheadedness, hemoptysis, fever, chills, or night sweats.  He also complains of chronic worsening low back pain and lower extremity weakness.  Outpatient Medications Prior to Visit  Medication Sig Dispense Refill   amoxicillin (AMOXIL) 500 MG tablet Take 4 tablets (2,000 mg total) by mouth as directed. Take 4 tablets 1 hour prior to dental work, including cleanings. 12 tablet 12   cetirizine (ZYRTEC) 10 MG tablet Take 10 mg by mouth daily as needed for allergies.     esomeprazole (NEXIUM) 40 MG capsule Take 1 capsule (40 mg total) by mouth daily. 90 capsule 1   fish oil-omega-3 fatty acids 1000 MG capsule Take 1 g by mouth daily.     Glucosamine-Chondroit-Vit C-Mn (GLUCOSAMINE 1500 COMPLEX PO) Take by mouth.     nystatin cream (MYCOSTATIN) Apply 1 application topically 2 (two) times daily. 30 g 1   olmesartan (BENICAR) 20 MG tablet Take 1 tablet (20 mg total) by mouth daily. 90 tablet 1   rosuvastatin (CRESTOR) 10 MG tablet Take 1 tablet (10 mg total) by mouth daily. 90 tablet 0   sildenafil (VIAGRA) 100 MG tablet Take 1 tablet (100 mg total) by mouth daily as needed for erectile dysfunction. 10 tablet 3   sucralfate (CARAFATE) 1 g tablet Take 1 tablet (1 g total) by mouth 4 (four) times daily -  with meals and at bedtime. 360 tablet 0   No facility-administered medications prior to visit.     ROS Review of Systems  Constitutional:  Negative for appetite change, chills, diaphoresis, fatigue and fever.  HENT: Negative.  Negative for sore throat.   Eyes: Negative.   Respiratory:  Negative for cough, chest tightness, shortness of breath, wheezing and stridor.   Cardiovascular:  Positive for chest pain. Negative for palpitations and leg swelling.  Gastrointestinal:  Negative for abdominal pain, diarrhea, nausea and vomiting.  Genitourinary:  Positive for difficulty urinating.       +++nocturia  Musculoskeletal:  Positive for back pain. Negative for arthralgias and myalgias.  Neurological: Negative.  Negative for dizziness, weakness, light-headedness and numbness.  Hematological:  Negative for adenopathy. Does not bruise/bleed easily.  Psychiatric/Behavioral: Negative.     Objective:  BP 124/74 (BP Location: Left Arm, Patient Position: Sitting, Cuff Size: Large)   Pulse 79   Temp 98.2 F (36.8 C) (Oral)   Ht 5\' 9"  (1.753 m)   Wt 165 lb (74.8 kg)   SpO2 98%   BMI 24.37 kg/m   BP Readings from Last 3 Encounters:  09/17/21 124/74  08/27/21 130/78  05/16/21 (!) 160/80    Wt Readings from Last 3 Encounters:  09/17/21 165 lb (74.8 kg)  08/27/21 171 lb 6.4 oz (77.7 kg)  05/16/21 167 lb (75.8 kg)    Physical Exam Vitals reviewed.  HENT:     Mouth/Throat:     Mouth: Mucous membranes are moist.  Eyes:     General: No  scleral icterus.    Conjunctiva/sclera: Conjunctivae normal.  Cardiovascular:     Rate and Rhythm: Normal rate and regular rhythm.     Heart sounds: S1 normal and S2 normal. Murmur heard.  Systolic murmur is present with a grade of 2/6.  No diastolic murmur is present.     Comments: EKG-  NSR, 75 bpm ST dep in inf/lat leads - unchanged No Q waves Pulmonary:     Effort: Pulmonary effort is normal.     Breath sounds: No stridor. No wheezing, rhonchi or rales.  Abdominal:     General: Abdomen is flat.     Tenderness: There is no abdominal  tenderness. There is no right CVA tenderness, guarding or rebound.     Hernia: No hernia is present.  Musculoskeletal:        General: Normal range of motion.     Cervical back: Normal and neck supple.     Thoracic back: Normal.     Lumbar back: Normal. No bony tenderness. Negative right straight leg raise test and negative left straight leg raise test.     Right lower leg: No edema.     Left lower leg: No edema.  Lymphadenopathy:     Cervical: No cervical adenopathy.  Skin:    General: Skin is warm and dry.  Neurological:     General: No focal deficit present.     Mental Status: He is alert.  Psychiatric:        Mood and Affect: Mood normal.        Behavior: Behavior normal.    Lab Results  Component Value Date   WBC 6.1 09/17/2021   HGB 13.8 09/17/2021   HCT 41.4 09/17/2021   PLT 184.0 09/17/2021   GLUCOSE 116 (H) 04/24/2021   CHOL 234 (H) 04/24/2021   TRIG 378.0 (H) 05/16/2021   HDL 62.00 04/24/2021   LDLDIRECT 135.0 04/24/2021   LDLCALC 161 (H) 02/21/2019   ALT 17 07/06/2020   AST 18 07/06/2020   NA 138 04/24/2021   K 4.1 04/24/2021   CL 107 04/24/2021   CREATININE 1.19 04/24/2021   BUN 26 (H) 04/24/2021   CO2 24 04/24/2021   TSH 0.95 09/17/2021   PSA 0.96 09/17/2021   INR 1.0 07/08/2019   HGBA1C 5.4 05/16/2021    MR Brain Wo Contrast  Result Date: 05/08/2021 CLINICAL DATA:  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 EXAM: MRI HEAD WITHOUT CONTRAST TECHNIQUE: Multiplanar, multiecho pulse sequences of the brain and surrounding structures were obtained without intravenous contrast. COMPARISON:  Head CT February 16, 2012 FINDINGS: Brain: No acute infarction, hemorrhage, hydrocephalus, extra-axial collection or mass lesion. Scattered foci of T2 hyperintensity are seen within the white matter of the cerebral hemispheres and within the pons, nonspecific, most likely related to chronic small vessel ischemia. Remote lacunar infarct in the bilateral cerebellar hemispheres and bilateral corona  radiata. Mild parenchymal volume loss. Vascular: Normal flow voids. Skull and upper cervical spine: Normal marrow signal. Sinuses/Orbits: Mild mucosal thickening of the left maxillary sinus. The orbits are maintained. Other: Mild left mastoid effusion. IMPRESSION: 1. No acute intracranial abnormality. 2. Moderate chronic microvascular ischemic changes of the white matter mild parenchymal volume loss. 3. 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 Electronically Signed   By: Pedro Earls M.D.   On: 05/08/2021 15:35   06/2018 Prox RCA to Mid RCA lesion is 20% stenosed. Prox LAD to Mid LAD lesion is 20% stenosed. Prox Cx lesion  is 30% stenosed. Mid Cx lesion is 30% stenosed.   1. Mild non-obstructive CAD 2. Severe aortic stenosis by echo. By cath, mean gradient 15.7 mmHg, peak to peak gradient 21 mmHg, AVA 1.45 cm2,    Recommendations: Pt with chest pain, dizziness and fatigue felt to be due to his aortic stenosis. Continue workup and planning for TAVR.   IMPRESSION: Questionable nodular densities in lower chest. At least 2 of these nodular densities could be nipple shadows. Indeterminate densities in the retrocardiac space could represent overlying shadows but indeterminate. Consider short-term follow-up two view chest exam with nipple markers.   Electronically Signed   By: Markus Daft M.D.   On: 09/17/2021 14:32  IMPRESSION: No recent fracture is seen. Osteopenia. Severe degenerative changes are noted with disc space narrowing, bony spurs and facet hypertrophy at multiple levels, more so at L4-L5 and L5-S1 levels.   Electronically Signed   By: Elmer Picker M.D.   On: 09/17/2021 16:26  Assessment & Plan:   Charles Robles was seen today for cough and back pain.  Diagnoses and all orders for this visit:  Vitamin B12 deficiency anemia due to intrinsic factor deficiency- Will continue parenteral B12 replacement  therapy. -     CBC with Differential/Platelet; Future -     CBC with Differential/Platelet  BPH associated with nocturia -     PSA; Future -     Urinalysis, Routine w reflex microscopic; Future -     tadalafil (CIALIS) 5 MG tablet; Take 1 tablet (5 mg total) by mouth daily. -     Urinalysis, Routine w reflex microscopic -     PSA  Sensation of chest tightness- His EKG and enzymes are reassuring.  His CXR is abnormal - will get a chest CT. -     Pro b natriuretic peptide (BNP); Future -     Troponin I (High Sensitivity); Future -     D-dimer, quantitative; Future -     DG Chest 2 View; Future -     EKG 12-Lead -     D-dimer, quantitative -     Troponin I (High Sensitivity) -     Pro b natriuretic peptide (BNP)  Primary hypertension- His blood pressure is well controlled. -     TSH; Future -     Urinalysis, Routine w reflex microscopic; Future -     Urinalysis, Routine w reflex microscopic -     TSH  Stage 3a chronic kidney disease ([place])- He will avoid nephrotoxic agents. -     Urinalysis, Routine w reflex microscopic; Future -     Urinalysis, Routine w reflex microscopic  Acute bilateral low back pain with bilateral sciatica -     DG Lumbar Spine Complete; Future  Need for immunization against influenza -     Flu Vaccine QUAD High Dose(Fluad)  Abnormal chest x-ray with multiple lung nodules -     CT Chest Wo Contrast; Future  Spinal stenosis of lumbar region with neurogenic claudication -     Ambulatory referral to Orthopedic Surgery  I am having Wynona Luna. Bernette Redbird or Dr. Roselie Awkward" start on tadalafil. I am also having him maintain his fish oil-omega-3 fatty acids, cetirizine, amoxicillin, nystatin cream, Glucosamine-Chondroit-Vit C-Mn (GLUCOSAMINE 1500 COMPLEX PO), sildenafil, esomeprazole, olmesartan, rosuvastatin, and sucralfate.  Meds ordered this encounter  Medications   tadalafil (CIALIS) 5 MG tablet    Sig: Take 1 tablet (5 mg total) by mouth daily.     Dispense:  90 tablet  Refill:  1     Follow-up: Return in about 4 weeks (around 10/15/2021).  Scarlette Calico, MD

## 2021-09-18 LAB — PRO B NATRIURETIC PEPTIDE: NT-Pro BNP: 170 pg/mL (ref 0–486)

## 2021-09-26 ENCOUNTER — Ambulatory Visit (INDEPENDENT_AMBULATORY_CARE_PROVIDER_SITE_OTHER): Payer: Medicare Other | Admitting: Orthopaedic Surgery

## 2021-09-26 ENCOUNTER — Other Ambulatory Visit: Payer: Self-pay

## 2021-09-26 ENCOUNTER — Encounter: Payer: Self-pay | Admitting: Orthopaedic Surgery

## 2021-09-26 DIAGNOSIS — I6381 Other cerebral infarction due to occlusion or stenosis of small artery: Secondary | ICD-10-CM | POA: Diagnosis not present

## 2021-09-26 DIAGNOSIS — G8929 Other chronic pain: Secondary | ICD-10-CM

## 2021-09-26 DIAGNOSIS — M545 Low back pain, unspecified: Secondary | ICD-10-CM

## 2021-09-26 NOTE — Progress Notes (Signed)
Office Visit Note   Patient: Charles Robles           Date of Birth: [birth year]           MRN: [id number] Visit Date: 09/26/2021              Requested by: Janith Lima, MD [address] [place],  [place] [address] PCP: Janith Lima, MD   Assessment & Plan: Visit Diagnoses:  1. Chronic midline low back pain without sciatica     Plan: Impression is lumbar spondylosis and mild degenerative scoliosis.  I am not getting the sense that he has radiculopathy.  I recommend 6-week course of outpatient physical therapy and relative rest.  He was provided with a lumbar corset for strenuous or lifting activities.  We will see him back as needed.  I recommend Tylenol as needed.  Follow-Up Instructions: No follow-ups on file.   Orders:  Orders Placed This Encounter  Procedures   Ambulatory referral to Physical Therapy   No orders of the defined types were placed in this encounter.     Procedures: No procedures performed   Clinical Data: No additional findings.   Subjective: Chief Complaint  Patient presents with   Lower Back - Pain    Patient is a very pleasant 75 year old gentleman comes in for chronic low back pain for years without any specific injuries.  He feels some numbness and weakness in his legs.  He denies any constant groin pain.  He takes over-the-counter medications occasionally.  Has not done any formal physical therapy.  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  Denies any bowel or bladder dysfunction.   Review of Systems  Constitutional: Negative.   All other systems reviewed and are negative.   Objective: Vital Signs: There were no vitals taken for this visit.  Physical Exam Vitals and nursing note reviewed.  Constitutional:      Appearance: He is well-developed.  Pulmonary:     Effort: Pulmonary effort is normal.  Abdominal:     Palpations: Abdomen is soft.  Skin:    General: Skin is warm.   Neurological:     Mental Status: He is alert and oriented to person, place, and time.  Psychiatric:        Behavior: Behavior normal.        Thought Content: Thought content normal.        Judgment: Judgment normal.    Ortho Exam  Lumbar spine exam shows decreased forward flexion.  He has tight hamstrings.  Increased popliteal angle.  No focal motor or sensory deficits in the lower extremities.  Specialty Comments:  No specialty comments available.  Imaging: No results found.   PMFS History: Patient Active Problem List   Diagnosis Date Noted   BPH associated with nocturia 09/17/2021   Sensation of chest tightness 09/17/2021   Acute bilateral low back pain with bilateral sciatica 09/17/2021   Abnormal chest x-ray with multiple lung nodules 09/17/2021   Need for immunization against influenza 09/17/2021   Spinal stenosis of lumbar region with neurogenic claudication 09/17/2021   Chronic hyperglycemia 05/16/2021   Hypertriglyceridemia 05/16/2021   Vitamin B12 deficiency anemia due to intrinsic factor deficiency 05/16/2021   Multiple lacunar infarcts ([place]) 05/10/2021   Gastroesophageal reflux disease with esophagitis and hemorrhage 04/25/2021   Stage 3a chronic kidney disease ([place]) 04/24/2021   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 noted on pre TAVR CT, will need 1 year follow up given smoking history    S/P TAVR (transcatheter aortic valve replacement)    a. s/p TAVR with a 26 mm Edwards sapein 3 Ultra valve via  the TF approach on 07/12/19   Severe aortic stenosis    a. s/p TAVR with a 26 mm Edwards sapein 3 Ultra valve via the TF approach on 07/12/19   Stool incontinence    Urgency of urination    Vision loss of right eye     Family History  Problem Relation Age of Onset   Heart disease Father        MI   Hyperlipidemia Father    Hypertension Mother    Alcohol abuse Mother    Hyperlipidemia Brother    Hypertension Brother    Colon cancer Neg Hx    Colon polyps Neg Hx    Rectal cancer Neg Hx    Stomach cancer Neg Hx    Esophageal cancer Neg Hx     Past Surgical History:  Procedure Laterality Date   COLONOSCOPY  2017   EYE SURGERY  1981    Right eye- corneal transplant   HERNIA REPAIR     x3   POLYPECTOMY     RIGHT/LEFT HEART CATH AND CORONARY ANGIOGRAPHY N/A 06/23/2018   Procedure: RIGHT/LEFT HEART CATH AND CORONARY ANGIOGRAPHY;  Surgeon: Burnell Blanks, MD;  Location: [facility];  Service: Cardiovascular;  Laterality: N/A;   TEE WITHOUT CARDIOVERSION N/A 07/12/2019   Procedure: TRANSESOPHAGEAL ECHOCARDIOGRAM (TEE);  Surgeon: Burnell Blanks, MD;  Location: [facility];  Service: Open Heart Surgery;  Laterality: N/A;   TONSILLECTOMY     TRANSCATHETER AORTIC VALVE REPLACEMENT, TRANSFEMORAL N/A 07/12/2019   Procedure: TRANSCATHETER AORTIC VALVE REPLACEMENT, TRANSFEMORAL;  Surgeon: Burnell Blanks, MD;  Location: [facility];  Service: Open Heart Surgery;  Laterality: N/A;   Social History   Occupational History   Occupation: Retired-Psychologist  Tobacco Use   Smoking status: Some Days    Types: Cigars   Smokeless tobacco: Never  Vaping Use   Vaping Use: Never used  Substance and Sexual Activity   Alcohol use: Yes    Alcohol/week: 32.0 standard drinks    Types: 30 Cans of beer, 2 Glasses of wine per week   Drug use: Yes    Types: Marijuana    Comment: 1-2 week   Sexual activity: Never

## 2021-09-27 ENCOUNTER — Encounter: Payer: Self-pay | Admitting: Rehabilitative and Restorative Service Providers"

## 2021-09-27 ENCOUNTER — Ambulatory Visit (INDEPENDENT_AMBULATORY_CARE_PROVIDER_SITE_OTHER): Payer: Medicare Other | Admitting: Rehabilitative and Restorative Service Providers"

## 2021-09-27 DIAGNOSIS — R293 Abnormal posture: Secondary | ICD-10-CM

## 2021-09-27 DIAGNOSIS — G8929 Other chronic pain: Secondary | ICD-10-CM

## 2021-09-27 DIAGNOSIS — M6281 Muscle weakness (generalized): Secondary | ICD-10-CM

## 2021-09-27 DIAGNOSIS — M545 Low back pain, unspecified: Secondary | ICD-10-CM

## 2021-09-27 NOTE — Therapy (Signed)
[facility] [address] [place], [place], [address] Phone: [phone]   Fax:  [phone]  Physical Therapy Evaluation  Patient Details  Name: Charles Robles MRN: [id number] Date of Birth: 29-Jun-1946 Referring Provider (PT): Leandrew Koyanagi MD   Encounter Date: 09/27/2021   PT End of Session - 09/27/21 1611     Visit Number 1    Number of Visits 12    Date for PT Re-Evaluation 11/15/21    Progress Note Due on Visit 10    PT Start Time 3149    PT Stop Time 7026    PT Time Calculation (min) 47 min    Activity Tolerance Patient tolerated treatment well;No increased pain    Behavior During Therapy WFL for tasks assessed/performed             Past Medical History:  Diagnosis Date   Abnormal large bowel motility    Arthritis    Basal cell carcinoma 2017   left back bcc tx cx3 80fu   Hearing loss    Heart murmur    Hyperlipidemia    Hypertension    Melanoma ([place])    melanoma per patient years ago   Other fatigue    Pulmonary nodule    a. noted on pre TAVR CT, will need 1 year follow up given smoking history    S/P TAVR (transcatheter aortic valve replacement)    a. s/p TAVR with a 26 mm Edwards sapein 3 Ultra valve via the TF approach on 07/12/19   Severe aortic stenosis    a. s/p TAVR with a 26 mm Edwards sapein 3 Ultra valve via the TF approach on 07/12/19   Stool incontinence    Urgency of urination    Vision loss of right eye     Past Surgical History:  Procedure Laterality Date   COLONOSCOPY  2017   EYE SURGERY  1981    Right eye- corneal transplant   HERNIA REPAIR     x3   POLYPECTOMY     RIGHT/LEFT HEART CATH AND CORONARY ANGIOGRAPHY N/A 06/23/2018   Procedure: RIGHT/LEFT HEART CATH AND CORONARY ANGIOGRAPHY;  Surgeon: Burnell Blanks, MD;  Location: [facility];  Service: Cardiovascular;  Laterality: N/A;   TEE WITHOUT CARDIOVERSION N/A 07/12/2019   Procedure: TRANSESOPHAGEAL ECHOCARDIOGRAM  (TEE);  Surgeon: Burnell Blanks, MD;  Location: [facility];  Service: Open Heart Surgery;  Laterality: N/A;   TONSILLECTOMY     TRANSCATHETER AORTIC VALVE REPLACEMENT, TRANSFEMORAL N/A 07/12/2019   Procedure: TRANSCATHETER AORTIC VALVE REPLACEMENT, TRANSFEMORAL;  Surgeon: Burnell Blanks, MD;  Location: [facility];  Service: Open Heart Surgery;  Laterality: N/A;    There were no vitals filed for this visit.    Subjective Assessment - 09/27/21 1607     Subjective Charles Robles has a history of chronic LBP.  He spends most of his day reading.  He does most of his house chores including dishes.  He walks 3 dogs and splits work to heat his home.  He has some anterior thigh paresthesias but denies sciatic symptoms.    Pertinent History HTN, Chronic kidney disease, previous heart procedures    Limitations House hold activities;Lifting    How long can you sit comfortably? Discussed taking breaks to avoid prolonged sitting    How long can you stand comfortably? Depends on activity, flexion is worst    How long can you walk comfortably? Walks dogs, not walking for exercise    Patient Stated  Goals Reduce low back pain    Currently in Pain? Yes    Pain Score 3     Pain Location Back    Pain Orientation Lower    Pain Descriptors / Indicators Aching;Constant    Pain Type Chronic pain    Pain Radiating Towards Some anterior thigh paresthesias    Pain Onset More than a month ago    Pain Frequency Constant    Aggravating Factors  Flexion and late day activities    Effect of Pain on Daily Activities Would like to have less pain to have better endurance with ADLs    Multiple Pain Sites No                [facility] PT Assessment - 09/27/21 0001       Assessment   Medical Diagnosis Chronic midline low back pain without scoliosis    Referring Provider (PT) Georga Kaufmann Ephriam Jenkins MD    Onset Date/Surgical Date --   Chronic     Precautions   Precautions Back    Precaution Comments Avoid flexed postures and  flexion with rotation      Restrictions   Weight Bearing Restrictions No      Balance Screen   Has the patient fallen in the past 6 months Yes    How many times? 1    Has the patient had a decrease in activity level because of a fear of falling?  No    Is the patient reluctant to leave their home because of a fear of falling?  No      Home Ecologist residence    Additional Comments Splits wood, walks dogs      Prior Function   Level of [place] Retired    Nature conservation officer, walks dogs, splits Film/video editor   Overall Cognitive Status Within Functional Limits for tasks assessed      Observation/Other Assessments   Focus on Therapeutic Outcomes (FOTO)  54 (Goal 73 by visit 10)      Posture/Postural Control   Posture/Postural Control Postural limitations    Postural Limitations Forward head;Rounded Shoulders;Decreased lumbar lordosis      ROM / Strength   AROM / PROM / Strength AROM;Strength      AROM   Overall AROM  Deficits    AROM Assessment Site Lumbar;Hip    Right/Left Hip Left;Right    Right Hip Flexion 85    Right Hip External Rotation  32    Right Hip Internal Rotation  5    Left Hip Flexion 85    Left Hip External Rotation  25    Left Hip Internal Rotation  3    Lumbar Extension 5      Strength   Overall Strength Comments Deferred due to postural impairments      Flexibility   Soft Tissue Assessment /Muscle Length yes    Hamstrings 30 degrees/35 degrees                        Objective measurements completed on examination: See above findings.       [facility] PT Treatment/Exercise - 09/27/21 0001       Therapeutic Activites    Therapeutic Activities ADL's    ADL's Dishwashing mechanics, feeding dogs, bed mobility (log roll) and avoiding flexion with rotation and prolonged postures      Exercises   Exercises  Lumbar      Lumbar Exercises: Stretches   Active Hamstring  Stretch Left;Right;4 reps;20 seconds    Single Knee to Chest Stretch Left;Right;4 reps;20 seconds    Other Lumbar Stretch Exercise Standing trunk/hip extension (push hips forward) 10X 3 seconds      Lumbar Exercises: Standing   Other Standing Lumbar Exercises Shoulder blade pinches 10X 3 seconds                     PT Education - 09/27/21 1610     Education Details Reviewed spine anatomy, imaging, exam results.  Practical body mechanics for dishes, dogs and bed mobility.  Started phase 1 HEP.    Person(s) Educated Patient    Methods Explanation;Demonstration;Verbal cues;Handout;Tactile cues    Comprehension Verbalized understanding;Tactile cues required;Need further instruction;Returned demonstration;Verbal cues required              PT Short Term Goals - 09/27/21 1618       PT SHORT TERM GOAL #1   Title Improve trunk extension AROM to 10 degrees.    Baseline 5 degrees.    Time 4    Period Weeks    Status New    Target Date 10/25/21               PT Long Term Goals - 09/27/21 1618       PT LONG TERM GOAL #1   Title Improve FOTO to 73.    Baseline 54    Time 7    Period Weeks    Status New    Target Date 11/15/21      PT LONG TERM GOAL #2   Title Charles Robles will report low back pain consistently 0-3/10 on the Numeric Pain Rating Scale.    Baseline Can be 4-5/10    Time 7    Period Weeks    Status New    Target Date 11/15/21      PT LONG TERM GOAL #3   Title Improve flexibility for hip flexors to 100; hamstrings to 45 and hip ER to 40 degrees.    Baseline 85; 30/35 and 25/32 respectively    Time 7    Period Weeks    Status New    Target Date 11/15/21      PT LONG TERM GOAL #4   Title Improve spine/core strength as assessed by FOTO and subjective self-report.    Baseline Late day fatigue is limiting function.    Time 7    Period Weeks    Status New    Target Date 11/15/21      PT LONG TERM GOAL #5   Title Charles Robles will be independent with  his long-term HEP at [place].    Baseline Started 09/27/2021    Time 7    Period Weeks    Status New    Target Date 11/15/21                    Plan - 09/27/21 1613     Clinical Impression Statement Charles Robles has a history of chronic LBP.  With the exception of some anterior thigh paresthesias, he denies radicular symptoms.  He has a flexed postures, less than optimal body mechanics, limited lumbar extension AROM, tight hips and he fatigues quickly with activities (particularly activities involving flexion and activities later in the day).  He will benefit from the recommended plan of care.    Personal Factors and Comorbidities Comorbidity 3+  Comorbidities HTN, Chronic kidney disease, previous heart procedures    Examination-Activity Limitations Stand;Bed Mobility;Bend;Transfers;Lift    Examination-Participation Restrictions Yard Work;Cleaning;Community Activity    Stability/Clinical Decision Making Stable/Uncomplicated    Clinical Decision Making Low    Rehab Potential Good    PT Frequency --   1-2X/week   PT Duration Other (comment)   7 weeks   PT Treatment/Interventions ADLs/Self Care Home Management;Cryotherapy;Moist Heat;Traction;Therapeutic activities;Neuromuscular re-education;Therapeutic exercise;Patient/family education;Manual techniques;Dry needling    PT Next Visit Plan Review day 1 HEP, practical body mechanics, figure 4 stretch supine, pull to chest, sit to stand, additional quadriceps and low back strengthening    PT Home Exercise Plan Access Code: [email or web address]    Consulted and Agree with Plan of Care Patient             Patient will benefit from skilled therapeutic intervention in order to improve the following deficits and impairments:  Decreased activity tolerance, Decreased endurance, Decreased range of motion, Decreased strength, Impaired flexibility, Impaired perceived functional ability, Improper body mechanics, Postural dysfunction, Pain  Visit  Diagnosis: Abnormal posture  Muscle weakness (generalized)  Chronic midline low back pain, unspecified whether sciatica present     Problem List Patient Active Problem List   Diagnosis Date Noted   BPH associated with nocturia 09/17/2021   Sensation of chest tightness 09/17/2021   Acute bilateral low back pain with bilateral sciatica 09/17/2021   Abnormal chest x-ray with multiple lung nodules 09/17/2021   Need for immunization against influenza 09/17/2021   Spinal stenosis of lumbar region with neurogenic claudication 09/17/2021   Chronic hyperglycemia 05/16/2021   Hypertriglyceridemia 05/16/2021   Vitamin B12 deficiency anemia due to intrinsic factor deficiency 05/16/2021   Multiple lacunar infarcts ([place]) 05/10/2021   Gastroesophageal reflux disease with esophagitis and hemorrhage 04/25/2021   Stage 3a chronic kidney disease ([place]) 04/24/2021   S/P TAVR (transcatheter aortic valve replacement) 07/12/2019   Pulmonary nodule    Dyslipidemia, goal LDL below 160 01/17/2016   Severe aortic stenosis 05/23/2014   HTN (hypertension) 02/12/2012    Farley Ly, PT, MPT 09/27/2021, 4:23 PM  [facility] [address] [place], [place], [address] Phone: [phone]   Fax:  [phone]  Name: Charles Robles MRN: [id number] Date of Birth: 12-Apr-1946

## 2021-09-27 NOTE — Patient Instructions (Signed)
Access Code: [email or web address] URL: [email or web address] Date: 09/27/2021 Prepared by: Vista Mink  Exercises Standing Scapular Retraction - 5 x daily - 7 x weekly - 1 sets - 5 reps - 5 second hold Standing Lumbar Extension at Wall - Forearms - 5 x daily - 7 x weekly - 1 sets - 5 reps - 3 seconds hold Single Knee to Chest Stretch - 2-3 x daily - 7 x weekly - 1 sets - 4-5 reps - 20 seconds hold Supine Hamstring Stretch - 2-3 x daily - 7 x weekly - 1 sets - 4-5 reps - 20 seconds hold

## 2021-10-03 ENCOUNTER — Ambulatory Visit
Admission: RE | Admit: 2021-10-03 | Discharge: 2021-10-03 | Disposition: A | Discharge status: Home / Self Care | Payer: Medicare Other | Source: Ambulatory Visit | Attending: Internal Medicine | Admitting: Internal Medicine

## 2021-10-03 DIAGNOSIS — I700 Atherosclerosis of aorta: Secondary | ICD-10-CM | POA: Diagnosis not present

## 2021-10-03 DIAGNOSIS — R918 Other nonspecific abnormal finding of lung field: Secondary | ICD-10-CM

## 2021-10-04 ENCOUNTER — Encounter: Payer: Self-pay | Admitting: Internal Medicine

## 2021-10-07 ENCOUNTER — Encounter: Payer: Medicare Other | Admitting: Rehabilitative and Restorative Service Providers"

## 2021-10-07 ENCOUNTER — Other Ambulatory Visit: Payer: Self-pay

## 2021-10-07 ENCOUNTER — Ambulatory Visit (INDEPENDENT_AMBULATORY_CARE_PROVIDER_SITE_OTHER): Payer: Medicare Other

## 2021-10-07 DIAGNOSIS — D510 Vitamin B12 deficiency anemia due to intrinsic factor deficiency: Secondary | ICD-10-CM | POA: Diagnosis not present

## 2021-10-07 MED ORDER — CYANOCOBALAMIN 1000 MCG/ML IJ SOLN
1000.0000 ug | Freq: Once | INTRAMUSCULAR | Status: AC
  Administered 2021-10-07: 1000 ug via INTRAMUSCULAR

## 2021-10-07 NOTE — Progress Notes (Signed)
Pt was given B12 w/o any complications. 

## 2021-10-15 ENCOUNTER — Encounter: Payer: Self-pay | Admitting: Rehabilitative and Restorative Service Providers"

## 2021-10-15 ENCOUNTER — Ambulatory Visit (INDEPENDENT_AMBULATORY_CARE_PROVIDER_SITE_OTHER): Payer: Medicare Other | Admitting: Rehabilitative and Restorative Service Providers"

## 2021-10-15 ENCOUNTER — Other Ambulatory Visit: Payer: Self-pay

## 2021-10-15 DIAGNOSIS — G8929 Other chronic pain: Secondary | ICD-10-CM | POA: Diagnosis not present

## 2021-10-15 DIAGNOSIS — M6281 Muscle weakness (generalized): Secondary | ICD-10-CM | POA: Diagnosis not present

## 2021-10-15 DIAGNOSIS — R293 Abnormal posture: Secondary | ICD-10-CM | POA: Diagnosis not present

## 2021-10-15 DIAGNOSIS — M545 Low back pain, unspecified: Secondary | ICD-10-CM

## 2021-10-15 NOTE — Patient Instructions (Signed)
Access Code: [email or web address] URL: [email or web address] Date: 10/15/2021 Prepared by: Vista Mink  Exercises Standing Scapular Retraction - 5 x daily - 7 x weekly - 1 sets - 5 reps - 5 second hold Standing Lumbar Extension at Wall - Forearms - 5 x daily - 7 x weekly - 1 sets - 5 reps - 3 seconds hold Single Knee to Chest Stretch - 2 x daily - 7 x weekly - 1 sets - 4-5 reps - 20 seconds hold Supine Hamstring Stretch - 2 x daily - 7 x weekly - 1 sets - 4-5 reps - 20 seconds hold Supine Figure 4 Piriformis Stretch - 2 x daily - 7 x weekly - 1 sets - 5 reps - 20 seconds hold Prone Hip Extension - 1 x daily - 7 x weekly - 2-3 sets - 10 reps - 3 seconds hold Sit to Stand with Armchair - 5 x daily - 7 x weekly - 1 sets - 1 reps

## 2021-10-15 NOTE — Therapy (Signed)
[facility] [address] [place], [place], [address] Phone: [phone]   Fax:  [phone]  Physical Therapy Treatment  Patient Details  Name: Charles Robles MRN: [id number] Date of Birth: May 24, 1946 Referring Provider (PT): Leandrew Koyanagi MD   Encounter Date: 10/15/2021   PT End of Session - 10/15/21 1347     Visit Number 2    Number of Visits 12    Date for PT Re-Evaluation 11/15/21    Progress Note Due on Visit 10    PT Start Time 1302    PT Stop Time 1344    PT Time Calculation (min) 42 min    Activity Tolerance Patient tolerated treatment well;No increased pain    Behavior During Therapy WFL for tasks assessed/performed             Past Medical History:  Diagnosis Date   Abnormal large bowel motility    Arthritis    Basal cell carcinoma 2017   left back bcc tx cx3 14fu   Hearing loss    Heart murmur    Hyperlipidemia    Hypertension    Melanoma ([place])    melanoma per patient years ago   Other fatigue    Pulmonary nodule    a. noted on pre TAVR CT, will need 1 year follow up given smoking history    S/P TAVR (transcatheter aortic valve replacement)    a. s/p TAVR with a 26 mm Edwards sapein 3 Ultra valve via the TF approach on 07/12/19   Severe aortic stenosis    a. s/p TAVR with a 26 mm Edwards sapein 3 Ultra valve via the TF approach on 07/12/19   Stool incontinence    Urgency of urination    Vision loss of right eye     Past Surgical History:  Procedure Laterality Date   COLONOSCOPY  2017   EYE SURGERY  1981    Right eye- corneal transplant   HERNIA REPAIR     x3   POLYPECTOMY     RIGHT/LEFT HEART CATH AND CORONARY ANGIOGRAPHY N/A 06/23/2018   Procedure: RIGHT/LEFT HEART CATH AND CORONARY ANGIOGRAPHY;  Surgeon: Burnell Blanks, MD;  Location: [facility];  Service: Cardiovascular;  Laterality: N/A;   TEE WITHOUT CARDIOVERSION N/A 07/12/2019   Procedure: TRANSESOPHAGEAL ECHOCARDIOGRAM (TEE);   Surgeon: Burnell Blanks, MD;  Location: [facility];  Service: Open Heart Surgery;  Laterality: N/A;   TONSILLECTOMY     TRANSCATHETER AORTIC VALVE REPLACEMENT, TRANSFEMORAL N/A 07/12/2019   Procedure: TRANSCATHETER AORTIC VALVE REPLACEMENT, TRANSFEMORAL;  Surgeon: Burnell Blanks, MD;  Location: [facility];  Service: Open Heart Surgery;  Laterality: N/A;    There were no vitals filed for this visit.   Subjective Assessment - 10/15/21 1313     Subjective Charles Robles is taking tylenol about 2X/day.  Charles Robles reports improvements in his low back pain and no "terrible" pain.    Pertinent History HTN, Chronic kidney disease, previous heart procedures    Limitations House hold activities;Lifting    How long can you sit comfortably? An hour    How long can you stand comfortably? Depends on activity, flexion is worst    How long can you walk comfortably? Walks dogs, not walking for exercise    Patient Stated Goals Reduce low back pain    Currently in Pain? No/denies    Pain Score 2     Pain Location Back    Pain Orientation Lower    Pain  Descriptors / Indicators Aching;Constant    Pain Type Chronic pain    Pain Radiating Towards More weakness    Pain Onset More than a month ago    Pain Frequency Constant    Aggravating Factors  Flexion and late day activities    Effect of Pain on Daily Activities Would like to have less pain to have better endurance with ADLs    Multiple Pain Sites No                               [facility] Adult PT Treatment/Exercise - 10/15/21 0001       Posture/Postural Control   Posture/Postural Control Postural limitations    Postural Limitations Forward head;Rounded Shoulders;Decreased lumbar lordosis      Therapeutic Activites    Therapeutic Activities ADL's    ADL's Golfer's and diagonal lifts, dishes and house chores mechanics, log roll and bed mobility      Exercises   Exercises Lumbar      Lumbar Exercises: Stretches   Active Hamstring  Stretch Left;Right;4 reps;20 seconds    Single Knee to Chest Stretch Left;Right;4 reps;20 seconds    Figure 4 Stretch 4 reps;20 seconds    Other Lumbar Stretch Exercise Standing trunk/hip extension (push hips forward) 10X 3 seconds      Lumbar Exercises: Standing   Other Standing Lumbar Exercises Shoulder blade pinches 10X 3 seconds      Lumbar Exercises: Seated   Sit to Stand 5 reps;Limitations    Sit to Stand Limitations slow eccentrics      Lumbar Exercises: Prone   Straight Leg Raise 10 reps;3 seconds                     PT Education - 10/15/21 1346     Education Details Reviewed day 1 HEP and body mechanics.  Added activities to address impairments noted at evaluation.    Person(s) Educated Patient    Methods Explanation;Demonstration;Tactile cues;Verbal cues;Handout    Comprehension Tactile cues required;Verbalized understanding;Returned demonstration;Need further instruction;Verbal cues required              PT Short Term Goals - 09/27/21 1618       PT SHORT TERM GOAL #1   Title Improve trunk extension AROM to 10 degrees.    Baseline 5 degrees.    Time 4    Period Weeks    Status New    Target Date 10/25/21               PT Long Term Goals - 09/27/21 1618       PT LONG TERM GOAL #1   Title Improve FOTO to 73.    Baseline 54    Time 7    Period Weeks    Status New    Target Date 11/15/21      PT LONG TERM GOAL #2   Title Charles Robles will report low back pain consistently 0-3/10 on the Numeric Pain Rating Scale.    Baseline Can be 4-5/10    Time 7    Period Weeks    Status New    Target Date 11/15/21      PT LONG TERM GOAL #3   Title Improve flexibility for hip flexors to 100; hamstrings to 45 and hip ER to 40 degrees.    Baseline 85; 30/35 and 25/32 respectively    Time 7    Period Weeks  Status New    Target Date 11/15/21      PT LONG TERM GOAL #4   Title Improve spine/core strength as assessed by FOTO and subjective  self-report.    Baseline Late day fatigue is limiting function.    Time 7    Period Weeks    Status New    Target Date 11/15/21      PT LONG TERM GOAL #5   Title Charles Robles will be independent with his long-term HEP at [place].    Baseline Started 09/27/2021    Time 7    Period Weeks    Status New    Target Date 11/15/21                   Plan - 10/15/21 1347     Clinical Impression Statement Charles Robles reports early progress with his physical therapy.  He has not had severe pain.  He has been paying more attention to posture and body mechanics.  With improved HEP compliance and continued attention to posture and body mechanics, Charles Robles should meet short and long-term goals.    Personal Factors and Comorbidities Comorbidity 3+    Comorbidities HTN, Chronic kidney disease, previous heart procedures    Examination-Activity Limitations Stand;Bed Mobility;Bend;Transfers;Lift    Examination-Participation Restrictions Yard Work;Cleaning;Community Activity    Stability/Clinical Decision Making Stable/Uncomplicated    Rehab Potential Good    PT Frequency --   1-2X/week   PT Duration Other (comment)   7 weeks   PT Treatment/Interventions ADLs/Self Care Home Management;Cryotherapy;Moist Heat;Traction;Therapeutic activities;Neuromuscular re-education;Therapeutic exercise;Patient/family education;Manual techniques;Dry needling    PT Next Visit Plan Practical body mechanics, pull to chest, additional quadriceps and low back strengthening    PT Home Exercise Plan Access Code: [email or web address]    Consulted and Agree with Plan of Care Patient             Patient will benefit from skilled therapeutic intervention in order to improve the following deficits and impairments:  Decreased activity tolerance, Decreased endurance, Decreased range of motion, Decreased strength, Impaired flexibility, Impaired perceived functional ability, Improper body mechanics, Postural dysfunction, Pain  Visit Diagnosis: Abnormal  posture  Muscle weakness (generalized)  Chronic midline low back pain, unspecified whether sciatica present     Problem List Patient Active Problem List   Diagnosis Date Noted   BPH associated with nocturia 09/17/2021   Sensation of chest tightness 09/17/2021   Acute bilateral low back pain with bilateral sciatica 09/17/2021   Abnormal chest x-ray with multiple lung nodules 09/17/2021   Need for immunization against influenza 09/17/2021   Spinal stenosis of lumbar region with neurogenic claudication 09/17/2021   Chronic hyperglycemia 05/16/2021   Hypertriglyceridemia 05/16/2021   Vitamin B12 deficiency anemia due to intrinsic factor deficiency 05/16/2021   Multiple lacunar infarcts ([place]) 05/10/2021   Gastroesophageal reflux disease with esophagitis and hemorrhage 04/25/2021   Stage 3a chronic kidney disease ([place]) 04/24/2021   S/P TAVR (transcatheter aortic valve replacement) 07/12/2019   Pulmonary nodule    Dyslipidemia, goal LDL below 160 01/17/2016   Severe aortic stenosis 05/23/2014   HTN (hypertension) 02/12/2012    Farley Ly, PT, MPT 10/15/2021, 1:51 PM  [facility] [address] [place], [place], [address] Phone: [phone]   Fax:  [phone]  Name: Charles Robles MRN: [id number] Date of Birth: 07-08-1946

## 2021-10-18 ENCOUNTER — Encounter: Payer: Medicare Other | Admitting: Rehabilitative and Restorative Service Providers"

## 2021-10-23 ENCOUNTER — Ambulatory Visit (INDEPENDENT_AMBULATORY_CARE_PROVIDER_SITE_OTHER): Payer: Medicare Other | Admitting: Rehabilitative and Restorative Service Providers"

## 2021-10-23 ENCOUNTER — Other Ambulatory Visit: Payer: Self-pay

## 2021-10-23 ENCOUNTER — Encounter: Payer: Self-pay | Admitting: Rehabilitative and Restorative Service Providers"

## 2021-10-23 DIAGNOSIS — M545 Low back pain, unspecified: Secondary | ICD-10-CM

## 2021-10-23 DIAGNOSIS — R293 Abnormal posture: Secondary | ICD-10-CM | POA: Diagnosis not present

## 2021-10-23 DIAGNOSIS — G8929 Other chronic pain: Secondary | ICD-10-CM

## 2021-10-23 DIAGNOSIS — M6281 Muscle weakness (generalized): Secondary | ICD-10-CM

## 2021-10-23 NOTE — Therapy (Signed)
[facility] Physical Therapy [address] [place], [place], [address] Phone: [phone]   Fax:  [phone]  Physical Therapy Treatment/Progress Note  Patient Details  Name: Charles Robles MRN: [id number] Date of Birth: 05/07/1946 Referring Provider (PT): Leandrew Koyanagi MD  Progress Note Reporting Period 09/27/2021 to 10/23/2021  See note below for Objective Data and Assessment of Progress/Goals.     Encounter Date: 10/23/2021   PT End of Session - 10/23/21 1711     Visit Number 3    Number of Visits 12    Date for PT Re-Evaluation 11/15/21    Progress Note Due on Visit 10    PT Start Time 1430    PT Stop Time 1515    PT Time Calculation (min) 45 min    Activity Tolerance Patient tolerated treatment well;No increased pain    Behavior During Therapy WFL for tasks assessed/performed             Past Medical History:  Diagnosis Date   Abnormal large bowel motility    Arthritis    Basal cell carcinoma 2017   left back bcc tx cx3 38fu   Hearing loss    Heart murmur    Hyperlipidemia    Hypertension    Melanoma ([place])    melanoma per patient years ago   Other fatigue    Pulmonary nodule    a. noted on pre TAVR CT, will need 1 year follow up given smoking history    S/P TAVR (transcatheter aortic valve replacement)    a. s/p TAVR with a 26 mm Edwards sapein 3 Ultra valve via the TF approach on 07/12/19   Severe aortic stenosis    a. s/p TAVR with a 26 mm Edwards sapein 3 Ultra valve via the TF approach on 07/12/19   Stool incontinence    Urgency of urination    Vision loss of right eye     Past Surgical History:  Procedure Laterality Date   COLONOSCOPY  2017   EYE SURGERY  1981    Right eye- corneal transplant   HERNIA REPAIR     x3   POLYPECTOMY     RIGHT/LEFT HEART CATH AND CORONARY ANGIOGRAPHY N/A 06/23/2018   Procedure: RIGHT/LEFT HEART CATH AND CORONARY ANGIOGRAPHY;  Surgeon: Burnell Blanks, MD;  Location: [place]  CV LAB;  Service: Cardiovascular;  Laterality: N/A;   TEE WITHOUT CARDIOVERSION N/A 07/12/2019   Procedure: TRANSESOPHAGEAL ECHOCARDIOGRAM (TEE);  Surgeon: Burnell Blanks, MD;  Location: [place];  Service: Open Heart Surgery;  Laterality: N/A;   TONSILLECTOMY     TRANSCATHETER AORTIC VALVE REPLACEMENT, TRANSFEMORAL N/A 07/12/2019   Procedure: TRANSCATHETER AORTIC VALVE REPLACEMENT, TRANSFEMORAL;  Surgeon: Burnell Blanks, MD;  Location: [place];  Service: Open Heart Surgery;  Laterality: N/A;    There were no vitals filed for this visit.   Subjective Assessment - 10/23/21 1435     Subjective Charles Robles is taking tylenol about 2X/day.  Charles Robles reports improvements in his low back pain and no "terrible" pain.  Some soreness but no "terrible" pain.    Pertinent History HTN, Chronic kidney disease, previous heart procedures    Limitations House hold activities;Lifting    How long can you sit comfortably? An hour    How long can you stand comfortably? Depends on activity, flexion is worst    How long can you walk comfortably? Walks dogs, not walking for exercise    Patient Stated Goals Reduce low back pain  Currently in Pain? Yes    Pain Score 2     Pain Location Back    Pain Orientation Lower    Pain Descriptors / Indicators Aching;Constant    Pain Type Chronic pain    Pain Radiating Towards Weakness    Pain Onset More than a month ago    Pain Frequency Constant    Aggravating Factors  Flexion and late day activities    Pain Relieving Factors Change of position and tylenol    Effect of Pain on Daily Activities Limits endurance with ADLs    Multiple Pain Sites No                [facility] PT Assessment - 10/23/21 0001       ROM / Strength   AROM / PROM / Strength AROM;Strength      AROM   Overall AROM  Deficits    AROM Assessment Site Lumbar;Hip    Right/Left Hip Left;Right    Right Hip Flexion 90    Right Hip External Rotation  35    Right Hip Internal Rotation  8     Left Hip Flexion 90    Left Hip External Rotation  38    Left Hip Internal Rotation  2    Lumbar Extension 10      Flexibility   Soft Tissue Assessment /Muscle Length yes    Hamstrings 35 degrees B                           [facility] Adult PT Treatment/Exercise - 10/23/21 0001       Posture/Postural Control   Posture/Postural Control Postural limitations    Postural Limitations Forward head;Rounded Shoulders;Decreased lumbar lordosis      Therapeutic Activites    Therapeutic Activities ADL's    ADL's Review of body mechanics (log roll) and lifting techniques (sit to stand)      Exercises   Exercises Lumbar      Lumbar Exercises: Stretches   Active Hamstring Stretch Left;Right;4 reps;20 seconds    Single Knee to Chest Stretch Left;Right;4 reps;20 seconds    Figure 4 Stretch 4 reps;20 seconds    Other Lumbar Stretch Exercise Standing trunk/hip extension (push hips forward) 10X 3 seconds      Lumbar Exercises: Standing   Other Standing Lumbar Exercises Shoulder blade pinches 10X 3 seconds      Lumbar Exercises: Seated   Sit to Stand 5 reps;Limitations    Sit to Stand Limitations slow eccentrics      Lumbar Exercises: Prone   Straight Leg Raise 10 reps;3 seconds                     PT Education - 10/23/21 1708     Education Details Reviewed body mechanics with ADLs (lifting and bed mobility), HEP and RA findings.    Person(s) Educated Patient    Methods Explanation;Demonstration;Verbal cues;Tactile cues;Handout    Comprehension Verbalized understanding;Tactile cues required;Need further instruction;Returned demonstration;Verbal cues required              PT Short Term Goals - 10/23/21 1503       PT SHORT TERM GOAL #1   Title Improve trunk extension AROM to 10 degrees.    Baseline 5 degrees at evaluation (10 at RA 10/23/2021)    Time 4    Period Weeks    Status Achieved    Target Date 10/25/21  PT Long Term Goals  - 10/23/21 1709       PT LONG TERM GOAL #1   Title Improve FOTO to 73.    Baseline 54 at evaluation, 68 on 10/23/2021 visit    Time 7    Period Weeks    Status On-going    Target Date 11/15/21      PT LONG TERM GOAL #2   Title Charles Robles will report low back pain consistently 0-3/10 on the Numeric Pain Rating Scale.    Baseline Was 4-5/10    Time 7    Period Weeks    Status Achieved    Target Date 11/15/21      PT LONG TERM GOAL #3   Title Improve flexibility for hip flexors to 100; hamstrings to 45 and hip ER to 40 degrees.    Baseline 85; 30/35 and 25/32 respectively at evaluation, 90; 35 and 2-8 at RA 10/23/2021    Time 7    Period Weeks    Status On-going    Target Date 11/15/21      PT LONG TERM GOAL #4   Title Improve spine/core strength as assessed by FOTO and subjective self-report.    Baseline Late day fatigue is limiting function.    Time 7    Period Weeks    Status On-going    Target Date 11/15/21      PT LONG TERM GOAL #5   Title Charles Robles will be independent with his long-term HEP at DC.    Baseline Started 09/27/2021    Time 7    Period Weeks    Status On-going    Target Date 11/15/21                   Plan - 10/23/21 1711     Clinical Impression Statement Charles Robles is making early progress with only 3 PT visits.  Consistency with the HEP needs to improve as does core and leg strength.  Postural awareness and body mechanics are progressing along with lumbar AROM and B hip flexibility (although all need continued attention).  I expect Charles Robles to meet LTGs by the end of 2022 with continued supervised PT.    Personal Factors and Comorbidities Comorbidity 3+    Comorbidities HTN, Chronic kidney disease, previous heart procedures    Examination-Activity Limitations Stand;Bed Mobility;Bend;Transfers;Lift    Examination-Participation Restrictions Yard Work;Cleaning;Community Activity    Stability/Clinical Decision Making Stable/Uncomplicated    Clinical Decision  Making Low    Rehab Potential Good    PT Frequency --   1-2X/week   PT Duration Other (comment)   7 weeks   PT Treatment/Interventions ADLs/Self Care Home Management;Cryotherapy;Moist Heat;Traction;Therapeutic activities;Neuromuscular re-education;Therapeutic exercise;Patient/family education;Manual techniques;Dry needling    PT Next Visit Plan Practical body mechanics, pull to chest, additional quadriceps and low back strengthening    PT Home Exercise Plan Access Code: [email or web address]    Consulted and Agree with Plan of Care Patient             Patient will benefit from skilled therapeutic intervention in order to improve the following deficits and impairments:  Decreased activity tolerance, Decreased endurance, Decreased range of motion, Decreased strength, Impaired flexibility, Impaired perceived functional ability, Improper body mechanics, Postural dysfunction, Pain  Visit Diagnosis: Abnormal posture  Muscle weakness (generalized)  Chronic midline low back pain, unspecified whether sciatica present     Problem List Patient Active Problem List   Diagnosis Date Noted   BPH associated with nocturia 09/17/2021   Sensation  of chest tightness 09/17/2021   Acute bilateral low back pain with bilateral sciatica 09/17/2021   Abnormal chest x-ray with multiple lung nodules 09/17/2021   Need for immunization against influenza 09/17/2021   Spinal stenosis of lumbar region with neurogenic claudication 09/17/2021   Chronic hyperglycemia 05/16/2021   Hypertriglyceridemia 05/16/2021   Vitamin B12 deficiency anemia due to intrinsic factor deficiency 05/16/2021   Multiple lacunar infarcts ([place]) 05/10/2021   Gastroesophageal reflux disease with esophagitis and hemorrhage 04/25/2021   Stage 3a chronic kidney disease ([place]) 04/24/2021   S/P TAVR (transcatheter aortic valve replacement) 07/12/2019   Pulmonary nodule    Dyslipidemia, goal LDL below 160 01/17/2016   Severe aortic stenosis 05/23/2014    HTN (hypertension) 02/12/2012    Farley Ly, PT, MPT 10/23/2021, 5:14 PM  [facility] Physical Therapy [address] [place], [place], [address] Phone: [phone]   Fax:  [phone]  Name: Charles Robles MRN: [id number] Date of Birth: 03/11/1946

## 2021-10-23 NOTE — Patient Instructions (Signed)
Access Code: [email or web address] URL: [email or web address] Date: 10/23/2021 Prepared by: Vista Mink  Exercises Standing Scapular Retraction - 5 x daily - 7 x weekly - 1 sets - 5 reps - 5 second hold Standing Lumbar Extension at Wall - Forearms - 5 x daily - 7 x weekly - 1 sets - 5 reps - 3 seconds hold Single Knee to Chest Stretch - 2 x daily - 7 x weekly - 1 sets - 4-5 reps - 20 seconds hold Supine Hamstring Stretch - 2 x daily - 7 x weekly - 1 sets - 4-5 reps - 20 seconds hold Supine Figure 4 Piriformis Stretch - 2 x daily - 7 x weekly - 1 sets - 5 reps - 20 seconds hold Prone Hip Extension - 1 x daily - 7 x weekly - 2-3 sets - 10 reps - 3 seconds hold Sit to Stand with Armchair - 5 x daily - 7 x weekly - 1 sets - 1 reps

## 2021-10-25 ENCOUNTER — Encounter: Payer: Medicare Other | Admitting: Rehabilitative and Restorative Service Providers"

## 2021-10-30 ENCOUNTER — Encounter: Payer: Medicare Other | Admitting: Rehabilitative and Restorative Service Providers"

## 2021-11-01 ENCOUNTER — Other Ambulatory Visit: Payer: Self-pay

## 2021-11-01 ENCOUNTER — Encounter: Payer: Self-pay | Admitting: Rehabilitative and Restorative Service Providers"

## 2021-11-01 ENCOUNTER — Ambulatory Visit (INDEPENDENT_AMBULATORY_CARE_PROVIDER_SITE_OTHER): Payer: Medicare Other | Admitting: Rehabilitative and Restorative Service Providers"

## 2021-11-01 DIAGNOSIS — M6281 Muscle weakness (generalized): Secondary | ICD-10-CM

## 2021-11-01 DIAGNOSIS — M545 Low back pain, unspecified: Secondary | ICD-10-CM

## 2021-11-01 DIAGNOSIS — G8929 Other chronic pain: Secondary | ICD-10-CM

## 2021-11-01 DIAGNOSIS — R293 Abnormal posture: Secondary | ICD-10-CM | POA: Diagnosis not present

## 2021-11-01 NOTE — Patient Instructions (Signed)
Continue current daily HEP with recommendations to return if a refresher or follow-up is requested

## 2021-11-01 NOTE — Therapy (Addendum)
[facility] Physical Therapy [address] [place], [place], [address] Phone: [phone]   Fax:  [phone]  Physical Therapy Treatment/DC  Patient Details  Name: Charles Robles MRN: [id number] Date of Birth: [birth year] Referring Provider (PT): Leandrew Koyanagi MD  PHYSICAL THERAPY DISCHARGE SUMMARY  Visits from Start of Care: 4  Current functional level related to goals / functional outcomes: See note   Remaining deficits: See note   Education / Equipment: Updated HEP   Patient agrees to discharge. Patient goals were partially met. Patient is being discharged due to being pleased with the current functional level.   Encounter Date: 11/01/2021     Past Medical History:  Diagnosis Date   Abnormal large bowel motility    Arthritis    Basal cell carcinoma 2017   left back bcc tx cx3 51f   Hearing loss    Heart murmur    Hyperlipidemia    Hypertension    [place]    melanoma per patient years ago   Other fatigue    Pulmonary nodule    a. noted on pre TAVR CT, will need 1 year follow up given smoking history    S/P TAVR (transcatheter aortic valve replacement)    a. s/p TAVR with a 26 mm Edwards sapein 3 Ultra valve via the TF approach on 07/12/19   Severe aortic stenosis    a. s/p TAVR with a 26 mm Edwards sapein 3 Ultra valve via the TF approach on 07/12/19   Stool incontinence    Urgency of urination    Vision loss of right eye     Past Surgical History:  Procedure Laterality Date   COLONOSCOPY  2017   EYE SURGERY  1981    Right eye- corneal transplant   HERNIA REPAIR     x3   POLYPECTOMY     RIGHT/LEFT HEART CATH AND CORONARY ANGIOGRAPHY N/A 06/23/2018   Procedure: RIGHT/LEFT HEART CATH AND CORONARY ANGIOGRAPHY;  Surgeon: MBurnell Blanks MD;  [facility];  Service: Cardiovascular;  Laterality: N/A;   TEE WITHOUT CARDIOVERSION N/A 07/12/2019   Procedure: TRANSESOPHAGEAL ECHOCARDIOGRAM (TEE);   Surgeon: MBurnell Blanks MD;  [place]  Service: Open Heart Surgery;  Laterality: N/A;   TONSILLECTOMY     TRANSCATHETER AORTIC VALVE REPLACEMENT, TRANSFEMORAL N/A 07/12/2019   Procedure: TRANSCATHETER AORTIC VALVE REPLACEMENT, TRANSFEMORAL;  Surgeon: MBurnell Blanks MD;  [place]  Service: Open Heart Surgery;  Laterality: N/A;    There were no vitals filed for this visit.                                    PT Short Term Goals - 10/23/21 1503       PT SHORT TERM GOAL #1   Title Improve trunk extension AROM to 10 degrees.    Baseline 5 degrees at evaluation (10 at RA 10/23/2021)    Time 4    Period Weeks    Status Achieved    Target Date 10/25/21               PT Long Term Goals - 10/23/21 1709       PT LONG TERM GOAL #1   Title Improve FOTO to 73.    Baseline 54 at evaluation, 68 on 10/23/2021 visit    Time 7    Period Weeks    Status On-going  Target Date 11/15/21      PT LONG TERM GOAL #2   Title Durene Fruits will report low back pain consistently 0-3/10 on the Numeric Pain Rating Scale.    Baseline Was 4-5/10    Time 7    Period Weeks    Status Achieved    Target Date 11/15/21      PT LONG TERM GOAL #3   Title Improve flexibility for hip flexors to 100; hamstrings to 45 and hip ER to 40 degrees.    Baseline 85; 30/35 and 25/32 respectively at evaluation, 90; 35 and 2-8 at RA 10/23/2021    Time 7    Period Weeks    Status On-going    Target Date 11/15/21      PT LONG TERM GOAL #4   Title Improve spine/core strength as assessed by FOTO and subjective self-report.    Baseline Late day fatigue is limiting function.    Time 7    Period Weeks    Status On-going    Target Date 11/15/21      PT LONG TERM GOAL #5   Title Durene Fruits will be independent with his long-term HEP at DC.    Baseline Started 09/27/2021    Time 7    Period Weeks    Status On-going    Target Date 11/15/21                      Patient will benefit from skilled therapeutic intervention in order to improve the following deficits and impairments:  Decreased activity tolerance, Decreased endurance, Decreased range of motion, Decreased strength, Impaired flexibility, Impaired perceived functional ability, Improper body mechanics, Postural dysfunction, Pain  Visit Diagnosis: Abnormal posture  Muscle weakness (generalized)  Chronic midline low back pain, unspecified whether sciatica present     Problem List Patient Active Problem List   Diagnosis Date Noted   Rash 02/17/2022   Tick bite of right thigh 02/17/2022   Thrombocytopenia ([place]) 02/17/2022   BPH associated with nocturia 09/17/2021   Abnormal chest x-ray with multiple lung nodules 09/17/2021   Need for immunization against influenza 09/17/2021   Spinal stenosis of lumbar region with neurogenic claudication 09/17/2021   Chronic hyperglycemia 05/16/2021   Hypertriglyceridemia 05/16/2021   Vitamin B12 deficiency anemia due to intrinsic factor deficiency 05/16/2021   Multiple lacunar infarcts ([place]) 05/10/2021   Gastroesophageal reflux disease with esophagitis and hemorrhage 04/25/2021   Stage 3a chronic kidney disease ([place]) 04/24/2021   S/P TAVR (transcatheter aortic valve replacement) 07/12/2019   Pulmonary nodule    Dyslipidemia, goal LDL below 160 01/17/2016   Severe aortic stenosis 05/23/2014   HTN (hypertension) 02/12/2012    Farley Ly, PT, MPT 06/05/2022, 9:55 AM  [facility] Physical Therapy [address] [place], [place], [address] Phone: [phone]   Fax:  [phone]  Name: Charles Robles MRN: [id number] Date of Birth: 08-01-1946

## 2021-11-08 ENCOUNTER — Other Ambulatory Visit: Payer: Self-pay

## 2021-11-08 ENCOUNTER — Ambulatory Visit (INDEPENDENT_AMBULATORY_CARE_PROVIDER_SITE_OTHER): Payer: Medicare Other

## 2021-11-08 DIAGNOSIS — D510 Vitamin B12 deficiency anemia due to intrinsic factor deficiency: Secondary | ICD-10-CM | POA: Diagnosis not present

## 2021-11-08 MED ORDER — CYANOCOBALAMIN 1000 MCG/ML IJ SOLN
1000.0000 ug | Freq: Once | INTRAMUSCULAR | Status: AC
  Administered 2021-11-08: 10:00:00 1000 ug via INTRAMUSCULAR

## 2021-11-08 NOTE — Progress Notes (Signed)
B12 given Please cosign 

## 2021-11-21 ENCOUNTER — Encounter: Payer: Medicare Other | Admitting: Rehabilitative and Restorative Service Providers"

## 2021-12-06 ENCOUNTER — Other Ambulatory Visit: Payer: Self-pay | Admitting: Internal Medicine

## 2021-12-06 ENCOUNTER — Telehealth: Payer: Self-pay | Admitting: Internal Medicine

## 2021-12-06 DIAGNOSIS — I10 Essential (primary) hypertension: Secondary | ICD-10-CM

## 2021-12-06 DIAGNOSIS — E785 Hyperlipidemia, unspecified: Secondary | ICD-10-CM

## 2021-12-06 MED ORDER — OLMESARTAN MEDOXOMIL 20 MG PO TABS: 20.0000 mg | ORAL_TABLET | Freq: Every day | ORAL | 1 refills | Status: DC

## 2021-12-06 MED ORDER — ROSUVASTATIN CALCIUM 10 MG PO TABS: 10.0000 mg | ORAL_TABLET | Freq: Every day | ORAL | 1 refills | Status: DC

## 2021-12-06 NOTE — Telephone Encounter (Signed)
1.Medication Requested: rosuvastatin (CRESTOR) 10 MG tablet olmesartan (BENICAR) 20 MG tablet   2. Pharmacy (Name, Street, [place]): [facility] [phone] - [address], [place] [facility] [address]  Phone:  [phone] Fax:  [phone]   3. On Med List: yes  4. Last Visit with PCP: 11.01.22  5. Next visit date with PCP: n/a   Agent: Please be advised that RX refills may take up to 3 business days. We ask that you follow-up with your pharmacy.

## 2021-12-12 ENCOUNTER — Other Ambulatory Visit: Payer: Self-pay

## 2021-12-12 ENCOUNTER — Ambulatory Visit: Payer: Medicare Other

## 2021-12-12 ENCOUNTER — Ambulatory Visit (INDEPENDENT_AMBULATORY_CARE_PROVIDER_SITE_OTHER): Payer: Medicare Other

## 2021-12-12 DIAGNOSIS — D510 Vitamin B12 deficiency anemia due to intrinsic factor deficiency: Secondary | ICD-10-CM | POA: Diagnosis not present

## 2021-12-12 MED ORDER — CYANOCOBALAMIN 1000 MCG/ML IJ SOLN
1000.0000 ug | Freq: Once | INTRAMUSCULAR | Status: AC
  Administered 2021-12-12: 1000 ug via INTRAMUSCULAR

## 2021-12-12 NOTE — Progress Notes (Signed)
Pt was given B12 w/o any complications. 

## 2022-01-15 ENCOUNTER — Other Ambulatory Visit: Payer: Self-pay

## 2022-01-15 ENCOUNTER — Ambulatory Visit (INDEPENDENT_AMBULATORY_CARE_PROVIDER_SITE_OTHER): Payer: Medicare Other

## 2022-01-15 DIAGNOSIS — D510 Vitamin B12 deficiency anemia due to intrinsic factor deficiency: Secondary | ICD-10-CM | POA: Diagnosis not present

## 2022-01-15 MED ORDER — CYANOCOBALAMIN 1000 MCG/ML IJ SOLN
1000.0000 ug | Freq: Once | INTRAMUSCULAR | Status: AC
  Administered 2022-01-15: 1000 ug via INTRAMUSCULAR

## 2022-01-15 NOTE — Progress Notes (Signed)
Pt was given E94 w/o complications. ?

## 2022-01-29 DIAGNOSIS — Z20822 Contact with and (suspected) exposure to covid-19: Secondary | ICD-10-CM | POA: Diagnosis not present

## 2022-02-03 ENCOUNTER — Encounter: Payer: Self-pay | Admitting: Family Medicine

## 2022-02-03 ENCOUNTER — Ambulatory Visit (INDEPENDENT_AMBULATORY_CARE_PROVIDER_SITE_OTHER): Payer: Medicare Other | Admitting: Family Medicine

## 2022-02-03 VITALS — BP 108/68 | HR 79 | Temp 98.6°F | Ht 69.0 in | Wt 164.0 lb

## 2022-02-03 DIAGNOSIS — R21 Rash and other nonspecific skin eruption: Secondary | ICD-10-CM | POA: Diagnosis not present

## 2022-02-03 LAB — CBC WITH DIFFERENTIAL/PLATELET
Basophils Absolute: 0 10*3/uL (ref 0.0–0.1)
Basophils Relative: 0.5 % (ref 0.0–3.0)
Eosinophils Absolute: 0 10*3/uL (ref 0.0–0.7)
Eosinophils Relative: 0.2 % (ref 0.0–5.0)
HCT: 39 % (ref 39.0–52.0)
Hemoglobin: 13 g/dL (ref 13.0–17.0)
Lymphocytes Relative: 12.4 % (ref 12.0–46.0)
Lymphs Abs: 0.5 10*3/uL — ABNORMAL LOW (ref 0.7–4.0)
MCHC: 33.3 g/dL (ref 30.0–36.0)
MCV: 94.4 fl (ref 78.0–100.0)
Monocytes Absolute: 0.4 10*3/uL (ref 0.1–1.0)
Monocytes Relative: 10.1 % (ref 3.0–12.0)
Neutro Abs: 3.3 10*3/uL (ref 1.4–7.7)
Neutrophils Relative %: 76.8 % (ref 43.0–77.0)
Platelets: 122 10*3/uL — ABNORMAL LOW (ref 150.0–400.0)
RBC: 4.13 Mil/uL — ABNORMAL LOW (ref 4.22–5.81)
RDW: 13.6 % (ref 11.5–15.5)
WBC: 4.3 10*3/uL (ref 4.0–10.5)

## 2022-02-03 MED ORDER — DOXYCYCLINE HYCLATE 100 MG PO CAPS: 100.0000 mg | ORAL_CAPSULE | Freq: Two times a day (BID) | ORAL | 0 refills | Status: AC

## 2022-02-03 NOTE — Progress Notes (Signed)
? ?  Subjective:  ? ? Patient ID: Charles Robles, male    DOB: 1946/10/13, 76 y.o.   MRN: [id number] ? ?HPI ?Here with his daughter for symptoms after an insect bite. He was in his backyard about 2 weeks ago when he felt a mild burning sensation on this right inner thigh. When he looked down he saw a tiny insect which he thinks was a tick. He pulled it out with tweezers and for the first few days the site looked fine. Then he developed a red rash that started on the right leg and then spread to both legs and the trunk. The rash is asymptomatic. He has felt generalized fatigue in the past week and weakness, as well as somemild confusion. No muscle or joint aches, no fever, no headaches.  ? ? ?Review of Systems  ?Constitutional:  Positive for fatigue. Negative for chills, diaphoresis and fever.  ?Respiratory: Negative.    ?Cardiovascular: Negative.   ?Gastrointestinal: Negative.   ?Genitourinary: Negative.   ?Musculoskeletal:  Positive for arthralgias.  ?Skin:  Positive for rash.  ?Neurological: Negative.   ?Psychiatric/Behavioral:  Positive for confusion.   ? ?   ?Objective:  ? Physical Exam ?Constitutional:   ?   Appearance: Normal appearance. He is not ill-appearing.  ?Cardiovascular:  ?   Rate and Rhythm: Normal rate and regular rhythm.  ?   Pulses: Normal pulses.  ?   Heart sounds: Normal heart sounds.  ?Pulmonary:  ?   Effort: Pulmonary effort is normal.  ?   Breath sounds: Normal breath sounds.  ?Skin: ?   Comments: There is a small punctate bite mark on the right inner thigh. There is also a widespread splotchy macular red rash which is densest over the legs but also involves the abdomen  ?Neurological:  ?   General: No focal deficit present.  ?   Mental Status: He is alert and oriented to person, place, and time.  ? ? ? ? ? ?   ?Assessment & Plan:  ?He has a rash after an insect bite. This could represent an allergic reaction, but we are also concerned about the possibility of Lyme disease. We will  check a CBC and Lyme titers. Treat with 10 days of Doxycycline. He can follow up with his PCP, Dr. Ronnald Ramp.  ?Alysia Penna, MD ? ? ? ?

## 2022-02-04 LAB — B. BURGDORFI ANTIBODIES: B burgdorferi Ab IgG+IgM: <0.9 index

## 2022-02-10 ENCOUNTER — Other Ambulatory Visit: Payer: Self-pay

## 2022-02-10 ENCOUNTER — Encounter: Payer: Self-pay | Admitting: Emergency Medicine

## 2022-02-10 ENCOUNTER — Ambulatory Visit (INDEPENDENT_AMBULATORY_CARE_PROVIDER_SITE_OTHER): Payer: Medicare Other | Admitting: Emergency Medicine

## 2022-02-10 VITALS — BP 122/66 | HR 76 | Temp 98.3°F | Ht 69.0 in | Wt 163.1 lb

## 2022-02-10 DIAGNOSIS — S70361A Insect bite (nonvenomous), right thigh, initial encounter: Secondary | ICD-10-CM | POA: Diagnosis not present

## 2022-02-10 DIAGNOSIS — W57XXXA Bitten or stung by nonvenomous insect and other nonvenomous arthropods, initial encounter: Secondary | ICD-10-CM

## 2022-02-10 NOTE — Progress Notes (Signed)
Charles Robles ?76 y.o. ? ? ?Chief Complaint  ?Patient presents with  ? Insect Bite  ?  Bite by tick 2 weeks ago  , pt wants lab work   ? Rash  ?  Rash arms and legs   ? ? ?HISTORY OF PRESENT ILLNESS: ?Acute problem visit today. ?This is a 76 y.o. male complaining of tick bite sustained 2 weeks ago.  Patient saw a doctor who started him on doxycycline.  Blood work at that time came back negative. ?Patient states site looks better.  Wants blood work repeated. ? ?Rash ?Pertinent negatives include no congestion, cough, fever, joint pain, shortness of breath or vomiting.  ? ? ?Prior to Admission medications   ?Medication Sig Start Date End Date Taking? Authorizing Provider  ?amoxicillin (AMOXIL) 500 MG tablet Take 4 tablets (2,000 mg total) by mouth as directed. Take 4 tablets 1 hour prior to dental work, including cleanings. 07/13/19  Yes Eileen Stanford, PA-C  ?cetirizine (ZYRTEC) 10 MG tablet Take 10 mg by mouth daily as needed for allergies.   Yes [provider]  ?doxycycline (VIBRAMYCIN) 100 MG capsule Take 1 capsule (100 mg total) by mouth 2 (two) times daily for 10 days. 02/03/22 02/13/22 Yes Laurey Morale, MD  ?esomeprazole (NEXIUM) 40 MG capsule Take 1 capsule (40 mg total) by mouth daily. 04/25/21  Yes Janith Lima, MD  ?fish oil-omega-3 fatty acids 1000 MG capsule Take 1 g by mouth daily.   Yes [provider]  ?Glucosamine-Chondroit-Vit C-Mn (GLUCOSAMINE 1500 COMPLEX PO) Take by mouth.   Yes [provider]  ?nystatin cream (MYCOSTATIN) Apply 1 application topically 2 (two) times daily. 08/08/19  Yes Eileen Stanford, PA-C  ?olmesartan (BENICAR) 20 MG tablet Take 1 tablet (20 mg total) by mouth daily. 12/06/21  Yes Janith Lima, MD  ?rosuvastatin (CRESTOR) 10 MG tablet Take 1 tablet (10 mg total) by mouth daily. 12/06/21  Yes Janith Lima, MD  ?sildenafil (VIAGRA) 100 MG tablet Take 1 tablet (100 mg total) by mouth daily as needed for erectile dysfunction.  02/22/21  Yes Jerline Pain, MD  ?sucralfate (CARAFATE) 1 g tablet Take 1 tablet (1 g total) by mouth 4 (four) times daily -  with meals and at bedtime. 08/27/21  Yes Janith Lima, MD  ?tadalafil (CIALIS) 5 MG tablet Take 1 tablet (5 mg total) by mouth daily. 09/17/21  Yes Janith Lima, MD  ? ? ?No Known Allergies ? ?Patient Active Problem List  ? Diagnosis Date Noted  ? BPH associated with nocturia 09/17/2021  ? Sensation of chest tightness 09/17/2021  ? Acute bilateral low back pain with bilateral sciatica 09/17/2021  ? Abnormal chest x-ray with multiple lung nodules 09/17/2021  ? Need for immunization against influenza 09/17/2021  ? Spinal stenosis of lumbar region with neurogenic claudication 09/17/2021  ? Chronic hyperglycemia 05/16/2021  ? Hypertriglyceridemia 05/16/2021  ? Vitamin B12 deficiency anemia due to intrinsic factor deficiency 05/16/2021  ? Multiple lacunar infarcts ([place]) 05/10/2021  ? Gastroesophageal reflux disease with esophagitis and hemorrhage 04/25/2021  ? Stage 3a chronic kidney disease ([place]) 04/24/2021  ? 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 Pulmonary nodule   ? Dyslipidemia, goal LDL below 160 01/17/2016  ? Severe aortic stenosis 05/23/2014  ? HTN (hypertension) 02/12/2012  ? ? ?Past Medical History:  ?Diagnosis Date  ? Abnormal large bowel motility   ? Arthritis   ? Basal cell carcinoma 2017  ? left back bcc  tx cx3 45f  ? Hearing loss   ? Heart murmur   ? Hyperlipidemia   ? Hypertension   ? [place]   ? melanoma per patient years ago  ? Other fatigue   ? Pulmonary nodule   ? a. noted on pre TAVR CT, will need 1 year follow up given smoking history   ? S/P TAVR (transcatheter aortic valve replacement)   ? a. s/p TAVR with a 26 mm Edwards sapein 3 Ultra valve via the TF approach on 07/12/19  ? Severe aortic stenosis   ? a. s/p TAVR with a 26 mm Edwards sapein 3 Ultra valve via the TF approach on 07/12/19  ? Stool incontinence   ? Urgency of urination   ?  Vision loss of right eye   ? ? ?Past Surgical History:  ?Procedure Laterality Date  ? COLONOSCOPY  2017  ? EYE SURGERY  1981  ?  Right eye- corneal transplant  ? HERNIA REPAIR    ? x3  ? POLYPECTOMY    ? RIGHT/LEFT HEART CATH AND CORONARY ANGIOGRAPHY N/A 06/23/2018  ? Procedure: RIGHT/LEFT HEART CATH AND CORONARY ANGIOGRAPHY;  Surgeon: MBurnell Blanks MD;  [facility];  Service: Cardiovascular;  Laterality: N/A;  ? TEE WITHOUT CARDIOVERSION N/A 07/12/2019  ? Procedure: TRANSESOPHAGEAL ECHOCARDIOGRAM (TEE);  Surgeon: MBurnell Blanks MD;  [facility]  Service: Open Heart Surgery;  Laterality: N/A;  ? TONSILLECTOMY    ? TRANSCATHETER AORTIC VALVE REPLACEMENT, TRANSFEMORAL N/A 07/12/2019  ? Procedure: TRANSCATHETER AORTIC VALVE REPLACEMENT, TRANSFEMORAL;  Surgeon: MBurnell Blanks MD;  [facility] [facility]  Service: Open Heart Surgery;  Laterality: N/A;  ? ? ?Social History  ? ?Socioeconomic History  ? Marital status: Divorced  ?  Spouse name: Not on file  ? Number of children: 0  ? Years of education: Not on file  ? Highest education level: Not on file  ?Occupational History  ? Occupation: Retired-Psychologist  ?Tobacco Use  ? Smoking status: Some Days  ?  Types: Cigars  ? Smokeless tobacco: Never  ?Vaping Use  ? Vaping Use: Never used  ?Substance and Sexual Activity  ? Alcohol use: Yes  ?  Alcohol/week: 32.0 standard drinks  ?  Types: 30 Cans of beer, 2 Glasses of wine per week  ? Drug use: Yes  ?  Types: Marijuana  ?  Comment: 1-2 week  ? Sexual activity: Never  ?Other Topics Concern  ? Not on file  ?Social History Narrative  ? Not on file  ? ?Social Determinants of Health  ? ?Financial Resource Strain: Low Risk   ? Difficulty of Paying Living Expenses: Not hard at all  ?Food Insecurity: No Food Insecurity  ? Worried About RCharity fundraiserin the Last Year: Never true  ? Ran Out of Food in the Last Year: Never true  ?Transportation Needs: No Transportation Needs  ? Lack of  Transportation (Medical): No  ? Lack of Transportation (Non-Medical): No  ?Physical Activity: Sufficiently Active  ? Days of Exercise per Week: 5 days  ? Minutes of Exercise per Session: 30 min  ?Stress: No Stress Concern Present  ? Feeling of Stress : Not at all  ?Social Connections: Moderately Integrated  ? Frequency of Communication with Friends and Family: More than three times a week  ? Frequency of Social Gatherings with Friends and Family: More than three times a week  ? Attends Religious Services: 1 to 4 times per year  ?  Active Member of Clubs or Organizations: Yes  ? Attends Archivist Meetings: 1 to 4 times per year  ? Marital Status: Never married  ?Intimate Partner Violence: Not At Risk  ? Fear of Current or Ex-Partner: No  ? Emotionally Abused: No  ? Physically Abused: No  ? Sexually Abused: No  ? ? ?Family History  ?Problem Relation Age of Onset  ? Heart disease Father   ?     MI  ? Hyperlipidemia Father   ? Hypertension Mother   ? Alcohol abuse Mother   ? Hyperlipidemia Brother   ? Hypertension Brother   ? Colon cancer Neg Hx   ? Colon polyps Neg Hx   ? Rectal cancer Neg Hx   ? Stomach cancer Neg Hx   ? Esophageal cancer Neg Hx   ? ? ? ?Review of Systems  ?Constitutional:  Negative for chills and fever.  ?HENT:  Negative for congestion.   ?Respiratory:  Negative for cough and shortness of breath.   ?Cardiovascular:  Negative for chest pain and palpitations.  ?Gastrointestinal:  Negative for nausea and vomiting.  ?Musculoskeletal:  Negative for joint pain and myalgias.  ?Skin:  Positive for rash.  ?Neurological:  Negative for dizziness and headaches.  ? ?Today's Vitals  ? 02/10/22 1408  ?BP: 122/66  ?Pulse: 76  ?Temp: 98.3 ?F (36.8 ?C)  ?TempSrc: Oral  ?SpO2: 97%  ?Weight: 163 lb 2 oz (74 kg)  ?Height: '5\' 9"'$  (1.753 m)  ? ?Body mass index is 24.09 kg/m?. ? ?Physical Exam ?Vitals reviewed.  ?Constitutional:   ?   Appearance: Normal appearance.  ?HENT:  ?   Head: Normocephalic.  ?Eyes:  ?    Extraocular Movements: Extraocular movements intact.  ?   Pupils: Pupils are equal, round, and reactive to light.  ?Cardiovascular:  ?   Rate and Rhythm: Normal rate.  ?Pulmonary:  ?   Effort: Pulmonary effort i

## 2022-02-10 NOTE — Patient Instructions (Signed)
Lyme Disease Lyme disease is an infection that can affect many parts of the body, including the skin, joints, and nervous system. It is a bacterial infection that starts from the bite of an infected tick. Over time, the infection can worsen, and some of the symptoms are similar to the flu. If Lyme disease is not treated, it may cause joint pain, swelling, numbness, problems thinking, fatigue, muscle weakness, and other problems. What are the causes? This condition is caused by bacteria called Borrelia burgdorferi. You can get Lyme disease by being bitten by an infected tick. Only black-legged, or Ixodes, ticks that are infected with the bacteria can cause Lyme disease. The tick must be attached to your skin for a certain period of time to pass along the infection. This is usually 36-48 hours. Deer often carry infected ticks. What increases the risk? The following factors may make you more likely to develop this condition: Living in or visiting these areas in the [place]. [place]. [place]. Spending time in wooded or grassy areas. Being outdoors with exposed skin. Camping, gardening, hiking, fishing, hunting, or working outdoors. Failing to remove a tick from your skin. What are the signs or symptoms? Symptoms of this condition may include: Chills and fever. Headache. Fatigue. General achiness. Muscle pain. Joint pain, often in the knees. A round, red rash that surrounds the center of the tick bite. The center of the rash may be blood colored or have tiny blisters. Swollen lymph glands. Stiff neck. How is this diagnosed? This condition is diagnosed based on: Your symptoms and medical history. A physical exam. A blood test. How is this treated? The main treatment for this condition is antibiotic medicine, which is usually taken by mouth (orally). The length of treatment depends on how soon after a tick bite you begin taking the medicine. In some  cases, treatment is necessary for several weeks. If the infection is severe, antibiotics may need to be given through an IV that is inserted into one of your veins. Follow these instructions at home: Take over-the-counter and prescription medicines only as told by your health care provider. Finish all antibiotic medicine, even when you start to feel better. Ask your health care provider about taking a probiotic in between doses of your antibiotic to help avoid an upset stomach or diarrhea. Check with your health care provider before supplementing your treatment. Many alternative therapies have not been proven and may be harmful to you. Keep all follow-up visits as told by your health care provider. This is important. How is this prevented? You can become reinfected if you get another tick bite from an infected tick. Take these steps to help prevent an infection: Cover your skin with light-colored clothing when you are outdoors in the spring and summer months. Spray clothing and skin with bug spray. The spray should be 20-30% DEET. You can also treat clothing with permethrin, and let it dry before you wear it. Do not apply permethrin directly to your skin. Permethrin can also be used to treat camping gear and boots. Always read and follow the instructions that come with a bug spray or insecticide. Avoid wooded, grassy, and shaded areas. Remove yard litter, brush, trash, and plants that attract deer and rodents. Check yourself for ticks when you come indoors. Wash clothing worn each day. Shower after spending time outdoors. Check your pets for ticks before they come inside. If you find a tick attached to your skin: Remove it with tweezers.   Clean your hands and the bite area with rubbing alcohol or soap and water. ?Dispose of the tick by putting it in rubbing alcohol, putting it in a sealed bag or container, or flushing it down the toilet. ?You may choose to save the tick in a sealed container if you  wish for it to be tested at a later time. ?Pregnant women should take special care to avoid tick bites because it is possible that the infection may be passed along to the fetus. ?Contact a health care provider if: ?You have symptoms after treatment. ?You have removed a tick and want to bring it to your health care provider for testing. ?Get help right away if: ?You have an irregular heartbeat. ?You have chest pain. ?You have nerve pain. ?Your face feels numb. ?You develop the following: ?A stiff neck. ?A severe headache. ?Severe nausea and vomiting. ?Sensitivity to light. ?Summary ?Lyme disease is an infection that can affect many parts of the body, including the skin, joints, and nervous system. ?This condition is caused by bacteria called Borrelia burgdorferi. ?You can get Lyme disease by being bitten by an infected tick. ?The main treatment for this condition is antibiotic medicine. ?This information is not intended to replace advice given to you by your health care provider. Make sure you discuss any questions you have with your health care provider. ?Document Revised: 02/25/2019 Document Reviewed: 01/20/2019 ?Elsevier Patient Education ? [place]. ? ?

## 2022-02-11 LAB — LYME DISEASE SEROLOGY W/REFLEX: Lyme Total Antibody EIA: NEGATIVE

## 2022-02-17 ENCOUNTER — Ambulatory Visit (INDEPENDENT_AMBULATORY_CARE_PROVIDER_SITE_OTHER): Payer: Medicare Other | Admitting: Internal Medicine

## 2022-02-17 ENCOUNTER — Ambulatory Visit: Payer: Medicare Other

## 2022-02-17 ENCOUNTER — Encounter: Payer: Self-pay | Admitting: Internal Medicine

## 2022-02-17 VITALS — BP 128/76 | HR 77 | Temp 98.0°F | Ht 69.0 in | Wt 161.0 lb

## 2022-02-17 DIAGNOSIS — S70361S Insect bite (nonvenomous), right thigh, sequela: Secondary | ICD-10-CM | POA: Diagnosis not present

## 2022-02-17 DIAGNOSIS — W57XXXS Bitten or stung by nonvenomous insect and other nonvenomous arthropods, sequela: Secondary | ICD-10-CM | POA: Diagnosis not present

## 2022-02-17 DIAGNOSIS — R21 Rash and other nonspecific skin eruption: Secondary | ICD-10-CM | POA: Diagnosis not present

## 2022-02-17 DIAGNOSIS — D510 Vitamin B12 deficiency anemia due to intrinsic factor deficiency: Secondary | ICD-10-CM | POA: Diagnosis not present

## 2022-02-17 DIAGNOSIS — N1831 Chronic kidney disease, stage 3a: Secondary | ICD-10-CM

## 2022-02-17 DIAGNOSIS — D696 Thrombocytopenia, unspecified: Secondary | ICD-10-CM | POA: Insufficient documentation

## 2022-02-17 DIAGNOSIS — W57XXXA Bitten or stung by nonvenomous insect and other nonvenomous arthropods, initial encounter: Secondary | ICD-10-CM | POA: Insufficient documentation

## 2022-02-17 LAB — CBC WITH DIFFERENTIAL/PLATELET
Basophils Absolute: 0 10*3/uL (ref 0.0–0.1)
Basophils Relative: 0.8 % (ref 0.0–3.0)
Eosinophils Absolute: 0.1 10*3/uL (ref 0.0–0.7)
Eosinophils Relative: 1.6 % (ref 0.0–5.0)
HCT: 39.5 % (ref 39.0–52.0)
Hemoglobin: 13.1 g/dL (ref 13.0–17.0)
Lymphocytes Relative: 39.5 % (ref 12.0–46.0)
Lymphs Abs: 1.9 10*3/uL (ref 0.7–4.0)
MCHC: 33.2 g/dL (ref 30.0–36.0)
MCV: 95.3 fl (ref 78.0–100.0)
Monocytes Absolute: 0.6 10*3/uL (ref 0.1–1.0)
Monocytes Relative: 12.2 % — ABNORMAL HIGH (ref 3.0–12.0)
Neutro Abs: 2.2 10*3/uL (ref 1.4–7.7)
Neutrophils Relative %: 45.9 % (ref 43.0–77.0)
Platelets: 184 10*3/uL (ref 150.0–400.0)
RBC: 4.14 Mil/uL — ABNORMAL LOW (ref 4.22–5.81)
RDW: 14.2 % (ref 11.5–15.5)
WBC: 4.9 10*3/uL (ref 4.0–10.5)

## 2022-02-17 LAB — C-REACTIVE PROTEIN: CRP: <1 mg/dL (ref 0.5–20.0)

## 2022-02-17 LAB — FOLATE: Folate: 12.8 ng/mL (ref >5.9)

## 2022-02-17 MED ORDER — CYANOCOBALAMIN 1000 MCG/ML IJ SOLN
1000.0000 ug | Freq: Once | INTRAMUSCULAR | Status: AC
  Administered 2022-02-17: 1000 ug via INTRAMUSCULAR

## 2022-02-17 NOTE — Patient Instructions (Signed)
Rash, Adult °A rash is a change in the color of your skin. A rash can also change the way your skin feels. There are many different conditions and factors that can cause a rash. Some rashes may disappear after a few days, but some may last for a few weeks. Common causes of rashes include: °Viral infections, such as: °Colds. °Measles. °Hand, foot, and mouth disease. °Bacterial infections, such as: °Scarlet fever. °Impetigo. °Fungal infections, such as Candida. °Allergic reactions to food, medicines, or skin care products. °Follow these instructions at home: °The goal of treatment is to stop the itching and keep the rash from spreading. Pay attention to any changes in your symptoms. Follow these instructions to help with your condition: °Medicine °Take or apply over-the-counter and prescription medicines only as told by your health care provider. These may include: °Corticosteroid creams to treat red or swollen skin. °Anti-itch lotions. °Oral allergy medicines (antihistamines). °Oral corticosteroids for severe symptoms. ° °Skin care °Apply cool compresses to the affected areas. °Do not scratch or rub your skin. °Avoid covering the rash. Make sure the rash is exposed to air as much as possible. °Managing itching and discomfort °Avoid hot showers or baths, which can make itching worse. A cold shower may help. °Try taking a bath with: °Epsom salts. Follow manufacturer instructions on the packaging. You can get these at your local pharmacy or grocery store. °Baking soda. Pour a small amount into the bath as told by your health care provider. °Colloidal oatmeal. Follow manufacturer instructions on the packaging. You can get this at your local pharmacy or grocery store. °Try applying baking soda paste to your skin. Stir water into baking soda until it reaches a paste-like consistency. °Try applying calamine lotion. This is an over-the-counter lotion that helps to relieve itchiness. °Keep cool and out of the sun. Sweating  and being hot can make itching worse. °General instructions ° °Rest as needed. °Drink enough fluid to keep your urine pale yellow. °Wear loose-fitting clothing. °Avoid scented soaps, detergents, and perfumes. Use gentle soaps, detergents, perfumes, and other cosmetic products. °Avoid any substance that causes your rash. Keep a journal to help track what causes your rash. Write down: °What you eat. °What cosmetic products you use. °What you drink. °What you wear. This includes jewelry. °Keep all follow-up visits as told by your health care provider. This is important. °Contact a health care provider if: °You sweat at night. °You lose weight. °You urinate more than normal. °You urinate less than normal, or you notice that your urine is a darker color than usual. °You feel weak. °You vomit. °Your skin or the whites of your eyes look yellow (jaundice). °Your skin: °Tingles. °Is numb. °Your rash: °Does not go away after several days. °Gets worse. °You are: °Unusually thirsty. °More tired than normal. °You have: °New symptoms. °Pain in your abdomen. °A fever. °Diarrhea. °Get help right away if you: °Have a fever and your symptoms suddenly get worse. °Develop confusion. °Have a severe headache or a stiff neck. °Have severe joint pains or stiffness. °Have a seizure. °Develop a rash that covers all or most of your body. The rash may or may not be painful. °Develop blisters that: °Are on top of the rash. °Grow larger or grow together. °Are painful. °Are inside your nose or mouth. °Develop a rash that: °Looks like purple pinprick-sized spots all over your body. °Has a "bull's eye" or looks like a target. °Is not related to sun exposure, is red and painful, and causes   your skin to peel. °Summary °A rash is a change in the color of your skin. Some rashes disappear after a few days, but some may last for a few weeks. °The goal of treatment is to stop the itching and keep the rash from spreading. °Take or apply over-the-counter  and prescription medicines only as told by your health care provider. °Contact a health care provider if you have new or worsening symptoms. °Keep all follow-up visits as told by your health care provider. This is important. °This information is not intended to replace advice given to you by your health care provider. Make sure you discuss any questions you have with your health care provider. °Document Revised: 02/25/2019 Document Reviewed: 06/07/2018 °Elsevier Patient Education © 2022 [employer] ° °

## 2022-02-17 NOTE — Progress Notes (Signed)
? ?Subjective:  ?Patient ID: Charles Robles, male    DOB: [birth year]  Age: 76 y.o. MRN: [id number] ? ?CC: Rash ? ? ?HPI ?Charles Robles presents for f/up - ? ?He recently sustained a tick bite on his right thigh.  He was seen elsewhere and treated with doxycycline.  He has arthralgias.  He has a red rash on his leg that does not cause any symptoms.  He denies fever, chills, night sweats, or lymphadenopathy.  His testing for Lyme disease was negative. ? ?Outpatient Medications Prior to Visit  ?Medication Sig Dispense Refill  ? amoxicillin (AMOXIL) 500 MG tablet Take 4 tablets (2,000 mg total) by mouth as directed. Take 4 tablets 1 hour prior to dental work, including cleanings. 12 tablet 12  ? cetirizine (ZYRTEC) 10 MG tablet Take 10 mg by mouth daily as needed for allergies.    ? esomeprazole (NEXIUM) 40 MG capsule Take 1 capsule (40 mg total) by mouth daily. 90 capsule 1  ? fish oil-omega-3 fatty acids 1000 MG capsule Take 1 g by mouth daily.    ? Glucosamine-Chondroit-Vit C-Mn (GLUCOSAMINE 1500 COMPLEX PO) Take by mouth.    ? nystatin cream (MYCOSTATIN) Apply 1 application topically 2 (two) times daily. 30 g 1  ? olmesartan (BENICAR) 20 MG tablet Take 1 tablet (20 mg total) by mouth daily. 90 tablet 1  ? rosuvastatin (CRESTOR) 10 MG tablet Take 1 tablet (10 mg total) by mouth daily. 90 tablet 1  ? sildenafil (VIAGRA) 100 MG tablet Take 1 tablet (100 mg total) by mouth daily as needed for erectile dysfunction. 10 tablet 3  ? sucralfate (CARAFATE) 1 g tablet Take 1 tablet (1 g total) by mouth 4 (four) times daily -  with meals and at bedtime. 360 tablet 0  ? tadalafil (CIALIS) 5 MG tablet Take 1 tablet (5 mg total) by mouth daily. 90 tablet 1  ? ?No facility-administered medications prior to visit.  ? ? ?ROS ?Review of Systems  ?Constitutional: Negative.  Negative for chills, fatigue and fever.  ?HENT: Negative.    ?Eyes: Negative.   ?Respiratory:  Negative for cough, chest tightness and wheezing.    ?Cardiovascular:  Negative for chest pain, palpitations and leg swelling.  ?Gastrointestinal:  Negative for abdominal pain, constipation, diarrhea, nausea and vomiting.  ?Endocrine: Negative.   ?Genitourinary: Negative.   ?Musculoskeletal:  Positive for arthralgias. Negative for back pain and myalgias.  ?Skin:  Positive for rash. Negative for color change.  ?Neurological: Negative.  Negative for dizziness, weakness, light-headedness and headaches.  ?Hematological:  Negative for adenopathy. Does not bruise/bleed easily.  ?Psychiatric/Behavioral: Negative.    ? ?Objective:  ?BP 128/76 (BP Location: Right Arm, Patient Position: Sitting, Cuff Size: Large)   Pulse 77   Temp 98 ?F (36.7 ?C) (Oral)   Ht '5\' 9"'$  (1.753 m)   Wt 161 lb (73 kg)   SpO2 99%   BMI 23.78 kg/m?  ? ?BP Readings from Last 3 Encounters:  ?02/17/22 128/76  ?02/10/22 122/66  ?02/03/22 108/68  ? ? ?Wt Readings from Last 3 Encounters:  ?02/17/22 161 lb (73 kg)  ?02/10/22 163 lb 2 oz (74 kg)  ?02/03/22 164 lb (74.4 kg)  ? ? ?Physical Exam ?Vitals reviewed.  ?HENT:  ?   Mouth/Throat:  ?   Mouth: Mucous membranes are moist.  ?Eyes:  ?   General: No scleral icterus. ?   Conjunctiva/sclera: Conjunctivae normal.  ?Cardiovascular:  ?   Rate and Rhythm: Normal rate.  ?  Heart sounds: Murmur heard.  ?  No gallop.  ?Pulmonary:  ?   Effort: Pulmonary effort is normal.  ?   Breath sounds: No stridor. No wheezing, rhonchi or rales.  ?Abdominal:  ?   General: Abdomen is flat.  ?   Palpations: There is no mass.  ?   Tenderness: There is no abdominal tenderness. There is no guarding.  ?   Hernia: No hernia is present.  ?Musculoskeletal:     ?   General: Normal range of motion.  ?   Cervical back: Neck supple.  ?   Right lower leg: No edema.  ?Skin: ?   General: Skin is warm.  ?   Findings: Erythema and rash present.  ?   Comments: Blanching erythematous macules over the extermities  ?Neurological:  ?   General: No focal deficit present.  ?   Mental Status: He is  alert.  ?Psychiatric:     ?   Mood and Affect: Mood normal.     ?   Behavior: Behavior normal.  ? ? ?Lab Results  ?Component Value Date  ? WBC 4.9 02/17/2022  ? HGB 13.1 02/17/2022  ? HCT 39.5 02/17/2022  ? PLT 184.0 02/17/2022  ? GLUCOSE 116 (H) 04/24/2021  ? CHOL 234 (H) 04/24/2021  ? TRIG 378.0 (H) 05/16/2021  ? HDL 62.00 04/24/2021  ? LDLDIRECT 135.0 04/24/2021  ? LDLCALC 161 (H) 02/21/2019  ? ALT 17 07/06/2020  ? AST 18 07/06/2020  ? NA 138 04/24/2021  ? K 4.1 04/24/2021  ? CL 107 04/24/2021  ? CREATININE 1.19 04/24/2021  ? BUN 26 (H) 04/24/2021  ? CO2 24 04/24/2021  ? TSH 0.95 09/17/2021  ? PSA 0.96 09/17/2021  ? INR 1.0 07/08/2019  ? HGBA1C 5.4 05/16/2021  ? ? ?CT Chest Wo Contrast ? ?Result Date: 10/04/2021 ?CLINICAL DATA:  History of pulmonary nodule, melanoma, aortic valve replacement EXAM: CT CHEST WITHOUT CONTRAST TECHNIQUE: Multidetector CT imaging of the chest was performed following the standard protocol without IV contrast. COMPARISON:  07/07/2020 FINDINGS: Cardiovascular: Limited without contrast. Thoracic aortic atherosclerosis and native coronary atherosclerosis scattered calcifications. Thoracic aorta normal in caliber. Negative for aneurysm. No mediastinal hemorrhage or hematoma. Previous TAVR noted. Normal heart size. Mediastinum/Nodes: No enlarged mediastinal or axillary lymph nodes. Thyroid gland, trachea, and esophagus demonstrate no significant findings. Lungs/Pleura: Stable bibasilar subpleural scarring. Stable 3 mm calcified granuloma in the right lung base. No acute airspace process, collapse or consolidation. No new or enlarging pulmonary nodule. Trachea and central airways are calcified but patent. No pleural abnormality, effusion, or pneumothorax. Upper Abdomen: Small hiatal hernia. Abdominal aortic atherosclerosis. No acute upper abdominal finding. Splenic calcified granuloma. Musculoskeletal: Degenerative changes of the spine. No acute osseous finding IMPRESSION: Stable bibasilar  subpleural scarring, mild. Stable 3 mm calcified granuloma right lower lobe. Remote TAVR. Native thoracic aortic and coronary atherosclerosis. No suspicious or enlarging pulmonary nodule. Aortic Atherosclerosis (ICD10-I70.0). Electronically Signed   By: Jerilynn Mages.  Shick M.D.   On: 10/04/2021 09:24  ? ? ?Assessment & Plan:  ? ?Jamel was seen today for rash. ? ?Diagnoses and all orders for this visit: ? ?Rash- The rash is asymptomatic and appears insignificant.  All of the testing is reassuring. ?-     CBC with Differential/Platelet; Future ?-     RPR; Future ?-     C-reactive protein; Future ?-     Rocky mtn spotted fvr abs pnl(IgG+IgM); Future ?-     Rocky mtn spotted fvr abs pnl(IgG+IgM) ?-  C-reactive protein ?-     RPR ?-     CBC with Differential/Platelet ? ?Tick bite of right thigh, sequela ?-     CBC with Differential/Platelet; Future ?-     C-reactive protein; Future ?-     Rocky mtn spotted fvr abs pnl(IgG+IgM); Future ?-     Rocky mtn spotted fvr abs pnl(IgG+IgM) ?-     C-reactive protein ?-     CBC with Differential/Platelet ? ?Stage 3a chronic kidney disease ([place]) ? ?Vitamin B12 deficiency anemia due to intrinsic factor deficiency ?-     Folate; Future ?-     cyanocobalamin ((VITAMIN B-12)) injection 1,000 mcg ?-     Folate ? ?Thrombocytopenia ([place])- His platelet count is normal now. ?-     Rocky mtn spotted fvr abs pnl(IgG+IgM); Future ?-     Rocky mtn spotted fvr abs pnl(IgG+IgM) ? ? ?I am having Wynona Luna. Rehm maintain his fish oil-omega-3 fatty acids, cetirizine, amoxicillin, nystatin cream, Glucosamine-Chondroit-Vit C-Mn (GLUCOSAMINE 1500 COMPLEX PO), sildenafil, esomeprazole, sucralfate, tadalafil, rosuvastatin, and olmesartan. We administered cyanocobalamin. ? ?Meds ordered this encounter  ?Medications  ? cyanocobalamin ((VITAMIN B-12)) injection 1,000 mcg  ? ? ? ?Follow-up: Return in about 3 weeks (around 03/10/2022). ? ?Scarlette Calico, MD ?

## 2022-02-19 ENCOUNTER — Encounter: Payer: Self-pay | Admitting: Internal Medicine

## 2022-02-19 LAB — ROCKY MTN SPOTTED FVR ABS PNL(IGG+IGM)
RMSF IgG: NOT DETECTED
RMSF IgM: NOT DETECTED

## 2022-02-19 LAB — RPR: RPR Ser Ql: NONREACTIVE

## 2022-03-11 DIAGNOSIS — R059 Cough, unspecified: Secondary | ICD-10-CM | POA: Diagnosis not present

## 2022-03-11 DIAGNOSIS — R051 Acute cough: Secondary | ICD-10-CM | POA: Diagnosis not present

## 2022-03-11 DIAGNOSIS — Z20822 Contact with and (suspected) exposure to covid-19: Secondary | ICD-10-CM | POA: Diagnosis not present

## 2022-05-14 DIAGNOSIS — H18603 Keratoconus, unspecified, bilateral: Secondary | ICD-10-CM | POA: Diagnosis not present

## 2022-05-14 DIAGNOSIS — H2513 Age-related nuclear cataract, bilateral: Secondary | ICD-10-CM | POA: Diagnosis not present

## 2022-05-14 DIAGNOSIS — H52203 Unspecified astigmatism, bilateral: Secondary | ICD-10-CM | POA: Diagnosis not present

## 2022-06-06 ENCOUNTER — Other Ambulatory Visit: Payer: Self-pay | Admitting: Internal Medicine

## 2022-06-06 DIAGNOSIS — I10 Essential (primary) hypertension: Secondary | ICD-10-CM

## 2022-06-13 ENCOUNTER — Other Ambulatory Visit: Payer: Self-pay

## 2022-06-13 DIAGNOSIS — I10 Essential (primary) hypertension: Secondary | ICD-10-CM

## 2022-06-13 DIAGNOSIS — E785 Hyperlipidemia, unspecified: Secondary | ICD-10-CM

## 2022-06-13 MED ORDER — ROSUVASTATIN CALCIUM 10 MG PO TABS: 10.0000 mg | ORAL_TABLET | Freq: Every day | ORAL | 0 refills | Status: DC

## 2022-06-13 MED ORDER — OLMESARTAN MEDOXOMIL 20 MG PO TABS: ORAL_TABLET | ORAL | 0 refills | Status: DC

## 2022-06-17 DIAGNOSIS — H353131 Nonexudative age-related macular degeneration, bilateral, early dry stage: Secondary | ICD-10-CM | POA: Diagnosis not present

## 2022-06-17 DIAGNOSIS — H18603 Keratoconus, unspecified, bilateral: Secondary | ICD-10-CM | POA: Diagnosis not present

## 2022-06-17 DIAGNOSIS — H52203 Unspecified astigmatism, bilateral: Secondary | ICD-10-CM | POA: Diagnosis not present

## 2022-06-17 DIAGNOSIS — H2513 Age-related nuclear cataract, bilateral: Secondary | ICD-10-CM | POA: Diagnosis not present

## 2022-07-04 ENCOUNTER — Encounter: Payer: Self-pay | Admitting: Cardiology

## 2022-07-04 ENCOUNTER — Ambulatory Visit (INDEPENDENT_AMBULATORY_CARE_PROVIDER_SITE_OTHER): Payer: Medicare Other | Admitting: Cardiology

## 2022-07-04 VITALS — BP 116/70 | HR 65 | Ht 69.0 in | Wt 160.0 lb

## 2022-07-04 DIAGNOSIS — I10 Essential (primary) hypertension: Secondary | ICD-10-CM | POA: Diagnosis not present

## 2022-07-04 DIAGNOSIS — Z952 Presence of prosthetic heart valve: Secondary | ICD-10-CM | POA: Diagnosis not present

## 2022-07-04 NOTE — Patient Instructions (Signed)

## 2022-07-04 NOTE — Progress Notes (Signed)
Cardiology Office Note:    Date:  07/04/2022   ID:  Charles Robles, DOB 1945-12-27, MRN [id number]  PCP:  Janith Lima, MD   [place]  Cardiologist:  Candee Furbish, MD  Advanced Practice Provider:  No care team member to display Electrophysiologist:  None       Referring MD: Janith Lima, MD     History of Present Illness:    Charles Robles is a 76 y.o. male here for follow up of AS post TAVR 2020, hypertension, and hyperlipidemia.   Previously here for the follow up of post aortic valve replacement, TAVR 07/12/2019.  Dr. Angelena Form.  26 mm Edwards sapient 3 ultra T HV via the transfemoral approach.  Post-op echo showed mean gradient of 8 mmHg.  No perivalvular leak.    At his last appointment, he seemed to be doing quite well. He had reported some fatigue.   Today:  He appears to be doing well. He is scheduled to see his PCP at the end of next month and requests a prescription for a Vitamin B12 shot.   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  He denies any palpitations, chest pain, shortness of breath, or peripheral edema. No lightheadedness, headaches, syncope, orthopnea, or PND.   Past Medical History:  Diagnosis Date   Abnormal large bowel motility    Arthritis    Basal cell carcinoma 2017   left back bcc tx cx3 27f   Hearing loss    Heart murmur    Hyperlipidemia    Hypertension    [place]   Other fatigue    Pulmonary nodule    a. noted on pre TAVR CT, will need 1 year follow up given smoking history    S/P TAVR (transcatheter aortic valve replacement)    a. s/p TAVR with a 26 mm Edwards sapein 3 Ultra valve via the TF approach on 07/12/19   Severe aortic stenosis    a. s/p TAVR with a 26 mm Edwards sapein 3 Ultra valve via the TF approach on 07/12/19   Stool incontinence    Urgency of urination    Vision loss of right eye     Past  Surgical History:  Procedure Laterality Date   COLONOSCOPY  2017   EYE SURGERY  1981    Right eye- corneal transplant   HERNIA REPAIR     x3   POLYPECTOMY     RIGHT/LEFT HEART CATH AND CORONARY ANGIOGRAPHY N/A 06/23/2018   Procedure: RIGHT/LEFT HEART CATH AND CORONARY ANGIOGRAPHY;  Surgeon: MBurnell Blanks MD;  [facility];  Service: Cardiovascular;  Laterality: N/A;   TEE WITHOUT CARDIOVERSION N/A 07/12/2019   Procedure: TRANSESOPHAGEAL ECHOCARDIOGRAM (TEE);  Surgeon: MBurnell Blanks MD;  [facility]  Service: Open Heart Surgery;  Laterality: N/A;   TONSILLECTOMY     TRANSCATHETER AORTIC VALVE REPLACEMENT, TRANSFEMORAL N/A 07/12/2019   Procedure: TRANSCATHETER AORTIC VALVE REPLACEMENT, TRANSFEMORAL;  Surgeon: MBurnell Blanks MD;  [facility]  Service: Open Heart Surgery;  Laterality: N/A;    Current Medications: Current Meds  Medication Sig   amoxicillin (AMOXIL) 500 MG tablet Take 4 tablets (2,000 mg total) by mouth as directed. Take 4 tablets 1 hour prior to dental work, including cleanings.   esomeprazole (NEXIUM) 40 MG capsule Take 1 capsule (40 mg total) by mouth daily.   fish  oil-omega-3 fatty acids 1000 MG capsule Take 1 g by mouth daily.   nystatin cream (MYCOSTATIN) Apply 1 application topically 2 (two) times daily.   olmesartan (BENICAR) 20 MG tablet TAKE 1 TABLET(20 MG) BY MOUTH DAILY. Please schedule overdue appt. with Cardiologist in order to receive future refills. Thank You.   rosuvastatin (CRESTOR) 10 MG tablet Take 1 tablet (10 mg total) by mouth daily. Please schedule overdue appt. with Cardiologist in order to receive future refills. Thank You.   sildenafil (VIAGRA) 100 MG tablet Take 1 tablet (100 mg total) by mouth daily as needed for erectile dysfunction.   sucralfate (CARAFATE) 1 g tablet Take 1 tablet (1 g total) by mouth 4 (four) times daily -  with meals and at bedtime.     Allergies:   Patient has no known  allergies.   Social History   Socioeconomic History   Marital status: Divorced    Spouse name: Not on file   Number of children: 0   Years of education: Not on file   Highest education level: Not on file  Occupational History   Occupation: Retired-Psychologist  Tobacco Use   Smoking status: Some Days    Types: Cigars   Smokeless tobacco: Never  Vaping Use   Vaping Use: Never used  Substance and Sexual Activity   Alcohol use: Yes    Alcohol/week: 32.0 standard drinks of alcohol    Types: 30 Cans of beer, 2 Glasses of wine per week   Drug use: Yes    Types: Marijuana    Comment: 1-2 week   Sexual activity: Never  Other Topics Concern   Not on file  Social History Narrative   Not on file   Social Determinants of Health   Financial Resource Strain: Low Risk  (08/27/2021)   Overall Financial Resource Strain (CARDIA)    Difficulty of Paying Living Expenses: Not hard at all  Food Insecurity: No Food Insecurity (08/27/2021)   Hunger Vital Sign    Worried About Running Out of Food in the Last Year: Never true    Ran Out of Food in the Last Year: Never true  Transportation Needs: No Transportation Needs (08/27/2021)   PRAPARE - Hydrologist (Medical): No    Lack of Transportation (Non-Medical): No  Physical Activity: Sufficiently Active (08/27/2021)   Exercise Vital Sign    Days of Exercise per Week: 5 days    Minutes of Exercise per Session: 30 min  Stress: No Stress Concern Present (08/27/2021)   [place]    Feeling of Stress : Not at all  Social Connections: Moderately Integrated (08/27/2021)   Social Connection and Isolation Panel [NHANES]    Frequency of Communication with Friends and Family: More than three times a week    Frequency of Social Gatherings with Friends and Family: More than three times a week    Attends Religious Services: 1 to 4 times per year    Active  Member of [employer] or Organizations: Yes    Attends Archivist Meetings: 1 to 4 times per year    Marital Status: Never married     Family History: The patient's family history includes Alcohol abuse in his mother; Heart disease in his father; Hyperlipidemia in his brother and father; Hypertension in his brother and mother. There is no history of Colon cancer, Colon polyps, Rectal cancer, Stomach cancer, or Esophageal cancer.  ROS:   Please see  the history of present illness.      All other systems reviewed and are negative.  EKGs/Labs/Other Studies Reviewed:    Echo 07/04/2020: 1. Left ventricular ejection fraction, by estimation, is 60 to 65%. The  left ventricle has normal function. The left ventricle has no regional  wall motion abnormalities. There is mild left ventricular hypertrophy.  Left ventricular diastolic parameters  are consistent with Grade I diastolic dysfunction (impaired relaxation).   2. Right ventricular systolic function is normal. The right ventricular  size is normal. There is normal pulmonary artery systolic pressure.   3. The mitral valve is normal in structure. No evidence of mitral valve  regurgitation. No evidence of mitral stenosis.   4. The aortic valve has been repaired/replaced. Aortic valve  regurgitation is not visualized. Mild aortic valve stenosis. There is a 26  mm Edwards Sapien prosthetic (TAVR) valve present in the aortic position.  Procedure Date: 07/12/2019. Aortic valve  area, by VTI measures 1.26 cm. Aortic valve mean gradient measures 22.0  mmHg. Aortic valve Vmax measures 3.06 m/s.   5. The inferior vena cava is normal in size with greater than 50%  respiratory variability, suggesting right atrial pressure of 3 mmHg.    Right/Left Heart Cath and Coronary Angiography 06/23/2018: Prox RCA to Mid RCA lesion is 20% stenosed. Prox LAD to Mid LAD lesion is 20% stenosed. Prox Cx lesion is 30% stenosed. Mid Cx lesion is 30% stenosed.    1. Mild non-obstructive CAD 2. Severe aortic stenosis by echo. By cath, mean gradient 15.7 mmHg, peak to peak gradient 21 mmHg, AVA 1.45 cm2,    Recommendations: Pt with chest pain, dizziness and fatigue felt to be due to his aortic stenosis. Continue workup and planning for TAVR.     EKG: EKG is personally reviewed.  07/04/22: Sinus rhythm. Rate 65 bpm.  02/22/21;: sinus rhythm 67 with nonspecific ST-T wave changes, ST depression noted in the inferior leads.  Recent Labs: 09/17/2021: NT-Pro BNP 170; TSH 0.95 02/17/2022: Hemoglobin 13.1; Platelets 184.0  Recent Lipid Panel    Component Value Date/Time   CHOL 234 (H) 04/24/2021 1348   CHOL 275 (H) 02/21/2019 1140   TRIG 378.0 (H) 05/16/2021 1537   HDL 62.00 04/24/2021 1348   HDL 89 02/21/2019 1140   CHOLHDL 4 04/24/2021 1348   VLDL 38 (H) 01/16/2016 1207   LDLCALC 161 (H) 02/21/2019 1140   LDLDIRECT 135.0 04/24/2021 1348     Risk Assessment/Calculations:      Physical Exam:    VS:  BP 116/70 (BP Location: Left Arm, Patient Position: Sitting, Cuff Size: Normal)   Pulse 65   Ht '5\' 9"'$  (1.753 m)   Wt 160 lb (72.6 kg)   BMI 23.63 kg/m     Wt Readings from Last 3 Encounters:  07/04/22 160 lb (72.6 kg)  02/17/22 161 lb (73 kg)  02/10/22 163 lb 2 oz (74 kg)     GEN:  Well nourished, well developed in no acute distress HEENT: Normal NECK: No JVD; No carotid bruits LYMPHATICS: No lymphadenopathy CARDIAC: RRR, soft systolic murmur,no rubs, gallops RESPIRATORY:  Clear to auscultation without rales, wheezing or rhonchi  ABDOMEN: Soft, non-tender, non-distended MUSCULOSKELETAL:  No edema; No deformity  SKIN: Warm and dry NEUROLOGIC:  Alert and oriented x 3 PSYCHIATRIC:  Normal affect   ASSESSMENT:    1. Essential hypertension   2. S/P TAVR (transcatheter aortic valve replacement)     PLAN:    In order of problems listed  above:  TAVR, aortic valve replacement prior severe aortic stenosis - Overall doing very well.   No symptoms.  Essential hypertension Reasonably controlled One lisinopril.   Hyperlipidemia -Prior LDL 161.  Previously not interested in statin.  Currently on Crestor 10 mg.  Pulmonary nodule -2 mm right lower lobe nodule.  CT surveillance.  Subsequent CT showed calcified granuloma.  Stable.  Erectile dysfunction - Previously gave him a prescription for Viagra.    Follow up: 1 year  Medication Adjustments/Labs and Tests Ordered: Current medicines are reviewed at length with the patient today.  Concerns regarding medicines are outlined above.  Orders Placed This Encounter  Procedures   EKG 12-Lead   No orders of the defined types were placed in this encounter.   Patient Instructions  Medication Instructions:  The current medical regimen is effective;  continue present plan and medications.  *If you need a refill on your cardiac medications before your next appointment, please call your pharmacy*  Follow-Up: At [facility], you and your health needs are our priority.  As part of our continuing mission to provide you with exceptional heart care, we have created designated Provider Care Teams.  These Care Teams include your primary Cardiologist (physician) and Advanced Practice Providers (APPs -  Physician Assistants and Nurse Practitioners) who all work together to provide you with the care you need, when you need it.  We recommend signing up for the patient portal called "MyChart".  Sign up information is provided on this After Visit Summary.  MyChart is used to connect with patients for Virtual Visits (Telemedicine).  Patients are able to view lab/test results, encounter notes, upcoming appointments, etc.  Non-urgent messages can be sent to your provider as well.   To learn more about what you can do with MyChart, go to [email or web address].    Your next appointment:   1 year(s)  The format for your next appointment:   In Person  Provider:   Candee Furbish, MD  {   Important Information About Sugar          I,Breanna Adamick,acting as a scribe for Candee Furbish, MD.,have documented all relevant documentation on the behalf of Candee Furbish, MD,as directed by  Candee Furbish, MD while in the presence of Candee Furbish, MD.   I, Candee Furbish, MD, have reviewed all documentation for this visit. The documentation on 07/04/22 for the exam, diagnosis, procedures, and orders are all accurate and complete.   Signed, Candee Furbish, MD  07/04/2022 3:01 PM    [facility]

## 2022-07-18 ENCOUNTER — Telehealth: Payer: Self-pay | Admitting: Internal Medicine

## 2022-07-18 NOTE — Telephone Encounter (Signed)
Patient needs his omasartan and resouvastatin filled - Please send to [facility] on [address].

## 2022-07-22 ENCOUNTER — Other Ambulatory Visit: Payer: Self-pay | Admitting: Internal Medicine

## 2022-07-22 DIAGNOSIS — E785 Hyperlipidemia, unspecified: Secondary | ICD-10-CM

## 2022-07-22 DIAGNOSIS — I10 Essential (primary) hypertension: Secondary | ICD-10-CM

## 2022-07-22 MED ORDER — OLMESARTAN MEDOXOMIL 20 MG PO TABS: ORAL_TABLET | ORAL | 0 refills | Status: DC

## 2022-07-22 MED ORDER — ROSUVASTATIN CALCIUM 10 MG PO TABS: 10.0000 mg | ORAL_TABLET | Freq: Every day | ORAL | 0 refills | Status: DC

## 2022-08-11 ENCOUNTER — Ambulatory Visit: Payer: Medicare Other | Admitting: Cardiology

## 2022-08-12 ENCOUNTER — Ambulatory Visit (INDEPENDENT_AMBULATORY_CARE_PROVIDER_SITE_OTHER): Payer: Medicare Other | Admitting: Internal Medicine

## 2022-08-12 ENCOUNTER — Encounter: Payer: Self-pay | Admitting: Internal Medicine

## 2022-08-12 VITALS — BP 126/82 | HR 65 | Temp 98.2°F | Ht 69.0 in | Wt 162.0 lb

## 2022-08-12 DIAGNOSIS — I10 Essential (primary) hypertension: Secondary | ICD-10-CM | POA: Diagnosis not present

## 2022-08-12 DIAGNOSIS — D510 Vitamin B12 deficiency anemia due to intrinsic factor deficiency: Secondary | ICD-10-CM | POA: Diagnosis not present

## 2022-08-12 DIAGNOSIS — R0981 Nasal congestion: Secondary | ICD-10-CM | POA: Diagnosis not present

## 2022-08-12 DIAGNOSIS — N1831 Chronic kidney disease, stage 3a: Secondary | ICD-10-CM

## 2022-08-12 DIAGNOSIS — Z23 Encounter for immunization: Secondary | ICD-10-CM | POA: Diagnosis not present

## 2022-08-12 DIAGNOSIS — E785 Hyperlipidemia, unspecified: Secondary | ICD-10-CM | POA: Diagnosis not present

## 2022-08-12 DIAGNOSIS — M48062 Spinal stenosis, lumbar region with neurogenic claudication: Secondary | ICD-10-CM | POA: Diagnosis not present

## 2022-08-12 LAB — FOLATE: Folate: 20 ng/mL (ref >5.9)

## 2022-08-12 LAB — HEPATIC FUNCTION PANEL
ALT: 21 U/L (ref 0–53)
AST: 21 U/L (ref 0–37)
Albumin: 4.3 g/dL (ref 3.5–5.2)
Alkaline Phosphatase: 55 U/L (ref 39–117)
Bilirubin, Direct: 0.1 mg/dL (ref 0.0–0.3)
Total Bilirubin: 0.5 mg/dL (ref 0.2–1.2)
Total Protein: 7.4 g/dL (ref 6.0–8.3)

## 2022-08-12 LAB — CBC WITH DIFFERENTIAL/PLATELET
Basophils Absolute: 0.1 10*3/uL (ref 0.0–0.1)
Basophils Relative: 1 % (ref 0.0–3.0)
Eosinophils Absolute: 0.1 10*3/uL (ref 0.0–0.7)
Eosinophils Relative: 1.8 % (ref 0.0–5.0)
HCT: 40.5 % (ref 39.0–52.0)
Hemoglobin: 13.7 g/dL (ref 13.0–17.0)
Lymphocytes Relative: 22.5 % (ref 12.0–46.0)
Lymphs Abs: 1.5 10*3/uL (ref 0.7–4.0)
MCHC: 33.8 g/dL (ref 30.0–36.0)
MCV: 97.3 fl (ref 78.0–100.0)
Monocytes Absolute: 0.7 10*3/uL (ref 0.1–1.0)
Monocytes Relative: 9.9 % (ref 3.0–12.0)
Neutro Abs: 4.3 10*3/uL (ref 1.4–7.7)
Neutrophils Relative %: 64.8 % (ref 43.0–77.0)
Platelets: 163 10*3/uL (ref 150.0–400.0)
RBC: 4.16 Mil/uL — ABNORMAL LOW (ref 4.22–5.81)
RDW: 13.8 % (ref 11.5–15.5)
WBC: 6.6 10*3/uL (ref 4.0–10.5)

## 2022-08-12 LAB — BASIC METABOLIC PANEL
BUN: 18 mg/dL (ref 6–23)
CO2: 22 mEq/L (ref 19–32)
Calcium: 9.3 mg/dL (ref 8.4–10.5)
Chloride: 102 mEq/L (ref 96–112)
Creatinine, Ser: 1.15 mg/dL (ref 0.40–1.50)
GFR: 61.52 mL/min (ref >60.00)
Glucose, Bld: 135 mg/dL — ABNORMAL HIGH (ref 70–99)
Potassium: 4.4 mEq/L (ref 3.5–5.1)
Sodium: 132 mEq/L — ABNORMAL LOW (ref 135–145)

## 2022-08-12 LAB — LIPID PANEL
Cholesterol: 211 mg/dL — ABNORMAL HIGH (ref 0–200)
HDL: 87.2 mg/dL (ref >39.00)
NonHDL: 124.28
Total CHOL/HDL Ratio: 2
Triglycerides: 313 mg/dL — ABNORMAL HIGH (ref 0.0–149.0)
VLDL: 62.6 mg/dL — ABNORMAL HIGH (ref 0.0–40.0)

## 2022-08-12 LAB — LDL CHOLESTEROL, DIRECT: Direct LDL: 96 mg/dL

## 2022-08-12 MED ORDER — LEVOCETIRIZINE DIHYDROCHLORIDE 5 MG PO TABS: 5.0000 mg | ORAL_TABLET | Freq: Every evening | ORAL | 1 refills | Status: DC

## 2022-08-12 MED ORDER — ROSUVASTATIN CALCIUM 10 MG PO TABS: 10.0000 mg | ORAL_TABLET | Freq: Every day | ORAL | 0 refills | Status: DC

## 2022-08-12 NOTE — Patient Instructions (Signed)

## 2022-08-12 NOTE — Progress Notes (Signed)
Subjective:  Patient ID: Charles Robles, male    DOB: 1945/11/29  Age: 76 y.o. MRN: [id number]  CC: Hypertension   HPI Tracker Mance presents for f/up -  He asked me to check some moles on his back.  He also complains of a 20-monthhistory of worsening low back pain that radiates into both lower extremities and causes lower extremity weakness.  He is very active, has recently been splitting wood, and denies chest pain, shortness of breath, diaphoresis, or edema.  Outpatient Medications Prior to Visit  Medication Sig Dispense Refill   amoxicillin (AMOXIL) 500 MG tablet Take 4 tablets (2,000 mg total) by mouth as directed. Take 4 tablets 1 hour prior to dental work, including cleanings. 12 tablet 12   esomeprazole (NEXIUM) 40 MG capsule Take 1 capsule (40 mg total) by mouth daily. 90 capsule 1   fish oil-omega-3 fatty acids 1000 MG capsule Take 1 g by mouth daily.     nystatin cream (MYCOSTATIN) Apply 1 application topically 2 (two) times daily. 30 g 1   olmesartan (BENICAR) 20 MG tablet TAKE 1 TABLET(20 MG) BY MOUTH DAILY. 30 tablet 0   sildenafil (VIAGRA) 100 MG tablet Take 1 tablet (100 mg total) by mouth daily as needed for erectile dysfunction. 10 tablet 3   sucralfate (CARAFATE) 1 g tablet Take 1 tablet (1 g total) by mouth 4 (four) times daily -  with meals and at bedtime. 360 tablet 0   rosuvastatin (CRESTOR) 10 MG tablet Take 1 tablet (10 mg total) by mouth daily. 30 tablet 0   No facility-administered medications prior to visit.    ROS Review of Systems  Constitutional:  Negative for appetite change, chills, diaphoresis, fatigue and fever.  HENT:  Positive for congestion.   Eyes: Negative.   Respiratory:  Negative for cough, chest tightness, shortness of breath and wheezing.   Cardiovascular:  Negative for chest pain, palpitations and leg swelling.  Gastrointestinal:  Negative for abdominal pain, constipation, diarrhea, nausea and vomiting.   Genitourinary: Negative.  Negative for difficulty urinating and dysuria.  Musculoskeletal:  Positive for arthralgias and back pain. Negative for myalgias and neck pain.  Skin: Negative.  Negative for color change.  Neurological:  Positive for weakness. Negative for dizziness and light-headedness.  Hematological:  Negative for adenopathy. Does not bruise/bleed easily.  Psychiatric/Behavioral: Negative.      Objective:  BP 126/82 (BP Location: Right Arm, Patient Position: Sitting, Cuff Size: Large)   Pulse 65   Temp 98.2 F (36.8 C) (Oral)   Ht '5\' 9"'$  (1.753 m)   Wt 162 lb (73.5 kg)   SpO2 95%   BMI 23.92 kg/m   BP Readings from Last 3 Encounters:  08/12/22 126/82  07/04/22 116/70  02/17/22 128/76    Wt Readings from Last 3 Encounters:  08/12/22 162 lb (73.5 kg)  07/04/22 160 lb (72.6 kg)  02/17/22 161 lb (73 kg)    Physical Exam Vitals reviewed.  Constitutional:      Appearance: Normal appearance.  HENT:     Nose: Nose normal.     Mouth/Throat:     Mouth: Mucous membranes are moist.  Eyes:     General: No scleral icterus.    Conjunctiva/sclera: Conjunctivae normal.  Cardiovascular:     Rate and Rhythm: Normal rate and regular rhythm.     Heart sounds: Murmur heard.     Systolic murmur is present with a grade of 1/6.     No gallop.  Pulmonary:     Effort: Pulmonary effort is normal.     Breath sounds: No stridor. No wheezing, rhonchi or rales.  Musculoskeletal:        General: No swelling.     Cervical back: Neck supple.     Right lower leg: No edema.     Left lower leg: No edema.  Lymphadenopathy:     Cervical: No cervical adenopathy.  Skin:    General: Skin is warm and dry.     Findings: No rash.  Neurological:     General: No focal deficit present.     Mental Status: He is alert.     Cranial Nerves: Cranial nerves 2-12 are intact.     Sensory: Sensation is intact.     Motor: Weakness present.     Coordination: Coordination is intact. Romberg sign  negative. Finger-Nose-Finger Test normal.     Deep Tendon Reflexes: Reflexes normal.     Reflex Scores:      Tricep reflexes are 2+ on the right side and 2+ on the left side.      Bicep reflexes are 2+ on the right side and 2+ on the left side.      Brachioradialis reflexes are 1+ on the right side and 1+ on the left side.      Patellar reflexes are 3+ on the right side and 3+ on the left side.      Achilles reflexes are 1+ on the right side and 1+ on the left side.    Lab Results  Component Value Date   WBC 6.6 08/12/2022   HGB 13.7 08/12/2022   HCT 40.5 08/12/2022   PLT 163.0 08/12/2022   GLUCOSE 135 (H) 08/12/2022   CHOL 211 (H) 08/12/2022   TRIG 313.0 (H) 08/12/2022   HDL 87.20 08/12/2022   LDLDIRECT 96.0 08/12/2022   LDLCALC 161 (H) 02/21/2019   ALT 21 08/12/2022   AST 21 08/12/2022   NA 132 (L) 08/12/2022   K 4.4 08/12/2022   CL 102 08/12/2022   CREATININE 1.15 08/12/2022   BUN 18 08/12/2022   CO2 22 08/12/2022   TSH 0.95 09/17/2021   PSA 0.96 09/17/2021   INR 1.0 07/08/2019   HGBA1C 5.4 05/16/2021    CT Chest Wo Contrast  Result Date: 10/04/2021 CLINICAL DATA:  History of pulmonary nodule, melanoma, aortic valve replacement EXAM: CT CHEST WITHOUT CONTRAST TECHNIQUE: Multidetector CT imaging of the chest was performed following the standard protocol without IV contrast. COMPARISON:  07/07/2020 FINDINGS: Cardiovascular: Limited without contrast. Thoracic aortic atherosclerosis and native coronary atherosclerosis scattered calcifications. Thoracic aorta normal in caliber. Negative for aneurysm. No mediastinal hemorrhage or hematoma. Previous TAVR noted. Normal heart size. Mediastinum/Nodes: No enlarged mediastinal or axillary lymph nodes. Thyroid gland, trachea, and esophagus demonstrate no significant findings. Lungs/Pleura: Stable bibasilar subpleural scarring. Stable 3 mm calcified granuloma in the right lung base. No acute airspace process, collapse or consolidation.  No new or enlarging pulmonary nodule. Trachea and central airways are calcified but patent. No pleural abnormality, effusion, or pneumothorax. Upper Abdomen: Small hiatal hernia. Abdominal aortic atherosclerosis. No acute upper abdominal finding. Splenic calcified granuloma. Musculoskeletal: Degenerative changes of the spine. No acute osseous finding IMPRESSION: Stable bibasilar subpleural scarring, mild. Stable 3 mm calcified granuloma right lower lobe. Remote TAVR. Native thoracic aortic and coronary atherosclerosis. No suspicious or enlarging pulmonary nodule. Aortic Atherosclerosis (ICD10-I70.0). Electronically Signed   By: Jerilynn Mages.  Shick M.D.   On: 10/04/2021 09:24    Assessment & Plan:  Yuji was seen today for hypertension.  Diagnoses and all orders for this visit:  Primary hypertension- His blood pressure is adequately well controlled. -     Basic metabolic panel; Future -     CBC with Differential/Platelet; Future -     Hepatic function panel; Future -     Hepatic function panel -     CBC with Differential/Platelet -     Basic metabolic panel  Stage 3a chronic kidney disease ([place])- His renal function is stable. -     Basic metabolic panel; Future -     CBC with Differential/Platelet; Future -     CBC with Differential/Platelet -     Basic metabolic panel  Dyslipidemia, goal LDL below 160- LDL goal achieved. Doing well on the statin  -     Lipid panel; Future -     Hepatic function panel; Future -     Hepatic function panel -     Lipid panel -     rosuvastatin (CRESTOR) 10 MG tablet; Take 1 tablet (10 mg total) by mouth daily.  Vitamin B12 deficiency anemia due to intrinsic factor deficiency -     Folate; Future -     Folate  Flu vaccine need -     Flu Vaccine QUAD High Dose(Fluad)  Nasal congestion -     levocetirizine (XYZAL) 5 MG tablet; Take 1 tablet (5 mg total) by mouth every evening.  Spinal stenosis of lumbar region with neurogenic claudication -     MR  Lumbar Spine Wo Contrast; Future  Other orders -     LDL cholesterol, direct -     Tdap (BOOSTRIX) 5-2.5-18.5 LF-MCG/0.5 injection; Inject 0.5 mLs into the muscle once for 1 dose. -     Zoster Vaccine Adjuvanted [facility]) injection; Inject 0.5 mLs into the muscle once for 1 dose.   I am having Wynona Luna. Bilger start on levocetirizine, Boostrix, and Shingrix. I am also having him maintain his fish oil-omega-3 fatty acids, amoxicillin, nystatin cream, sildenafil, esomeprazole, sucralfate, olmesartan, and rosuvastatin.  Meds ordered this encounter  Medications   levocetirizine (XYZAL) 5 MG tablet    Sig: Take 1 tablet (5 mg total) by mouth every evening.    Dispense:  90 tablet    Refill:  1   rosuvastatin (CRESTOR) 10 MG tablet    Sig: Take 1 tablet (10 mg total) by mouth daily.    Dispense:  90 tablet    Refill:  0   Tdap (BOOSTRIX) 5-2.5-18.5 LF-MCG/0.5 injection    Sig: Inject 0.5 mLs into the muscle once for 1 dose.    Dispense:  0.5 mL    Refill:  0   Zoster Vaccine Adjuvanted [facility]) injection    Sig: Inject 0.5 mLs into the muscle once for 1 dose.    Dispense:  0.5 mL    Refill:  1     Follow-up: Return in about 3 months (around 11/11/2022).  Scarlette Calico, MD

## 2022-08-13 ENCOUNTER — Encounter: Payer: Self-pay | Admitting: Internal Medicine

## 2022-08-16 MED ORDER — SHINGRIX 50 MCG/0.5ML IM SUSR: 0.5000 mL | Freq: Once | INTRAMUSCULAR | 1 refills | Status: AC

## 2022-08-16 MED ORDER — BOOSTRIX 5-2.5-18.5 LF-MCG/0.5 IM SUSP: 0.5000 mL | Freq: Once | INTRAMUSCULAR | 0 refills | Status: AC

## 2022-08-22 ENCOUNTER — Telehealth: Payer: Self-pay

## 2022-08-22 DIAGNOSIS — I10 Essential (primary) hypertension: Secondary | ICD-10-CM

## 2022-08-22 MED ORDER — OLMESARTAN MEDOXOMIL 20 MG PO TABS: ORAL_TABLET | ORAL | 2 refills | Status: DC

## 2022-08-22 NOTE — Telephone Encounter (Signed)
MEDICATION: olmesartan (BENICAR) 20 MG tablet  PHARMACY: [phone] - [place], [place] - [address] AT [address]  Comments: Patient is aware Dr.Jones is out of office but patient is completley out.   **Let patient know to contact pharmacy at the end of the day to make sure medication is ready. **  ** Please notify patient to allow 48-72 hours to process**  **Encourage patient to contact the pharmacy for refills or they can request refills through [facility]**

## 2022-08-25 ENCOUNTER — Telehealth: Payer: Self-pay | Admitting: Internal Medicine

## 2022-08-25 NOTE — Telephone Encounter (Signed)
LVM for pt to rtn my call to schedule AWV with [facility] call back # [phone] 

## 2022-08-27 ENCOUNTER — Telehealth: Payer: Self-pay | Admitting: Internal Medicine

## 2022-08-27 NOTE — Telephone Encounter (Signed)
LVM for pt to rtn my call to schedule AWV with [facility] call back # [phone] 

## 2022-09-04 ENCOUNTER — Telehealth: Payer: Self-pay

## 2022-09-04 ENCOUNTER — Ambulatory Visit (INDEPENDENT_AMBULATORY_CARE_PROVIDER_SITE_OTHER): Payer: Medicare Other

## 2022-09-04 VITALS — BP 124/70 | HR 66 | Temp 97.6°F | Resp 16 | Ht 69.0 in | Wt 165.2 lb

## 2022-09-04 DIAGNOSIS — Z0000 Encounter for general adult medical examination without abnormal findings: Secondary | ICD-10-CM

## 2022-09-04 NOTE — Telephone Encounter (Signed)
Patient is inquiring why Stage 3 CKD is listed on his chart. CB# [phone]

## 2022-09-04 NOTE — Progress Notes (Signed)
Subjective:   Charles Robles is a 76 y.o. male who presents for Medicare Annual/Subsequent preventive examination.  Review of Systems     Cardiac Risk Factors include: advanced age (>65mn, >>90women);dyslipidemia;family history of premature cardiovascular disease;hypertension;male gender     Objective:    Today's Vitals   09/04/22 0921  BP: 124/70  Pulse: 66  Resp: 16  Temp: 97.6 F (36.4 C)  TempSrc: Temporal  SpO2: 98%  Weight: 165 lb 3.2 oz (74.9 kg)  Height: '5\' 9"'$  (1.753 m)  PainSc: 0-No pain   Body mass index is 24.4 kg/m.     09/04/2022    9:38 AM 09/27/2021    3:15 PM 08/27/2021    3:08 PM 05/10/2020    2:33 PM 07/12/2019    6:00 PM 07/08/2019    1:34 PM 06/22/2019   12:37 PM  Advanced Directives  Does Patient Have a Medical Advance Directive? Yes No Yes No Yes Yes Yes  Type of [place]aramed[place]of [place] will  Living will;Healthcare Power of [place] will [place] will  Does patient want to make changes to medical advance directive?   No - Patient declined No - Patient declined No - Patient declined    Copy of [place]in Chart? No - copy requested  No - copy requested  No - copy requested No - copy requested No - copy requested  Would patient like information on creating a medical advance directive?  No - Patient declined  No - Patient declined       Current Medications (verified) Outpatient Encounter Medications as of 09/04/2022  Medication Sig   amoxicillin (AMOXIL) 500 MG tablet Take 4 tablets (2,000 mg total) by mouth as directed. Take 4 tablets 1 hour prior to dental work, including cleanings.   esomeprazole (NEXIUM) 40 MG capsule Take 1 capsule (40 mg total) by mouth daily.   levocetirizine (XYZAL) 5 MG tablet Take 1 tablet (5 mg total) by mouth every evening.   olmesartan (BENICAR) 20 MG tablet TAKE 1  TABLET(20 MG) BY MOUTH DAILY.   rosuvastatin (CRESTOR) 10 MG tablet Take 1 tablet (10 mg total) by mouth daily.   sucralfate (CARAFATE) 1 g tablet Take 1 tablet (1 g total) by mouth 4 (four) times daily -  with meals and at bedtime.   fish oil-omega-3 fatty acids 1000 MG capsule Take 1 g by mouth daily. (Patient not taking: Reported on 09/04/2022)   nystatin cream (MYCOSTATIN) Apply 1 application topically 2 (two) times daily. (Patient not taking: Reported on 09/04/2022)   sildenafil (VIAGRA) 100 MG tablet Take 1 tablet (100 mg total) by mouth daily as needed for erectile dysfunction. (Patient not taking: Reported on 09/04/2022)   No facility-administered encounter medications on file as of 09/04/2022.    Allergies (verified) Patient has no known allergies.   History: Past Medical History:  Diagnosis Date   Abnormal large bowel motility    Arthritis    Basal cell carcinoma 2017   left back bcc tx cx3 [id number]  Hearing loss    Heart murmur    Hyperlipidemia    Hypertension    [place]   Other fatigue    Pulmonary nodule    a. noted on pre TAVR CT, will need 1 year follow up given smoking history    S/P TAVR (transcatheter aortic valve replacement)  a. s/p TAVR with a 26 mm Edwards sapein 3 Ultra valve via the TF approach on 07/12/19   Severe aortic stenosis    a. s/p TAVR with a 26 mm Edwards sapein 3 Ultra valve via the TF approach on 07/12/19   Stool incontinence    Urgency of urination    Vision loss of right eye    Past Surgical History:  Procedure Laterality Date   COLONOSCOPY  2017   EYE SURGERY  1981    Right eye- corneal transplant   HERNIA REPAIR     x3   POLYPECTOMY     RIGHT/LEFT HEART CATH AND CORONARY ANGIOGRAPHY N/A 06/23/2018   Procedure: RIGHT/LEFT HEART CATH AND CORONARY ANGIOGRAPHY;  Surgeon: Burnell Blanks, MD;  Location: [place] [facility];  Service: Cardiovascular;  Laterality: N/A;   TEE WITHOUT  CARDIOVERSION N/A 07/12/2019   Procedure: TRANSESOPHAGEAL ECHOCARDIOGRAM (TEE);  Surgeon: Burnell Blanks, MD;  Location: [place];  Service: Open Heart Surgery;  Laterality: N/A;   TONSILLECTOMY     TRANSCATHETER AORTIC VALVE REPLACEMENT, TRANSFEMORAL N/A 07/12/2019   Procedure: TRANSCATHETER AORTIC VALVE REPLACEMENT, TRANSFEMORAL;  Surgeon: Burnell Blanks, MD;  Location: [place];  Service: Open Heart Surgery;  Laterality: N/A;   Family History  Problem Relation Age of Onset   Heart disease Father        MI   Hyperlipidemia Father    Hypertension Mother    Alcohol abuse Mother    Hyperlipidemia Brother    Hypertension Brother    Colon cancer Neg Hx    Colon polyps Neg Hx    Rectal cancer Neg Hx    Stomach cancer Neg Hx    Esophageal cancer Neg Hx    Social History   Socioeconomic History   Marital status: Divorced    Spouse name: Not on file   Number of children: 0   Years of education: Not on file   Highest education level: Not on file  Occupational History   Occupation: Retired-Psychologist  Tobacco Use   Smoking status: Some Days    Types: Cigars   Smokeless tobacco: Never  Vaping Use   Vaping Use: Never used  Substance and Sexual Activity   Alcohol use: Yes    Alcohol/week: 32.0 standard drinks of alcohol    Types: 30 Cans of beer, 2 Glasses of wine per week   Drug use: Yes    Types: Marijuana    Comment: 1-2 week   Sexual activity: Never  Other Topics Concern   Not on file  Social History Narrative   Not on file   Social Determinants of Health   Financial Resource Strain: Low Risk  (09/04/2022)   Overall Financial Resource Strain (CARDIA)    Difficulty of Paying Living Expenses: Not hard at all  Food Insecurity: No Food Insecurity (09/04/2022)   Hunger Vital Sign    Worried About Running Out of Food in the Last Year: Never true    Ran Out of Food in the Last Year: Never true  Transportation Needs: No Transportation Needs (09/04/2022)    PRAPARE - Hydrologist (Medical): No    Lack of Transportation (Non-Medical): No  Physical Activity: Sufficiently Active (09/04/2022)   Exercise Vital Sign    Days of Exercise per Week: 5 days    Minutes of Exercise per Session: 30 min  Stress: No Stress Concern Present (09/04/2022)   [place]  Feeling of Stress : Not at all  Social Connections: Moderately Integrated (09/04/2022)   Social Connection and Isolation Panel [NHANES]    Frequency of Communication with Friends and Family: More than three times a week    Frequency of Social Gatherings with Friends and Family: More than three times a week    Attends Religious Services: 1 to 4 times per year    Active Member of [employer] or Organizations: Yes    Attends Archivist Meetings: 1 to 4 times per year    Marital Status: Divorced    Tobacco Counseling Ready to quit: Not Answered Counseling given: Not Answered   Clinical Intake:  Pre-visit preparation completed: Yes  Pain : No/denies pain Pain Score: 0-No pain     BMI - recorded: 24.4 Nutritional Status: BMI of 19-24  Normal Nutritional Risks: None Diabetes: No  How often do you need to have someone help you when you read instructions, pamphlets, or other written materials from your doctor or pharmacy?: 1 - Never What is the last grade level you completed in school?: HSG; Ph.D  Diabetic? no  Interpreter Needed?: No  Information entered by :: Lisette Abu, LPN.   Activities of Daily Living    09/04/2022    9:57 AM  In your present state of health, do you have any difficulty performing the following activities:  Hearing? 0  Vision? 0  Difficulty concentrating or making decisions? 0  Walking or climbing stairs? 0  Dressing or bathing? 0  Doing errands, shopping? 0  Preparing Food and eating ? N  Using the Toilet? N  In the past six months, have you  accidently leaked urine? N  Do you have problems with loss of bowel control? N  Managing your Medications? N  Managing your Finances? N  Housekeeping or managing your Housekeeping? N    Patient Care Team: Janith Lima, MD as PCP - General (Internal Medicine) Jerline Pain, MD as PCP - Cardiology (Cardiology) Luberta Mutter, MD as Consulting Physician (Ophthalmology)  Indicate any recent Medical Services you may have received from other than [facility] providers in the past year (date may be approximate).     Assessment:   This is a routine wellness examination for [place].  Hearing/Vision screen Hearing Screening - Comments:: Denies hearing difficulties; no hearing aids.   Vision Screening - Comments:: Wears rx glasses - up to date with routine eye exams with Luberta Mutter, MD.   Dietary issues and exercise activities discussed: Current Exercise Habits: Home exercise routine, Type of exercise: walking (Walking the dogs and chopping wood), Time (Minutes): 30, Frequency (Times/Week): 5, Weekly Exercise (Minutes/Week): 150, Exercise limited by: neurologic condition(s)   Goals Addressed             This Visit's Progress    Maintain my current level of health.        Depression Screen    09/04/2022    9:22 AM 02/10/2022    2:09 PM 08/27/2021    3:10 PM 09/12/2020    1:34 PM 05/10/2020    2:36 PM 03/27/2020    9:07 AM 02/17/2019    3:01 PM  PHQ 2/9 Scores  PHQ - 2 Score 0 0 0 0 0 0 0    Fall Risk    09/04/2022    9:23 AM 02/10/2022    2:09 PM 08/27/2021    3:10 PM 09/12/2020    1:34 PM 05/10/2020    2:36 PM  Fall Risk  Falls in the past year? 0 1 0 0 0  Number falls in past yr: 0 0 0  0  Comment     walking down back stairs slid down stairs 5 steps.  Injury with Fall? 0 1 0  1  Risk for fall due to : No Fall Risks  No Fall Risks    Follow up Falls prevention discussed  Falls evaluation completed Falls evaluation completed Falls evaluation  completed;Education provided    FALL RISK PREVENTION PERTAINING TO THE HOME:  Any stairs in or around the home? Yes  If so, are there any without handrails? No  Home free of loose throw rugs in walkways, pet beds, electrical cords, etc? Yes  Adequate lighting in your home to reduce risk of falls? Yes   ASSISTIVE DEVICES UTILIZED TO PREVENT FALLS:  Life alert? No  Use of a cane, walker or w/c? Yes  Grab bars in the bathroom? No  Shower chair or bench in shower? No  Elevated toilet seat or a handicapped toilet? Yes   TIMED UP AND GO:  Was the test performed? Yes .  Length of time to ambulate 10 feet: 8 sec.   Gait steady and fast with assistive device  Cognitive Function:        09/04/2022    9:23 AM 05/10/2020    2:33 PM 02/17/2019    3:02 PM  6CIT Screen  What Year? 0 points 0 points 0 points  What month? 0 points 0 points 0 points  What time? 0 points 0 points 0 points  Count back from 20 0 points 0 points 0 points  Months in reverse 0 points 0 points 0 points  Repeat phrase 0 points 2 points 0 points  Total Score 0 points 2 points 0 points    Immunizations Immunization History  Administered Date(s) Administered   Fluad Quad(high Dose 65+) 09/12/2020, 09/17/2021, 08/12/2022   PFIZER(Purple Top)SARS-COV-2 Vaccination 01/15/2020, 02/08/2020, 05/29/2021   Pneumococcal Polysaccharide-23 04/24/2021    TDAP status: Due, Education has been provided regarding the importance of this vaccine. Advised may receive this vaccine at local pharmacy or Health Dept. Aware to provide a copy of the vaccination record if obtained from local pharmacy or Health Dept. Verbalized acceptance and understanding.  Flu Vaccine status: Up to date  Pneumococcal vaccine status: Due, Education has been provided regarding the importance of this vaccine. Advised may receive this vaccine at local pharmacy or Health Dept. Aware to provide a copy of the vaccination record if obtained from local  pharmacy or Health Dept. Verbalized acceptance and understanding.  Covid-19 vaccine status: Completed vaccines  Qualifies for Shingles Vaccine? Yes   Zostavax completed No   Shingrix Completed?: No.    Education has been provided regarding the importance of this vaccine. Patient has been advised to call insurance company to determine out of pocket expense if they have not yet received this vaccine. Advised may also receive vaccine at local pharmacy or Health Dept. Verbalized acceptance and understanding.  Screening Tests Health Maintenance  Topic Date Due   TETANUS/TDAP  Never done   Zoster Vaccines- Shingrix (1 of 2) Never done   Pneumonia Vaccine 31+ Years old (2 - PCV) 04/24/2022   COLONOSCOPY (Pts 45-66yr Insurance coverage will need to be confirmed)  01/19/2023   INFLUENZA VACCINE  Completed   Hepatitis C Screening  Completed   HPV VACCINES  Aged Out   COVID-19 Vaccine  Discontinued    Health Maintenance  Health Maintenance Due  Topic Date Due   TETANUS/TDAP  Never done   Zoster Vaccines- Shingrix (1 of 2) Never done   Pneumonia Vaccine 45+ Years old (2 - PCV) 04/24/2022    Colorectal cancer screening: Type of screening: Colonoscopy. Completed 01/19/2020. Repeat every 3 years  Lung Cancer Screening: (Low Dose CT Chest recommended if Age 45-80 years, 30 pack-year currently smoking OR have quit w/in 15years.) does not qualify.   Lung Cancer Screening Referral: no  Additional Screening:  Hepatitis C Screening: does qualify; Completed 01/16/2020  Vision Screening: Recommended annual ophthalmology exams for early detection of glaucoma and other disorders of the eye. Is the patient up to date with their annual eye exam?  Yes  Who is the provider or what is the name of the office in which the patient attends annual eye exams? Luberta Mutter, MD. If pt is not established with a provider, would they like to be referred to a provider to establish care? No .   Dental Screening:  Recommended annual dental exams for proper oral hygiene  Community Resource Referral / Chronic Care Management: CRR required this visit?  No   CCM required this visit?  No      Plan:     I have personally reviewed and noted the following in the patient's chart:   Medical and social history Use of alcohol, tobacco or illicit drugs  Current medications and supplements including opioid prescriptions. Patient is not currently taking opioid prescriptions. Functional ability and status Nutritional status Physical activity Advanced directives List of other physicians Hospitalizations, surgeries, and ER visits in previous 12 months Vitals Screenings to include cognitive, depression, and falls Referrals and appointments  In addition, I have reviewed and discussed with patient certain preventive protocols, quality metrics, and best practice recommendations. A written personalized care plan for preventive services as well as general preventive health recommendations were provided to patient.     Sheral Flow, LPN   07/37/1062   Nurse Notes: N/A

## 2022-09-04 NOTE — Patient Instructions (Addendum)
Charles Robles , Thank you for taking time to come for your Medicare Wellness Visit. I appreciate your ongoing commitment to your health goals. Please review the following plan we discussed and let me know if I can assist you in the future.   These are the goals we discussed:  Goals      Maintain my current level of health.        This is a list of the screening recommended for you and due dates:  Health Maintenance  Topic Date Due   Tetanus Vaccine  Never done   Zoster (Shingles) Vaccine (1 of 2) Never done   Pneumonia Vaccine (2 - PCV) 04/24/2022   Colon Cancer Screening  01/19/2023   Flu Shot  Completed   Hepatitis C Screening: USPSTF Recommendation to screen - Ages 18-79 yo.  Completed   HPV Vaccine  Aged Out   COVID-19 Vaccine  Discontinued    Advanced directives: Yes  Conditions/risks identified: Yes  Next appointment: Follow up in one year for your annual wellness visit.   Preventive Care 56 Years and Older, Male  Preventive care refers to lifestyle choices and visits with your health care provider that can promote health and wellness. What does preventive care include? A yearly physical exam. This is also called an annual well check. Dental exams once or twice a year. Routine eye exams. Ask your health care provider how often you should have your eyes checked. Personal lifestyle choices, including: Daily care of your teeth and gums. Regular physical activity. Eating a healthy diet. Avoiding tobacco and drug use. Limiting alcohol use. Practicing safe sex. Taking low doses of aspirin every day. Taking vitamin and mineral supplements as recommended by your health care provider. What happens during an annual well check? The services and screenings done by your health care provider during your annual well check will depend on your age, overall health, lifestyle risk factors, and family history of disease. Counseling  Your health care provider may ask you questions about  your: Alcohol use. Tobacco use. Drug use. Emotional well-being. Home and relationship well-being. Sexual activity. Eating habits. History of falls. Memory and ability to understand (cognition). Work and work Statistician. Screening  You may have the following tests or measurements: Height, weight, and BMI. Blood pressure. Lipid and cholesterol levels. These may be checked every 5 years, or more frequently if you are over 65 years old. Skin check. Lung cancer screening. You may have this screening every year starting at age 21 if you have a 30-pack-year history of smoking and currently smoke or have quit within the past 15 years. Fecal occult blood test (FOBT) of the stool. You may have this test every year starting at age 19. Flexible sigmoidoscopy or colonoscopy. You may have a sigmoidoscopy every 5 years or a colonoscopy every 10 years starting at age 31. Prostate cancer screening. Recommendations will vary depending on your family history and other risks. Hepatitis C blood test. Hepatitis B blood test. Sexually transmitted disease (STD) testing. Diabetes screening. This is done by checking your blood sugar (glucose) after you have not eaten for a while (fasting). You may have this done every 1-3 years. Abdominal aortic aneurysm (AAA) screening. You may need this if you are a current or former smoker. Osteoporosis. You may be screened starting at age 67 if you are at high risk. Talk with your health care provider about your test results, treatment options, and if necessary, the need for more tests. Vaccines  Your health  care provider may recommend certain vaccines, such as: Influenza vaccine. This is recommended every year. Tetanus, diphtheria, and acellular pertussis (Tdap, Td) vaccine. You may need a Td booster every 10 years. Zoster vaccine. You may need this after age 21. Pneumococcal 13-valent conjugate (PCV13) vaccine. One dose is recommended after age 15. Pneumococcal  polysaccharide (PPSV23) vaccine. One dose is recommended after age 66. Talk to your health care provider about which screenings and vaccines you need and how often you need them. This information is not intended to replace advice given to you by your health care provider. Make sure you discuss any questions you have with your health care provider. Document Released: 11/30/2015 Document Revised: 07/23/2016 Document Reviewed: 09/04/2015 Elsevier Interactive Patient Education  2017 [place] Prevention in the Home Falls can cause injuries. They can happen to people of all ages. There are many things you can do to make your home safe and to help prevent falls. What can I do on the outside of my home? Regularly fix the edges of walkways and driveways and fix any cracks. Remove anything that might make you trip as you walk through a door, such as a raised step or threshold. Trim any bushes or trees on the path to your home. Use bright outdoor lighting. Clear any walking paths of anything that might make someone trip, such as rocks or tools. Regularly check to see if handrails are loose or broken. Make sure that both sides of any steps have handrails. Any raised decks and porches should have guardrails on the edges. Have any leaves, snow, or ice cleared regularly. Use sand or salt on walking paths during winter. Clean up any spills in your garage right away. This includes oil or grease spills. What can I do in the bathroom? Use night lights. Install grab bars by the toilet and in the tub and shower. Do not use towel bars as grab bars. Use non-skid mats or decals in the tub or shower. If you need to sit down in the shower, use a plastic, non-slip stool. Keep the floor dry. Clean up any water that spills on the floor as soon as it happens. Remove soap buildup in the tub or shower regularly. Attach bath mats securely with double-sided non-slip rug tape. Do not have throw rugs and other  things on the floor that can make you trip. What can I do in the bedroom? Use night lights. Make sure that you have a light by your bed that is easy to reach. Do not use any sheets or blankets that are too big for your bed. They should not hang down onto the floor. Have a firm chair that has side arms. You can use this for support while you get dressed. Do not have throw rugs and other things on the floor that can make you trip. What can I do in the kitchen? Clean up any spills right away. Avoid walking on wet floors. Keep items that you use a lot in easy-to-reach places. If you need to reach something above you, use a strong step stool that has a grab bar. Keep electrical cords out of the way. Do not use floor polish or wax that makes floors slippery. If you must use wax, use non-skid floor wax. Do not have throw rugs and other things on the floor that can make you trip. What can I do with my stairs? Do not leave any items on the stairs. Make sure that there are handrails on  both sides of the stairs and use them. Fix handrails that are broken or loose. Make sure that handrails are as long as the stairways. Check any carpeting to make sure that it is firmly attached to the stairs. Fix any carpet that is loose or worn. Avoid having throw rugs at the top or bottom of the stairs. If you do have throw rugs, attach them to the floor with carpet tape. Make sure that you have a light switch at the top of the stairs and the bottom of the stairs. If you do not have them, ask someone to add them for you. What else can I do to help prevent falls? Wear shoes that: Do not have high heels. Have rubber bottoms. Are comfortable and fit you well. Are closed at the toe. Do not wear sandals. If you use a stepladder: Make sure that it is fully opened. Do not climb a closed stepladder. Make sure that both sides of the stepladder are locked into place. Ask someone to hold it for you, if possible. Clearly  mark and make sure that you can see: Any grab bars or handrails. First and last steps. Where the edge of each step is. Use tools that help you move around (mobility aids) if they are needed. These include: Canes. Walkers. Scooters. Crutches. Turn on the lights when you go into a dark area. Replace any light bulbs as soon as they burn out. Set up your furniture so you have a clear path. Avoid moving your furniture around. If any of your floors are uneven, fix them. If there are any pets around you, be aware of where they are. Review your medicines with your doctor. Some medicines can make you feel dizzy. This can increase your chance of falling. Ask your doctor what other things that you can do to help prevent falls. This information is not intended to replace advice given to you by your health care provider. Make sure you discuss any questions you have with your health care provider. Document Released: 08/30/2009 Document Revised: 04/10/2016 Document Reviewed: 12/08/2014 Elsevier Interactive Patient Education  2017 [employer].

## 2022-09-15 NOTE — Telephone Encounter (Signed)
Left v-msg for patient to return call to office.  Mignon Pine, LPN.

## 2022-09-16 ENCOUNTER — Ambulatory Visit: Payer: Medicare Other

## 2022-09-16 ENCOUNTER — Ambulatory Visit (INDEPENDENT_AMBULATORY_CARE_PROVIDER_SITE_OTHER): Payer: Medicare Other | Admitting: *Deleted

## 2022-09-16 DIAGNOSIS — D510 Vitamin B12 deficiency anemia due to intrinsic factor deficiency: Secondary | ICD-10-CM

## 2022-09-16 MED ORDER — CYANOCOBALAMIN 1000 MCG/ML IJ SOLN
1000.0000 ug | Freq: Once | INTRAMUSCULAR | Status: AC
  Administered 2022-09-16: 1000 ug via INTRAMUSCULAR

## 2022-09-16 NOTE — Progress Notes (Signed)
Administered B12 1000 mcg/ml left deltoid. Pt tolerated well.

## 2022-10-14 ENCOUNTER — Ambulatory Visit (INDEPENDENT_AMBULATORY_CARE_PROVIDER_SITE_OTHER): Payer: Medicare Other | Admitting: *Deleted

## 2022-10-14 DIAGNOSIS — D510 Vitamin B12 deficiency anemia due to intrinsic factor deficiency: Secondary | ICD-10-CM

## 2022-10-14 MED ORDER — CYANOCOBALAMIN 1000 MCG/ML IJ SOLN
1000.0000 ug | Freq: Once | INTRAMUSCULAR | Status: AC
  Administered 2022-10-14: 1000 ug via INTRAMUSCULAR

## 2022-10-14 NOTE — Progress Notes (Signed)
Pls cosign for B12 inj../lmb  

## 2022-11-11 ENCOUNTER — Ambulatory Visit (INDEPENDENT_AMBULATORY_CARE_PROVIDER_SITE_OTHER): Payer: Medicare Other | Admitting: Internal Medicine

## 2022-11-11 ENCOUNTER — Encounter: Payer: Self-pay | Admitting: Internal Medicine

## 2022-11-11 VITALS — BP 130/76 | HR 69 | Temp 98.9°F | Ht 69.0 in | Wt 163.0 lb

## 2022-11-11 DIAGNOSIS — J301 Allergic rhinitis due to pollen: Secondary | ICD-10-CM | POA: Diagnosis not present

## 2022-11-11 DIAGNOSIS — E785 Hyperlipidemia, unspecified: Secondary | ICD-10-CM | POA: Diagnosis not present

## 2022-11-11 DIAGNOSIS — I10 Essential (primary) hypertension: Secondary | ICD-10-CM

## 2022-11-11 DIAGNOSIS — R739 Hyperglycemia, unspecified: Secondary | ICD-10-CM | POA: Diagnosis not present

## 2022-11-11 DIAGNOSIS — N1831 Chronic kidney disease, stage 3a: Secondary | ICD-10-CM

## 2022-11-11 DIAGNOSIS — R0981 Nasal congestion: Secondary | ICD-10-CM

## 2022-11-11 LAB — BASIC METABOLIC PANEL
BUN: 17 mg/dL (ref 6–23)
CO2: 22 mEq/L (ref 19–32)
Calcium: 9.5 mg/dL (ref 8.4–10.5)
Chloride: 102 mEq/L (ref 96–112)
Creatinine, Ser: 1.11 mg/dL (ref 0.40–1.50)
GFR: 64.08 mL/min (ref >60.00)
Glucose, Bld: 93 mg/dL (ref 70–99)
Potassium: 4.4 mEq/L (ref 3.5–5.1)
Sodium: 135 mEq/L (ref 135–145)

## 2022-11-11 LAB — URINALYSIS, ROUTINE W REFLEX MICROSCOPIC
Bilirubin Urine: NEGATIVE
Hgb urine dipstick: NEGATIVE
Ketones, ur: NEGATIVE
Leukocytes,Ua: NEGATIVE
Nitrite: NEGATIVE
RBC / HPF: NONE SEEN (ref 0–?)
Specific Gravity, Urine: <=1.005 — AB (ref 1.000–1.030)
Total Protein, Urine: NEGATIVE
Urine Glucose: NEGATIVE
Urobilinogen, UA: 0.2 (ref 0.0–1.0)
WBC, UA: NONE SEEN (ref 0–?)
pH: 6 (ref 5.0–8.0)

## 2022-11-11 LAB — HEMOGLOBIN A1C: Hgb A1c MFr Bld: 5.6 % (ref 4.6–6.5)

## 2022-11-11 MED ORDER — ROSUVASTATIN CALCIUM 10 MG PO TABS: 10.0000 mg | ORAL_TABLET | Freq: Every day | ORAL | 1 refills | Status: DC

## 2022-11-11 MED ORDER — MONTELUKAST SODIUM 10 MG PO TABS: 10.0000 mg | ORAL_TABLET | Freq: Every day | ORAL | 1 refills | Status: DC

## 2022-11-11 MED ORDER — LEVOCETIRIZINE DIHYDROCHLORIDE 5 MG PO TABS: 5.0000 mg | ORAL_TABLET | Freq: Every evening | ORAL | 1 refills | Status: DC

## 2022-11-11 MED ORDER — FLUTICASONE PROPIONATE 50 MCG/ACT NA SUSP: 2.0000 | Freq: Every day | NASAL | 1 refills | Status: DC

## 2022-11-11 NOTE — Patient Instructions (Signed)
Allergic Rhinitis, Adult  Allergic rhinitis is an allergic reaction that affects the mucous membrane inside the nose. The mucous membrane is the tissue that produces mucus. There are two types of allergic rhinitis: Seasonal. This type is also called hay fever and happens only during certain seasons. Perennial. This type can happen at any time of the year. Allergic rhinitis cannot be spread from person to person. This condition can be mild, moderate, or severe. It can develop at any age and may be outgrown. What are the causes? This condition is caused by allergens. These are things that can cause an allergic reaction. Allergens may differ for seasonal allergic rhinitis and perennial allergic rhinitis. Seasonal allergic rhinitis is triggered by pollen. Pollen can come from grasses, trees, and weeds. Perennial allergic rhinitis may be triggered by: Dust mites. Proteins in a pet's urine, saliva, or dander. Dander is dead skin cells from a pet. Smoke, mold, or car fumes. What increases the risk? You are more likely to develop this condition if you have a family history of allergies or other conditions related to allergies, including: Allergic conjunctivitis. This is inflammation of parts of the eyes and eyelids. Asthma. This condition affects the lungs and makes it hard to breathe. Atopic dermatitis or eczema. This is long term (chronic) inflammation of the skin. Food allergies. What are the signs or symptoms? Symptoms of this condition include: Sneezing or coughing. A stuffy nose (nasal congestion), itchy nose, or nasal discharge. Itchy eyes and tearing of the eyes. A feeling of mucus dripping down the back of your throat (postnasal drip). Trouble sleeping. Tiredness or fatigue. Headache. Sore throat. How is this diagnosed? This condition may be diagnosed with your symptoms, medical history, and physical exam. Your health care provider may check for related conditions, such  as: Asthma. Pink eye. This is eye inflammation caused by infection (conjunctivitis). Ear infection. Upper respiratory infection. This is an infection in the nose, throat, or upper airways. You may also have tests to find out which allergens trigger your symptoms. These may include skin tests or blood tests. How is this treated? There is no cure for this condition, but treatment can help control symptoms. Treatment may include: Taking medicines that block allergy symptoms, such as corticosteroids and antihistamines. Medicine may be given as a shot, nasal spray, or pill. Avoiding any allergens. Being exposed again and again to tiny amounts of allergens to help you build a defense against allergens (immunotherapy). This is done if other treatments have not helped. It may include: Allergy shots. These are injected medicines that have small amounts of allergen in them. Sublingual immunotherapy. This involves taking small doses of a medicine with allergen in it under your tongue. If these treatments do not work, your health care provider may prescribe newer, stronger medicines. Follow these instructions at home: Avoiding allergens Find out what you are allergic to and avoid those allergens. These are some things you can do to help avoid allergens: If you have perennial allergies: Replace carpet with wood, tile, or vinyl flooring. Carpet can trap dander and dust. Do not smoke. Do not allow smoking in your home. Change your heating and air conditioning filters at least once a month. If you have seasonal allergies, take these steps during allergy season: Keep windows closed as much as possible. Plan outdoor activities when pollen counts are lowest. Check pollen counts before you plan outdoor activities. When coming indoors, change clothing and shower before sitting on furniture or bedding. If you have a pet   in the house that produces allergens: Keep the pet out of the bedroom. Vacuum, sweep, and  dust regularly. General instructions Take over-the-counter and prescription medicines only as told by your health care provider. Drink enough fluid to keep your urine pale yellow. Keep all follow-up visits as told by your health care provider. This is important. Where to find more information American Academy of Allergy, Asthma & Immunology: www.aaaai.org Contact a health care provider if: You have a fever. You develop a cough that does not go away. You make whistling sounds when you breathe (wheeze). Your symptoms slow you down or stop you from doing your normal activities each day. Get help right away if: You have shortness of breath. This symptom may represent a serious problem that is an emergency. Do not wait to see if the symptom will go away. Get medical help right away. Call your local emergency services (911 in the [place] Do not drive yourself to the hospital. Summary Allergic rhinitis may be managed by taking medicines as directed and avoiding allergens. If you have seasonal allergies, keep windows closed as much as possible during allergy season. Contact your health care provider if you develop a fever or a cough that does not go away. This information is not intended to replace advice given to you by your health care provider. Make sure you discuss any questions you have with your health care provider. Document Revised: 04/17/2022 Document Reviewed: 11/01/2019 Elsevier Patient Education  2023 Elsevier Inc.  

## 2022-11-11 NOTE — Progress Notes (Unsigned)
Subjective:  Patient ID: Charles Robles, male    DOB: October 09, 1946  Age: 76 y.o. MRN: [id number]  CC: Hypertension and Allergic Rhinitis    HPI Charles Robles presents for f/up -  He is very active and denies DOE, CP, SOB, edema. He complains of dizziness and would like to stop taking the ARB.  Outpatient Medications Prior to Visit  Medication Sig Dispense Refill   amoxicillin (AMOXIL) 500 MG tablet Take 4 tablets (2,000 mg total) by mouth as directed. Take 4 tablets 1 hour prior to dental work, including cleanings. 12 tablet 12   esomeprazole (NEXIUM) 40 MG capsule Take 1 capsule (40 mg total) by mouth daily. 90 capsule 1   fish oil-omega-3 fatty acids 1000 MG capsule Take 1 g by mouth daily. (Patient not taking: Reported on 09/04/2022)     nystatin cream (MYCOSTATIN) Apply 1 application topically 2 (two) times daily. (Patient not taking: Reported on 09/04/2022) 30 g 1   sildenafil (VIAGRA) 100 MG tablet Take 1 tablet (100 mg total) by mouth daily as needed for erectile dysfunction. (Patient not taking: Reported on 09/04/2022) 10 tablet 3   sucralfate (CARAFATE) 1 g tablet Take 1 tablet (1 g total) by mouth 4 (four) times daily -  with meals and at bedtime. (Patient not taking: Reported on 11/11/2022) 360 tablet 0   levocetirizine (XYZAL) 5 MG tablet Take 1 tablet (5 mg total) by mouth every evening. 90 tablet 1   olmesartan (BENICAR) 20 MG tablet TAKE 1 TABLET(20 MG) BY MOUTH DAILY. 30 tablet 2   rosuvastatin (CRESTOR) 10 MG tablet Take 1 tablet (10 mg total) by mouth daily. 90 tablet 0   No facility-administered medications prior to visit.    ROS Review of Systems  Constitutional: Negative.  Negative for diaphoresis and fatigue.  HENT:  Positive for congestion, postnasal drip, rhinorrhea and sneezing. Negative for nosebleeds, sinus pressure, sinus pain, sore throat and trouble swallowing.   Eyes: Negative.   Respiratory: Negative.  Negative for cough, chest  tightness, shortness of breath and wheezing.   Cardiovascular:  Negative for chest pain, palpitations and leg swelling.  Gastrointestinal:  Negative for abdominal pain, diarrhea, nausea and vomiting.  Endocrine: Negative.   Genitourinary: Negative.  Negative for difficulty urinating.  Musculoskeletal: Negative.  Negative for arthralgias and myalgias.  Skin: Negative.  Negative for color change and rash.  Allergic/Immunologic: Negative.   Neurological:  Positive for dizziness and light-headedness. Negative for weakness and headaches.  Hematological:  Negative for adenopathy. Does not bruise/bleed easily.  Psychiatric/Behavioral: Negative.      Objective:  BP 130/76 (BP Location: Right Arm, Patient Position: Sitting, Cuff Size: Large)   Pulse 69   Temp 98.9 F (37.2 C) (Oral)   Ht '5\' 9"'$  (1.753 m)   Wt 163 lb (73.9 kg)   SpO2 95%   BMI 24.07 kg/m   BP Readings from Last 3 Encounters:  11/11/22 130/76  09/04/22 124/70  08/12/22 126/82    Wt Readings from Last 3 Encounters:  11/11/22 163 lb (73.9 kg)  09/04/22 165 lb 3.2 oz (74.9 kg)  08/12/22 162 lb (73.5 kg)    Physical Exam Vitals reviewed.  Constitutional:      Appearance: Normal appearance.  HENT:     Nose: Mucosal edema, congestion and rhinorrhea present. Rhinorrhea is clear.     Right Nostril: No epistaxis.     Left Nostril: No epistaxis.     Right Turbinates: Not enlarged.     Left  Turbinates: Not enlarged.     Right Sinus: No maxillary sinus tenderness or frontal sinus tenderness.     Left Sinus: No maxillary sinus tenderness or frontal sinus tenderness.     Mouth/Throat:     Mouth: Mucous membranes are moist.  Eyes:     General: No scleral icterus. Cardiovascular:     Rate and Rhythm: Normal rate and regular rhythm.     Heart sounds: Murmur heard.     Systolic murmur is present with a grade of 1/6.     No diastolic murmur is present.     No gallop.  Pulmonary:     Effort: Pulmonary effort is normal.      Breath sounds: No stridor. No wheezing, rhonchi or rales.  Abdominal:     General: Abdomen is flat.     Palpations: There is no mass.     Tenderness: There is no abdominal tenderness. There is no guarding.     Hernia: No hernia is present.  Musculoskeletal:     Cervical back: Neck supple.     Right lower leg: No edema.     Left lower leg: No edema.  Lymphadenopathy:     Cervical: No cervical adenopathy.  Skin:    General: Skin is warm and dry.     Coloration: Skin is not pale.  Neurological:     General: No focal deficit present.     Mental Status: He is alert.  Psychiatric:        Mood and Affect: Mood normal.        Behavior: Behavior normal.     Lab Results  Component Value Date   WBC 6.6 08/12/2022   HGB 13.7 08/12/2022   HCT 40.5 08/12/2022   PLT 163.0 08/12/2022   GLUCOSE 93 11/11/2022   CHOL 211 (H) 08/12/2022   TRIG 313.0 (H) 08/12/2022   HDL 87.20 08/12/2022   LDLDIRECT 96.0 08/12/2022   LDLCALC 161 (H) 02/21/2019   ALT 21 08/12/2022   AST 21 08/12/2022   NA 135 11/11/2022   K 4.4 11/11/2022   CL 102 11/11/2022   CREATININE 1.11 11/11/2022   BUN 17 11/11/2022   CO2 22 11/11/2022   TSH 0.95 09/17/2021   PSA 0.96 09/17/2021   INR 1.0 07/08/2019   HGBA1C 5.6 11/11/2022    CT Chest Wo Contrast  Result Date: 10/04/2021 CLINICAL DATA:  History of pulmonary nodule, melanoma, aortic valve replacement EXAM: CT CHEST WITHOUT CONTRAST TECHNIQUE: Multidetector CT imaging of the chest was performed following the standard protocol without IV contrast. COMPARISON:  07/07/2020 FINDINGS: Cardiovascular: Limited without contrast. Thoracic aortic atherosclerosis and native coronary atherosclerosis scattered calcifications. Thoracic aorta normal in caliber. Negative for aneurysm. No mediastinal hemorrhage or hematoma. Previous TAVR noted. Normal heart size. Mediastinum/Nodes: No enlarged mediastinal or axillary lymph nodes. Thyroid gland, trachea, and esophagus demonstrate  no significant findings. Lungs/Pleura: Stable bibasilar subpleural scarring. Stable 3 mm calcified granuloma in the right lung base. No acute airspace process, collapse or consolidation. No new or enlarging pulmonary nodule. Trachea and central airways are calcified but patent. No pleural abnormality, effusion, or pneumothorax. Upper Abdomen: Small hiatal hernia. Abdominal aortic atherosclerosis. No acute upper abdominal finding. Splenic calcified granuloma. Musculoskeletal: Degenerative changes of the spine. No acute osseous finding IMPRESSION: Stable bibasilar subpleural scarring, mild. Stable 3 mm calcified granuloma right lower lobe. Remote TAVR. Native thoracic aortic and coronary atherosclerosis. No suspicious or enlarging pulmonary nodule. Aortic Atherosclerosis (ICD10-I70.0). Electronically Signed   By: Jerilynn Mages.  Shick  M.D.   On: 10/04/2021 09:24    Assessment & Plan:   Charles Robles was seen today for hypertension and allergic rhinitis .  Diagnoses and all orders for this visit:  Chronic hyperglycemia- A1C is 5.6%. -     Basic metabolic panel; Future -     Hemoglobin A1c; Future -     Urinalysis, Routine w reflex microscopic; Future -     Basic metabolic panel -     Hemoglobin A1c -     Urinalysis, Routine w reflex microscopic  Nasal congestion -     levocetirizine (XYZAL) 5 MG tablet; Take 1 tablet (5 mg total) by mouth every evening.  Dyslipidemia, goal LDL below 160- LDL goal achieved. Doing well on the statin  -     rosuvastatin (CRESTOR) 10 MG tablet; Take 1 tablet (10 mg total) by mouth daily.  Seasonal allergic rhinitis due to pollen -     montelukast (SINGULAIR) 10 MG tablet; Take 1 tablet (10 mg total) by mouth at bedtime. -     fluticasone (FLONASE) 50 MCG/ACT nasal spray; Place 2 sprays into both nostrils daily.  Primary hypertension- He is symptomatic. Will discontinue the ARB. -     Basic metabolic panel; Future -     Urinalysis, Routine w reflex microscopic; Future -      Basic metabolic panel -     Urinalysis, Routine w reflex microscopic  Stage 3a chronic kidney disease ([place])- Renal function has improved slightly. -     Basic metabolic panel; Future -     Urinalysis, Routine w reflex microscopic; Future -     Basic metabolic panel -     Urinalysis, Routine w reflex microscopic   I have discontinued Wynona Luna. Borchardt's olmesartan. I am also having him start on montelukast and fluticasone. Additionally, I am having him maintain his fish oil-omega-3 fatty acids, amoxicillin, nystatin cream, sildenafil, esomeprazole, sucralfate, levocetirizine, and rosuvastatin.  Meds ordered this encounter  Medications   levocetirizine (XYZAL) 5 MG tablet    Sig: Take 1 tablet (5 mg total) by mouth every evening.    Dispense:  90 tablet    Refill:  1   rosuvastatin (CRESTOR) 10 MG tablet    Sig: Take 1 tablet (10 mg total) by mouth daily.    Dispense:  90 tablet    Refill:  1   montelukast (SINGULAIR) 10 MG tablet    Sig: Take 1 tablet (10 mg total) by mouth at bedtime.    Dispense:  90 tablet    Refill:  1   fluticasone (FLONASE) 50 MCG/ACT nasal spray    Sig: Place 2 sprays into both nostrils daily.    Dispense:  48 g    Refill:  1     Follow-up: Return in about 6 months (around 05/13/2023).  Scarlette Calico, MD

## 2022-11-12 ENCOUNTER — Encounter: Payer: Self-pay | Admitting: Internal Medicine

## 2022-11-14 ENCOUNTER — Ambulatory Visit (INDEPENDENT_AMBULATORY_CARE_PROVIDER_SITE_OTHER): Payer: Medicare Other | Admitting: *Deleted

## 2022-11-14 DIAGNOSIS — D510 Vitamin B12 deficiency anemia due to intrinsic factor deficiency: Secondary | ICD-10-CM | POA: Diagnosis not present

## 2022-11-14 MED ORDER — CYANOCOBALAMIN 1000 MCG/ML IJ SOLN
1000.0000 ug | Freq: Once | INTRAMUSCULAR | Status: AC
  Administered 2022-11-14: 1000 ug via INTRAMUSCULAR

## 2022-11-14 NOTE — Progress Notes (Signed)
Pls cosign for B12 inj in absence of PCP../lmb   

## 2022-12-15 ENCOUNTER — Ambulatory Visit (INDEPENDENT_AMBULATORY_CARE_PROVIDER_SITE_OTHER): Payer: Medicare Other

## 2022-12-15 DIAGNOSIS — E538 Deficiency of other specified B group vitamins: Secondary | ICD-10-CM

## 2022-12-15 MED ORDER — CYANOCOBALAMIN 1000 MCG/ML IJ SOLN
1000.0000 ug | Freq: Once | INTRAMUSCULAR | Status: AC
  Administered 2022-12-15: 1000 ug via INTRAMUSCULAR

## 2022-12-15 NOTE — Progress Notes (Signed)
After obtaining consent, and per orders of Dr. Jones, injection of B12 given by Zyiere Rosemond P Arihant Pennings. Patient instructed to report any adverse reaction to me immediately.  

## 2023-01-15 ENCOUNTER — Encounter: Payer: Self-pay | Admitting: Gastroenterology

## 2023-01-15 ENCOUNTER — Ambulatory Visit (INDEPENDENT_AMBULATORY_CARE_PROVIDER_SITE_OTHER): Payer: Medicare Other

## 2023-01-15 DIAGNOSIS — D510 Vitamin B12 deficiency anemia due to intrinsic factor deficiency: Secondary | ICD-10-CM | POA: Diagnosis not present

## 2023-01-15 MED ORDER — CYANOCOBALAMIN 1000 MCG/ML IJ SOLN
1000.0000 ug | Freq: Once | INTRAMUSCULAR | Status: AC
  Administered 2023-01-15: 1000 ug via INTRAMUSCULAR

## 2023-01-15 NOTE — Progress Notes (Signed)
Pt given B12 w/o any complications. °

## 2023-02-12 ENCOUNTER — Ambulatory Visit (INDEPENDENT_AMBULATORY_CARE_PROVIDER_SITE_OTHER): Payer: Medicare Other

## 2023-02-12 DIAGNOSIS — E538 Deficiency of other specified B group vitamins: Secondary | ICD-10-CM | POA: Diagnosis not present

## 2023-02-12 MED ORDER — CYANOCOBALAMIN 1000 MCG/ML IJ SOLN
1000.0000 ug | Freq: Once | INTRAMUSCULAR | Status: AC
  Administered 2023-02-12: 1000 ug via INTRAMUSCULAR

## 2023-02-12 NOTE — Progress Notes (Signed)
Pt was given B12 injection with no complications. 

## 2023-02-20 ENCOUNTER — Other Ambulatory Visit: Payer: Self-pay | Admitting: Internal Medicine

## 2023-02-20 DIAGNOSIS — J301 Allergic rhinitis due to pollen: Secondary | ICD-10-CM

## 2023-03-18 ENCOUNTER — Ambulatory Visit (INDEPENDENT_AMBULATORY_CARE_PROVIDER_SITE_OTHER): Payer: Medicare Other

## 2023-03-18 DIAGNOSIS — E538 Deficiency of other specified B group vitamins: Secondary | ICD-10-CM

## 2023-03-18 MED ORDER — CYANOCOBALAMIN 1000 MCG/ML IJ SOLN
1000.0000 ug | Freq: Once | INTRAMUSCULAR | Status: AC
  Administered 2023-03-18: 1000 ug via INTRAMUSCULAR

## 2023-03-18 NOTE — Progress Notes (Signed)
After obtaining consent, and per orders of Dr. Jones, injection of B12 given by Veronique Warga P Chevis Weisensel. Patient instructed to report any adverse reaction to me immediately.  

## 2023-04-01 IMAGING — DX DG CHEST 2V
2 series · 2 of 2 positions shown · non-contrast
Comparison: Chest CT 07/06/2020.  Chest radiograph 07/12/1999 [DATE]

CLINICAL DATA: Chest pain for 1 month.  Anterior upper chest pain.

EXAM:
CHEST - 2 VIEW

[chest pa]
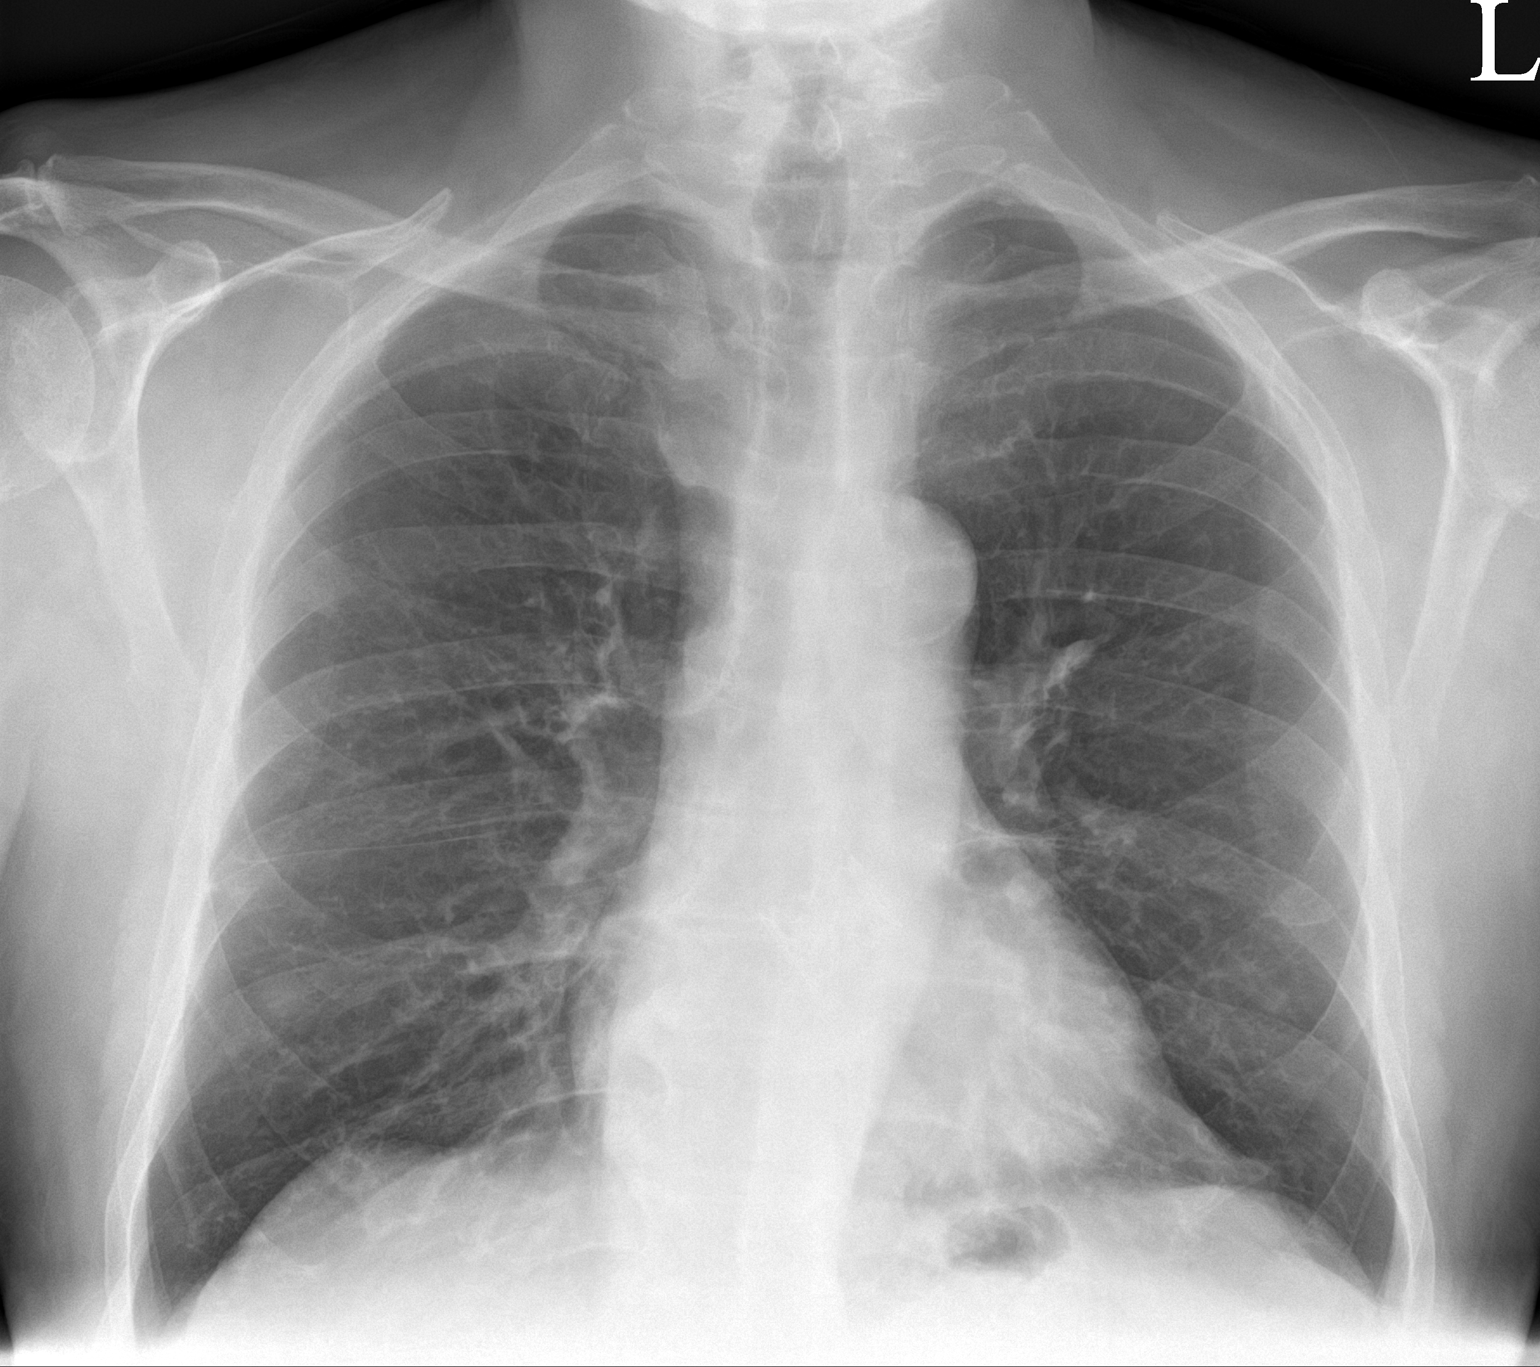

[chest lat]
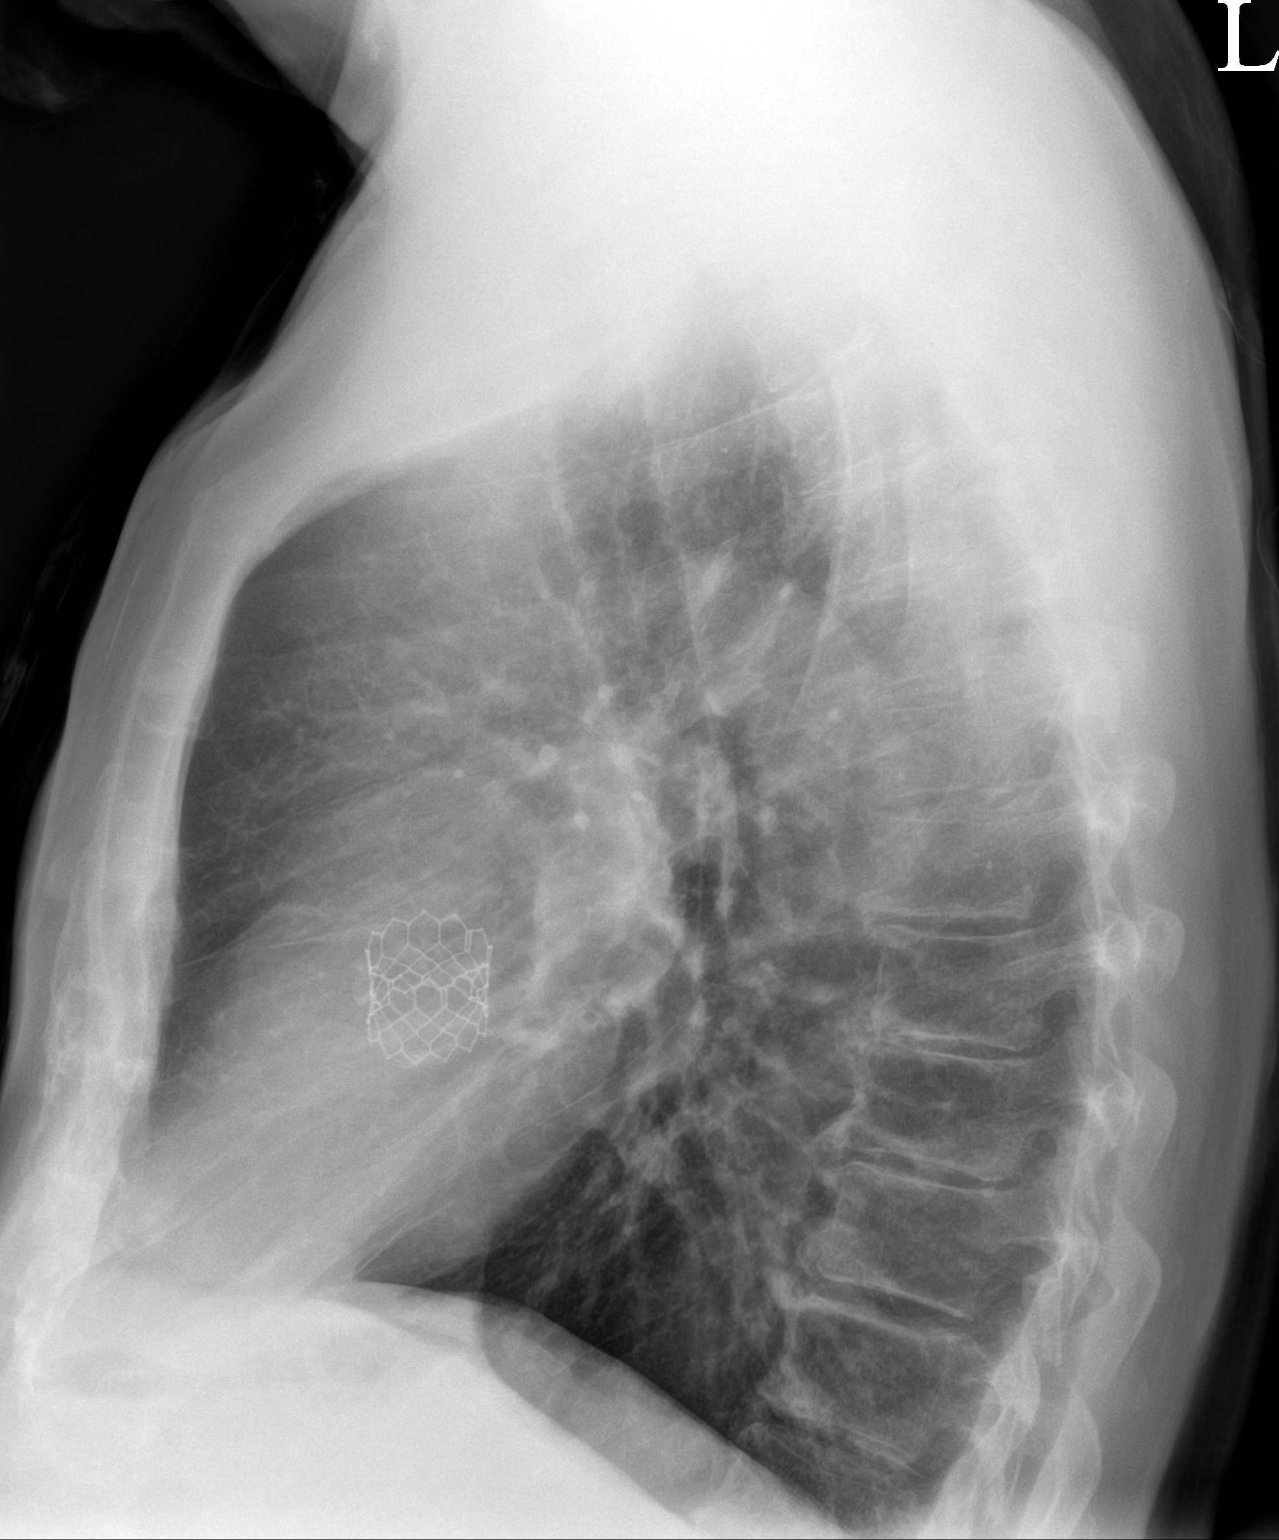

[2 of 2 positions shown; findings below may reference images not displayed]

FINDINGS: There are 2 subtle nodular densities in the lower chest bilaterally
which could represent nipple shadows. However, there is a
questionable retrocardiac nodular area on the frontal view which is
not appreciated on the lateral view. Upper lungs appear to be clear.
Heart size is normal. No large areas of airspace disease or
consolidation. Status post TAVR procedure. No large pleural
effusions. Atherosclerotic calcifications at the aortic arch.
IMPRESSION: Questionable nodular densities in lower chest. At least 2 of these
nodular densities could be nipple shadows. Indeterminate densities
in the retrocardiac space could represent overlying shadows but
indeterminate. Consider short-term follow-up two view chest exam
with nipple markers.

## 2023-04-01 IMAGING — DX DG LUMBAR SPINE COMPLETE 4+V
5 series · 5 of 5 positions shown · non-contrast
Comparison: None.

CLINICAL DATA: Low back pain

EXAM:
LUMBAR SPINE - COMPLETE 4+ VIEW

[l-spine ap]
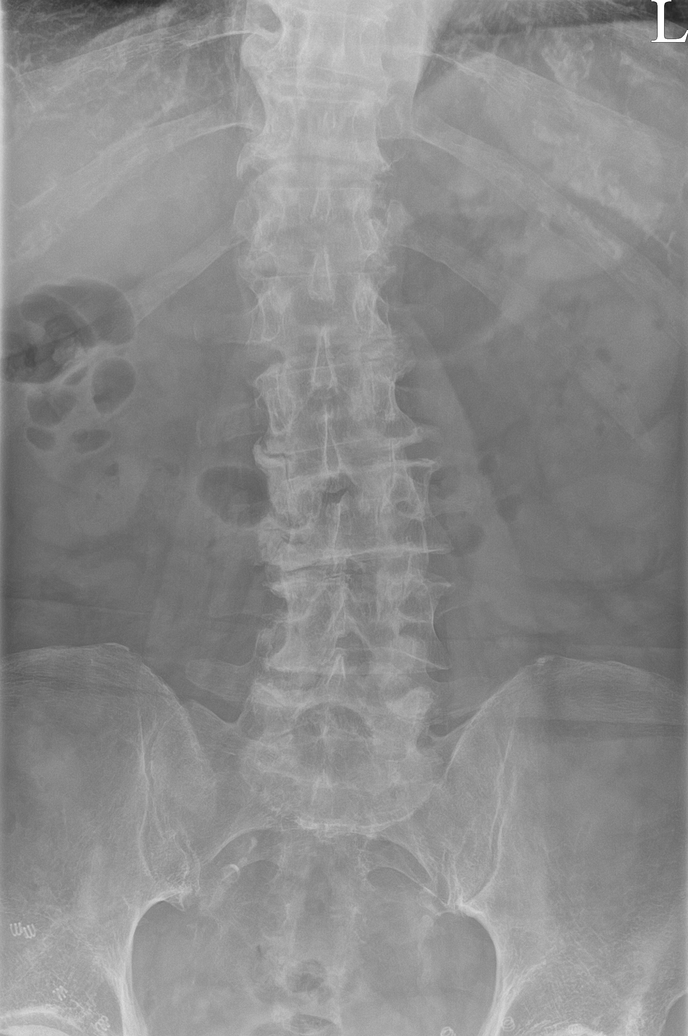

[l-spine obl (1 of 2)]
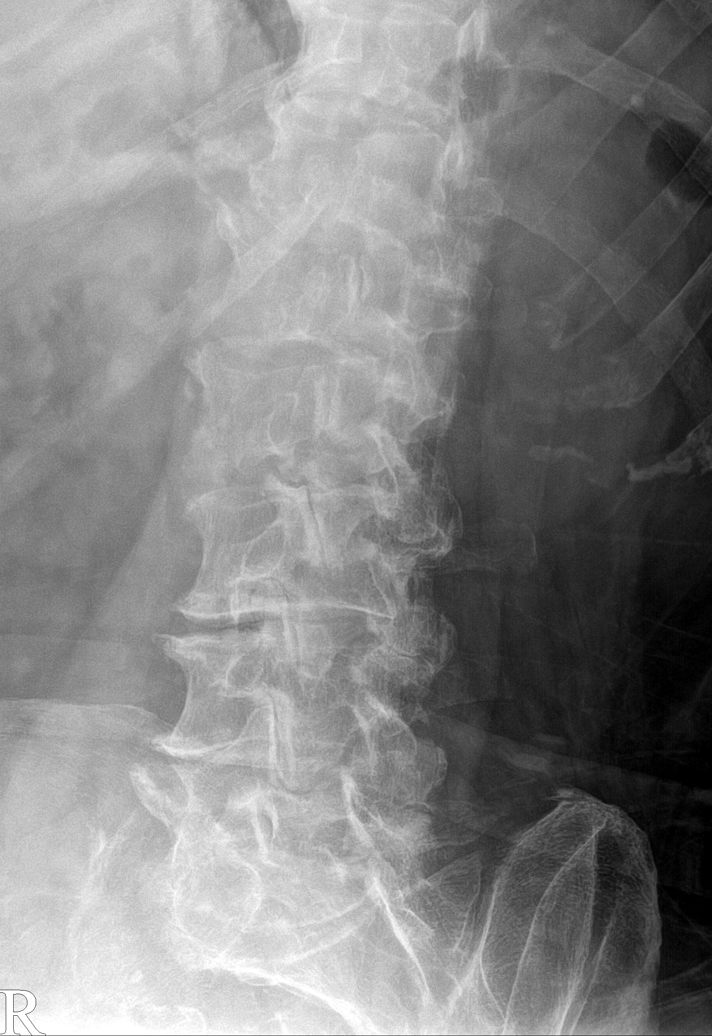

[l-spine obl (2 of 2)]
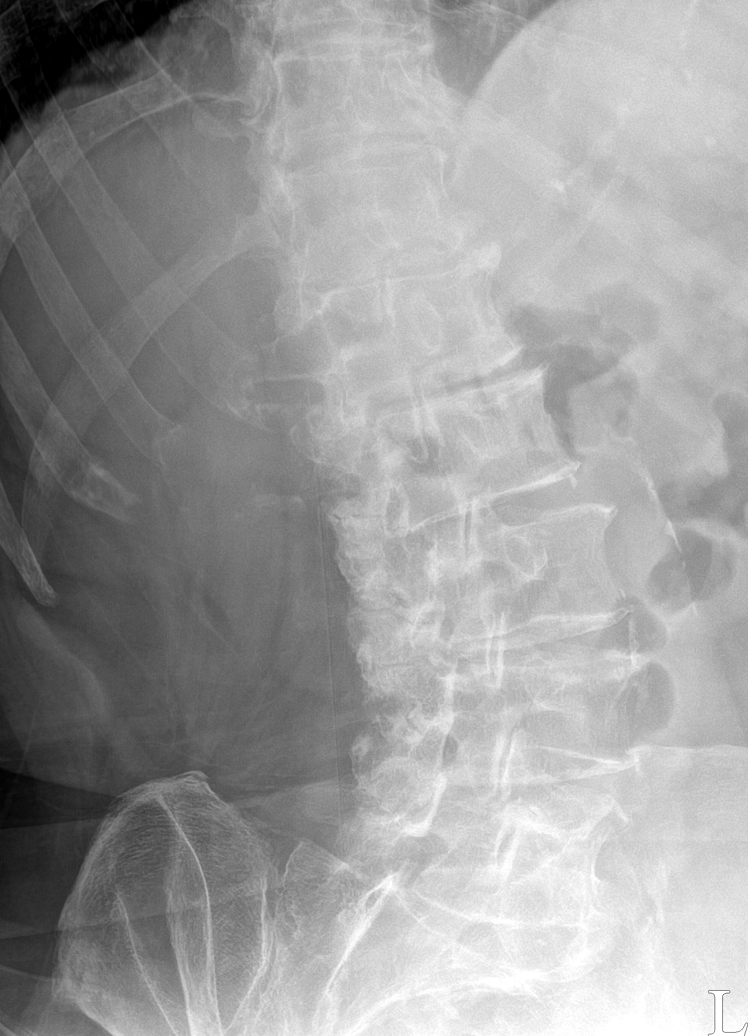

[l-spine lateral]
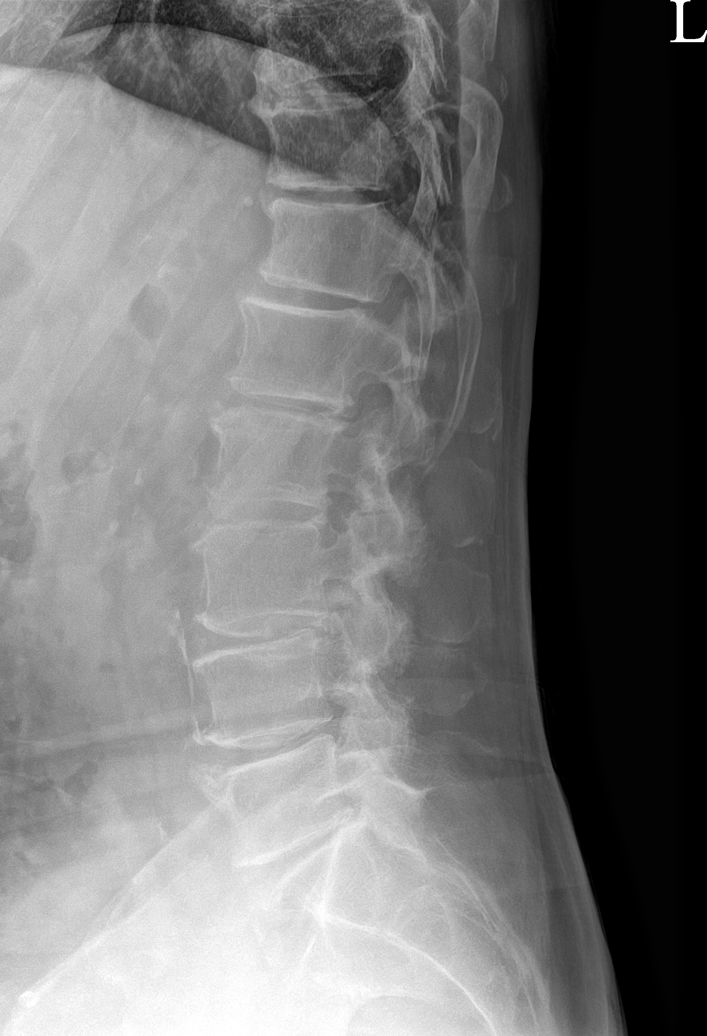

[l-spine spot]
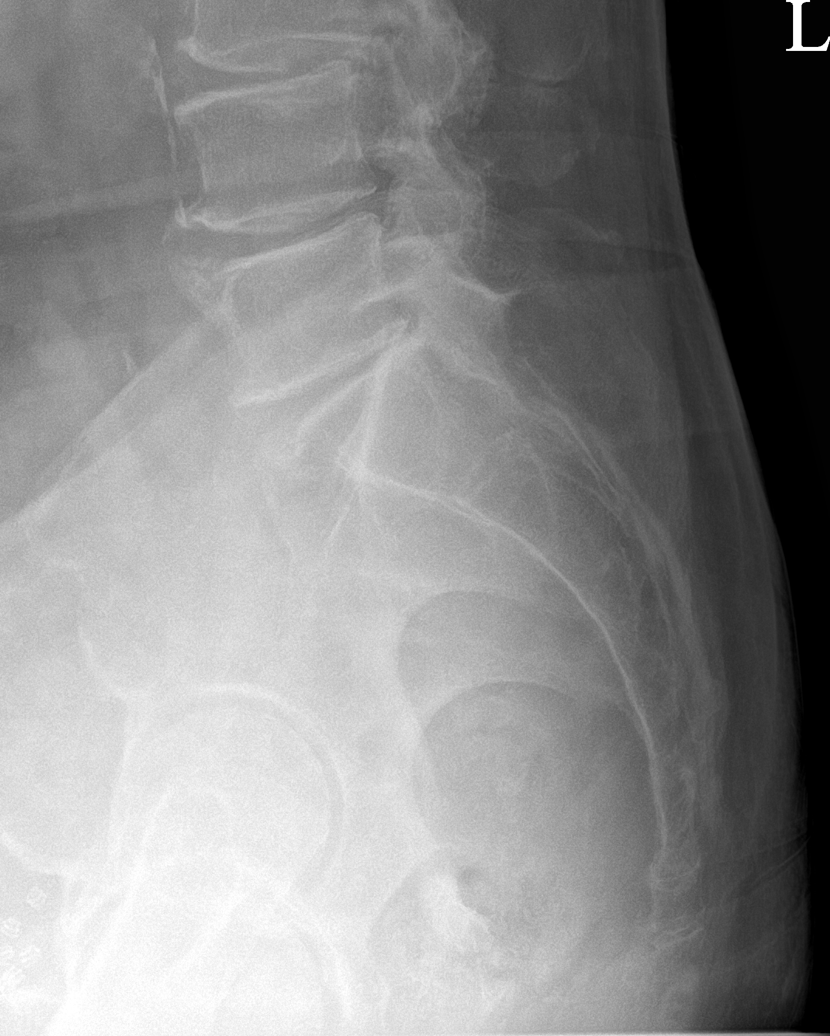

[5 of 5 positions shown; findings below may reference images not displayed]

FINDINGS: There is mild levoscoliosis. No recent fracture is seen. Alignment
of posterior margins of vertebral bodies is within normal limits.
Degenerative changes are noted with bony spurs, disc space narrowing
and facet hypertrophy at multiple levels, more so at L4-L5 and L5-S1
levels. Calcifications are seen in abdominal aorta. There is
possible previous right inguinal hernia repair.
IMPRESSION: No recent fracture is seen. Osteopenia. Severe degenerative changes
are noted with disc space narrowing, bony spurs and facet
hypertrophy at multiple levels, more so at L4-L5 and L5-S1 levels.

## 2023-04-20 ENCOUNTER — Ambulatory Visit (INDEPENDENT_AMBULATORY_CARE_PROVIDER_SITE_OTHER): Payer: Medicare Other | Admitting: Radiology

## 2023-04-20 DIAGNOSIS — E538 Deficiency of other specified B group vitamins: Secondary | ICD-10-CM

## 2023-04-20 MED ORDER — CYANOCOBALAMIN 1000 MCG/ML IJ SOLN
1000.0000 ug | Freq: Once | INTRAMUSCULAR | Status: AC
  Administered 2023-04-20: 1000 ug via INTRAMUSCULAR

## 2023-04-20 NOTE — Progress Notes (Signed)
Pt here for monthly B12 injection per Dr. JONES   B12 1000mcg given IM. and pt tolerated injection well.  

## 2023-05-19 ENCOUNTER — Telehealth: Payer: Self-pay | Admitting: Internal Medicine

## 2023-05-19 NOTE — Telephone Encounter (Signed)
Pt overdue for OV.  Scheduled pt for 21mo f/u 7/3 @ 10.40am.

## 2023-05-19 NOTE — Telephone Encounter (Signed)
Prescription Request  05/19/2023  LOV: 11/11/2022  What is the name of the medication or equipment? Rosuvastatin, montelukast, xyzol  Have you contacted your pharmacy to request a refill? Yes   Which pharmacy would you like this sent to?  [facility] [phone] - [place], [place] - [address] AT [facility] [address] [place] [address] Phone: [phone] Fax: [phone]    Patient notified that their request is being sent to the clinical staff for review and that they should receive a response within 2 business days.   Please advise at Mobile [phone] (mobile)

## 2023-05-20 ENCOUNTER — Ambulatory Visit: Payer: Medicare Other

## 2023-05-20 ENCOUNTER — Ambulatory Visit (INDEPENDENT_AMBULATORY_CARE_PROVIDER_SITE_OTHER): Payer: Medicare Other | Admitting: Internal Medicine

## 2023-05-20 ENCOUNTER — Encounter: Payer: Self-pay | Admitting: Internal Medicine

## 2023-05-20 VITALS — BP 134/80 | HR 63 | Temp 98.3°F | Ht 69.0 in | Wt 161.0 lb

## 2023-05-20 DIAGNOSIS — I10 Essential (primary) hypertension: Secondary | ICD-10-CM

## 2023-05-20 DIAGNOSIS — K2101 Gastro-esophageal reflux disease with esophagitis, with bleeding: Secondary | ICD-10-CM | POA: Diagnosis not present

## 2023-05-20 DIAGNOSIS — N1831 Chronic kidney disease, stage 3a: Secondary | ICD-10-CM | POA: Diagnosis not present

## 2023-05-20 DIAGNOSIS — J301 Allergic rhinitis due to pollen: Secondary | ICD-10-CM

## 2023-05-20 DIAGNOSIS — E785 Hyperlipidemia, unspecified: Secondary | ICD-10-CM | POA: Diagnosis not present

## 2023-05-20 DIAGNOSIS — R0981 Nasal congestion: Secondary | ICD-10-CM

## 2023-05-20 DIAGNOSIS — D510 Vitamin B12 deficiency anemia due to intrinsic factor deficiency: Secondary | ICD-10-CM | POA: Diagnosis not present

## 2023-05-20 MED ORDER — MONTELUKAST SODIUM 10 MG PO TABS: 10.0000 mg | ORAL_TABLET | Freq: Every day | ORAL | 1 refills | Status: DC

## 2023-05-20 MED ORDER — FLUTICASONE PROPIONATE 50 MCG/ACT NA SUSP
2.0000 | Freq: Every day | NASAL | 1 refills | Status: DC
Start: 2023-05-20 — End: 2023-12-01

## 2023-05-20 MED ORDER — ROSUVASTATIN CALCIUM 10 MG PO TABS: 10.0000 mg | ORAL_TABLET | Freq: Every day | ORAL | 1 refills | Status: DC

## 2023-05-20 MED ORDER — LEVOCETIRIZINE DIHYDROCHLORIDE 5 MG PO TABS: 5.0000 mg | ORAL_TABLET | Freq: Every evening | ORAL | 1 refills | Status: DC

## 2023-05-20 NOTE — Progress Notes (Signed)
Subjective:  Patient ID: Charles Robles, male    DOB: 1946-05-11  Age: 77 y.o. MRN: [id number]  CC: Hypertension   HPI Nasif Len presents for f/up -----  Discussed the use of AI scribe software for clinical note transcription with the patient, who gave verbal consent to proceed.  History of Present Illness   The patient, with a known history of B12 deficiency, presented for a follow-up visit and prescription refills. He reported occasional dizziness and lightheadedness, particularly when walking around. No chest pain or shortness of breath was reported during physical activity.  The patient's current medication regimen includes baby aspirin and rosuvastatin in the morning, and several medications in the evening, including Nexium, Flonase, Xyzal, Singulair, nystatin cream, generic Viagra, and Carafate. However, the patient seemed unsure about some of these medications and their purpose.  The patient reported minimal allergy symptoms, primarily nasal congestion. He admitted to occasional coughing and wheezing, but only after smoking cigars. He also reported a sprain or strain in the right shoulder, which has been improving with massage therapy. He noted occasional leg weakness when walking around.  The patient denied taking esomeprazole, a proton pump inhibitor, and did not feel he needed it. He was unsure about the effects of not taking his allergy medications, montelukast and levocetirizine, but agreed to renew these prescriptions.       Outpatient Medications Prior to Visit  Medication Sig Dispense Refill   amoxicillin (AMOXIL) 500 MG tablet Take 4 tablets (2,000 mg total) by mouth as directed. Take 4 tablets 1 hour prior to dental work, including cleanings. 12 tablet 12   esomeprazole (NEXIUM) 40 MG capsule Take 1 capsule (40 mg total) by mouth daily. 90 capsule 1   fish oil-omega-3 fatty acids 1000 MG capsule Take 1 g by mouth daily.     fluticasone (FLONASE)  50 MCG/ACT nasal spray Place 2 sprays into both nostrils daily. 48 g 1   levocetirizine (XYZAL) 5 MG tablet Take 1 tablet (5 mg total) by mouth every evening. 90 tablet 1   montelukast (SINGULAIR) 10 MG tablet TAKE 1 TABLET(10 MG) BY MOUTH AT BEDTIME 90 tablet 1   nystatin cream (MYCOSTATIN) Apply 1 application topically 2 (two) times daily. 30 g 1   rosuvastatin (CRESTOR) 10 MG tablet Take 1 tablet (10 mg total) by mouth daily. 90 tablet 1   sildenafil (VIAGRA) 100 MG tablet Take 1 tablet (100 mg total) by mouth daily as needed for erectile dysfunction. 10 tablet 3   sucralfate (CARAFATE) 1 g tablet Take 1 tablet (1 g total) by mouth 4 (four) times daily -  with meals and at bedtime. 360 tablet 0   No facility-administered medications prior to visit.    ROS Review of Systems  Constitutional: Negative.  Negative for diaphoresis and fatigue.  HENT:  Positive for congestion. Negative for facial swelling and sinus pressure.   Respiratory:  Negative for cough, chest tightness, shortness of breath and wheezing.   Cardiovascular:  Negative for chest pain, palpitations and leg swelling.  Gastrointestinal: Negative.  Negative for abdominal pain, constipation, diarrhea, nausea and vomiting.  Genitourinary: Negative.  Negative for difficulty urinating and dysuria.  Musculoskeletal: Negative.  Negative for arthralgias and myalgias.  Skin: Negative.   Neurological:  Negative for dizziness, weakness, light-headedness and headaches.  Hematological:  Negative for adenopathy. Does not bruise/bleed easily.  Psychiatric/Behavioral: Negative.      Objective:  BP 134/80 (BP Location: Left Arm, Patient Position: Sitting, Cuff Size: Large)  Pulse 63   Temp 98.3 F (36.8 C) (Oral)   Ht 5\' 9"  (1.753 m)   Wt 161 lb (73 kg)   SpO2 98%   BMI 23.78 kg/m   BP Readings from Last 3 Encounters:  05/20/23 134/80  11/11/22 130/76  09/04/22 124/70    Wt Readings from Last 3 Encounters:  05/20/23 161 lb (73  kg)  11/11/22 163 lb (73.9 kg)  09/04/22 165 lb 3.2 oz (74.9 kg)    Physical Exam Vitals reviewed.  Constitutional:      Appearance: Normal appearance.  HENT:     Mouth/Throat:     Mouth: Mucous membranes are moist.  Eyes:     General: No scleral icterus.    Conjunctiva/sclera: Conjunctivae normal.  Cardiovascular:     Rate and Rhythm: Normal rate and regular rhythm.     Heart sounds: Murmur heard.     Systolic murmur is present with a grade of 2/6.     No diastolic murmur is present.     No friction rub. No gallop.  Pulmonary:     Effort: Pulmonary effort is normal.     Breath sounds: No stridor. No wheezing, rhonchi or rales.  Abdominal:     General: Abdomen is flat.     Palpations: There is no mass.     Tenderness: There is no abdominal tenderness. There is no guarding.     Hernia: No hernia is present.  Musculoskeletal:     Cervical back: Neck supple.     Right lower leg: No edema.     Left lower leg: No edema.  Lymphadenopathy:     Cervical: No cervical adenopathy.  Skin:    General: Skin is warm and dry.  Neurological:     General: No focal deficit present.     Mental Status: He is alert.  Psychiatric:        Mood and Affect: Mood normal.        Behavior: Behavior normal.     Lab Results  Component Value Date   WBC 6.6 08/12/2022   HGB 13.7 08/12/2022   HCT 40.5 08/12/2022   PLT 163.0 08/12/2022   GLUCOSE 93 11/11/2022   CHOL 211 (H) 08/12/2022   TRIG 313.0 (H) 08/12/2022   HDL 87.20 08/12/2022   LDLDIRECT 96.0 08/12/2022   LDLCALC 161 (H) 02/21/2019   ALT 21 08/12/2022   AST 21 08/12/2022   NA 135 11/11/2022   K 4.4 11/11/2022   CL 102 11/11/2022   CREATININE 1.11 11/11/2022   BUN 17 11/11/2022   CO2 22 11/11/2022   TSH 0.95 09/17/2021   PSA 0.96 09/17/2021   INR 1.0 07/08/2019   HGBA1C 5.6 11/11/2022    CT Chest Wo Contrast  Result Date: 10/04/2021 CLINICAL DATA:  History of pulmonary nodule, melanoma, aortic valve replacement EXAM:  CT CHEST WITHOUT CONTRAST TECHNIQUE: Multidetector CT imaging of the chest was performed following the standard protocol without IV contrast. COMPARISON:  07/07/2020 FINDINGS: Cardiovascular: Limited without contrast. Thoracic aortic atherosclerosis and native coronary atherosclerosis scattered calcifications. Thoracic aorta normal in caliber. Negative for aneurysm. No mediastinal hemorrhage or hematoma. Previous TAVR noted. Normal heart size. Mediastinum/Nodes: No enlarged mediastinal or axillary lymph nodes. Thyroid gland, trachea, and esophagus demonstrate no significant findings. Lungs/Pleura: Stable bibasilar subpleural scarring. Stable 3 mm calcified granuloma in the right lung base. No acute airspace process, collapse or consolidation. No new or enlarging pulmonary nodule. Trachea and central airways are calcified but patent. No pleural abnormality,  effusion, or pneumothorax. Upper Abdomen: Small hiatal hernia. Abdominal aortic atherosclerosis. No acute upper abdominal finding. Splenic calcified granuloma. Musculoskeletal: Degenerative changes of the spine. No acute osseous finding IMPRESSION: Stable bibasilar subpleural scarring, mild. Stable 3 mm calcified granuloma right lower lobe. Remote TAVR. Native thoracic aortic and coronary atherosclerosis. No suspicious or enlarging pulmonary nodule. Aortic Atherosclerosis (ICD10-I70.0). Electronically Signed   By: Judie Petit.  Shick M.D.   On: 10/04/2021 09:24    Assessment & Plan:   Primary hypertension- His BP is well controlled. -     CBC with Differential/Platelet; Future -     Basic metabolic panel; Future  Nasal congestion -     Levocetirizine Dihydrochloride; Take 1 tablet (5 mg total) by mouth every evening.  Dispense: 90 tablet; Refill: 1  Dyslipidemia, goal LDL below 160 -     Rosuvastatin Calcium; Take 1 tablet (10 mg total) by mouth daily.  Dispense: 90 tablet; Refill: 1  Seasonal allergic rhinitis due to pollen -     Montelukast Sodium; Take 1  tablet (10 mg total) by mouth at bedtime.  Dispense: 90 tablet; Refill: 1 -     Fluticasone Propionate; Place 2 sprays into both nostrils daily.  Dispense: 48 g; Refill: 1  Stage 3a chronic kidney disease (HCC)- Will monitor his renal function. -     CBC with Differential/Platelet; Future -     Basic metabolic panel; Future  Gastroesophageal reflux disease with esophagitis and hemorrhage- Sx's are well controlled. -     CBC with Differential/Platelet; Future  Vitamin B12 deficiency anemia due to intrinsic factor deficiency -     CBC with Differential/Platelet; Future     Follow-up: No follow-ups on file.  Sanda Linger, MD

## 2023-07-06 ENCOUNTER — Ambulatory Visit: Payer: Medicare Other | Admitting: Physician Assistant

## 2023-07-08 DIAGNOSIS — H18603 Keratoconus, unspecified, bilateral: Secondary | ICD-10-CM | POA: Diagnosis not present

## 2023-07-08 DIAGNOSIS — H52203 Unspecified astigmatism, bilateral: Secondary | ICD-10-CM | POA: Diagnosis not present

## 2023-07-08 DIAGNOSIS — H2513 Age-related nuclear cataract, bilateral: Secondary | ICD-10-CM | POA: Diagnosis not present

## 2023-07-13 NOTE — Progress Notes (Unsigned)
Cardiology Office Note    Date:  07/15/2023  ID:  [name], [name] 1946-09-09, MRN [id number] PCP:  Etta Grandchild, MD  Cardiologist:  Donato Schultz, MD  Electrophysiologist:  None   Chief Complaint: f/u TAVR  History of Present Illness: .    Charles Robles is a 77 y.o. male with visit-pertinent history of severe AS s/p TAVR 06/2019, mild nonobstructive CAD by pre-TAVR cath 2019 (20% p-mRCA, 20% p-mLAD, 30% pCx, 30% mCx), HTN, HLD, pulmonary nodule, tobacco use seen for follow-up. Last echo 2021 showed EF 60-65%, G1DD, mild AS.Marland Kitchen CT chest showed stable granuloma RLL with otherwise no suspicious or enlarging pulm nodule, + aortic atherosclerosis.  He is seen for follow-up today doing well without any new CP, SOB, palpitations, edema, syncope or any complaints whatsoever from a cardiac standpoint. He is active chopping wood. He notes intermittent excess salivation and was directed to discuss with PCP. He is in between dentists right now, seeking to establish with a new one.  Labwork independently reviewed: 10/2022 K 4.4, Cr 1.11, A1C 5.6 07/2022 LDL 96, trig 313, Hgb 13.7, plt OK, LFTs ok 2022 TSH OK  ROS: .    Please see the history of present illness.  All other systems are reviewed and otherwise negative.  Studies Reviewed: Marland Kitchen    EKG:  EKG is ordered today, personally reviewed, demonstrating NSR 70bpm no acute STT changes  CV Studies: Cardiac studies reviewed are outlined and summarized above. Otherwise please see EMR for full report.   Current Reported Medications:.    Current Meds  Medication Sig   amoxicillin (AMOXIL) 500 MG tablet Take 4 tablets (2,000 mg total) by mouth as directed. Take 4 tablets 1 hour prior to dental work, including cleanings.   aspirin EC 81 MG tablet Take 81 mg by mouth daily. Swallow whole.   esomeprazole (NEXIUM) 40 MG capsule Take 1 capsule (40 mg total) by mouth daily.   fish oil-omega-3 fatty acids 1000 MG capsule Take 1 g by  mouth daily.   fluticasone (FLONASE) 50 MCG/ACT nasal spray Place 2 sprays into both nostrils daily.   levocetirizine (XYZAL) 5 MG tablet Take 1 tablet (5 mg total) by mouth every evening.   montelukast (SINGULAIR) 10 MG tablet Take 1 tablet (10 mg total) by mouth at bedtime.   rosuvastatin (CRESTOR) 10 MG tablet Take 1 tablet (10 mg total) by mouth daily.    Physical Exam:    VS:  BP 124/76   Pulse 70   Ht 5\' 9"  (1.753 m)   Wt 157 lb 12.8 oz (71.6 kg)   SpO2 98%   BMI 23.30 kg/m    Wt Readings from Last 3 Encounters:  07/15/23 157 lb 12.8 oz (71.6 kg)  05/20/23 161 lb (73 kg)  11/11/22 163 lb (73.9 kg)    GEN: Well nourished, well developed in no acute distress NECK: No JVD; No carotid bruits CARDIAC: RRR, no murmurs, rubs, gallops RESPIRATORY:  Clear to auscultation without rales, wheezing or rhonchi  ABDOMEN: Soft, non-tender, non-distended EXTREMITIES:  No edema; No acute deformity   Asessement and Plan:.    1. CAD - nonobstructive in 2019. No accelerating anginal symptoms. Continue ASA 81mg  daily, rosuvastatin 10mg  daily. He has eaten today. He does not have any follow-up scheduled with PCP at this time. Will have him return for fasting lipid panel, CMET, CBC in September.  2. Severe AS s/p TAVR - clinically stable. Due for updated echocardiogram, will arrange. SBE ppx  reviewed. Will send in refill for amoxicillin 500mg  tablets taking 4 tablets by mouth 30-60 minutes before dental work. Encouraged him to establish with new dentist as he intends.  3. HTN - currently controlled without any specific intervention, no new medicines advised today.  4. Hyperlipidemia - continue rosuvastatin 10mg  daily. Return for fasting labs above in September. If LDL is >70, anticipate statin titration with f/u labs to follow.    Disposition: F/u with Dr. Anne Fu in 1 year.  Signed, Laurann Montana, PA-C

## 2023-07-15 ENCOUNTER — Encounter: Payer: Self-pay | Admitting: Physician Assistant

## 2023-07-15 ENCOUNTER — Ambulatory Visit: Payer: Medicare Other | Attending: Physician Assistant | Admitting: Physician Assistant

## 2023-07-15 VITALS — BP 124/76 | HR 70 | Ht 69.0 in | Wt 157.8 lb

## 2023-07-15 DIAGNOSIS — E785 Hyperlipidemia, unspecified: Secondary | ICD-10-CM | POA: Diagnosis not present

## 2023-07-15 DIAGNOSIS — I2510 Atherosclerotic heart disease of native coronary artery without angina pectoris: Secondary | ICD-10-CM

## 2023-07-15 DIAGNOSIS — I10 Essential (primary) hypertension: Secondary | ICD-10-CM

## 2023-07-15 DIAGNOSIS — Z952 Presence of prosthetic heart valve: Secondary | ICD-10-CM | POA: Diagnosis not present

## 2023-07-15 MED ORDER — AMOXICILLIN 500 MG PO TABS: 2000.0000 mg | ORAL_TABLET | ORAL | 3 refills | Status: DC

## 2023-07-15 NOTE — Patient Instructions (Addendum)
Medication Instructions:  Your physician has recommended you make the following change in your medication:  1-TAKE amoxicillin 2000 mg (4 tablets) by mouth 30 to 60 minutes prior to dental work  *If you need a refill on your cardiac medications before your next appointment, please call your pharmacy*   Lab Work: Your physician recommends that you return for lab work in: 1 week for fasting lipid panel, CBC, CMET  If you have labs (blood work) drawn today and your tests are completely normal, you will receive your results only by: MyChart Message (if you have MyChart) OR A paper copy in the mail If you have any lab test that is abnormal or we need to change your treatment, we will call you to review the results.   Testing/Procedures: Your physician has requested that you have an echocardiogram. Echocardiography is a painless test that uses sound waves to create images of your heart. It provides your doctor with information about the size and shape of your heart and how well your heart's chambers and valves are working. This procedure takes approximately one hour. There are no restrictions for this procedure. Please do NOT wear cologne, perfume, aftershave, or lotions (deodorant is allowed). Please arrive 15 minutes prior to your appointment time.   Follow-Up: At [facility], you and your health needs are our priority.  As part of our continuing mission to provide you with exceptional heart care, we have created designated Provider Care Teams.  These Care Teams include your primary Cardiologist (physician) and Advanced Practice Providers (APPs -  Physician Assistants and Nurse Practitioners) who all work together to provide you with the care you need, when you need it.  We recommend signing up for the patient portal called "MyChart".  Sign up information is provided on this After Visit Summary.  MyChart is used to connect with patients for Virtual Visits (Telemedicine).  Patients are  able to view lab/test results, encounter notes, upcoming appointments, etc.  Non-urgent messages can be sent to your provider as well.   To learn more about what you can do with MyChart, go to [email or web address].    Your next appointment:   1 year(s)  Provider:   Donato Schultz, MD     Other Instructions  Endocarditis Information  You may be at risk for developing endocarditis since you have an artificial heart valve or a repaired heart valve. Endocarditis is an infection of the lining of the heart or heart valves. Certain surgical and dental procedures may put you at risk, such as teeth cleaning or other dental procedures or other medical procedures. You will need to take antibiotics before certain procedures. To prevent endocarditis, maintain good oral health. Seek prompt medical attention for any mouth/gum, skin or urinary tract infections.  You are due for your physical September of 2024. I would recomed

## 2023-07-23 ENCOUNTER — Ambulatory Visit: Payer: Medicare Other | Attending: Physician Assistant

## 2023-07-23 DIAGNOSIS — E785 Hyperlipidemia, unspecified: Secondary | ICD-10-CM | POA: Diagnosis not present

## 2023-07-23 DIAGNOSIS — I2510 Atherosclerotic heart disease of native coronary artery without angina pectoris: Secondary | ICD-10-CM

## 2023-07-23 DIAGNOSIS — Z952 Presence of prosthetic heart valve: Secondary | ICD-10-CM | POA: Diagnosis not present

## 2023-07-23 DIAGNOSIS — I10 Essential (primary) hypertension: Secondary | ICD-10-CM

## 2023-07-24 ENCOUNTER — Telehealth: Payer: Self-pay | Admitting: Physician Assistant

## 2023-07-24 DIAGNOSIS — E785 Hyperlipidemia, unspecified: Secondary | ICD-10-CM

## 2023-07-24 DIAGNOSIS — Z79899 Other long term (current) drug therapy: Secondary | ICD-10-CM

## 2023-07-24 LAB — CBC WITH DIFFERENTIAL/PLATELET
Basophils Absolute: 0 10*3/uL (ref 0.0–0.2)
Basos: 0 %
EOS (ABSOLUTE): 0.2 10*3/uL (ref 0.0–0.4)
Eos: 4 %
Hematocrit: 42.5 % (ref 37.5–51.0)
Hemoglobin: 14 g/dL (ref 13.0–17.7)
Immature Grans (Abs): 0 10*3/uL (ref 0.0–0.1)
Immature Granulocytes: 0 %
Lymphocytes Absolute: 1.9 10*3/uL (ref 0.7–3.1)
Lymphs: 33 %
MCH: 31.1 pg (ref 26.6–33.0)
MCHC: 32.9 g/dL (ref 31.5–35.7)
MCV: 94 fL (ref 79–97)
Monocytes Absolute: 0.6 10*3/uL (ref 0.1–0.9)
Monocytes: 11 %
Neutrophils Absolute: 3.1 10*3/uL (ref 1.4–7.0)
Neutrophils: 52 %
Platelets: 171 10*3/uL (ref 150–450)
RBC: 4.5 x10E6/uL (ref 4.14–5.80)
RDW: 12.5 % (ref 11.6–15.4)
WBC: 5.8 10*3/uL (ref 3.4–10.8)

## 2023-07-24 LAB — COMPREHENSIVE METABOLIC PANEL
ALT: 22 IU/L (ref 0–44)
AST: 21 IU/L (ref 0–40)
Albumin: 4.2 g/dL (ref 3.8–4.8)
Alkaline Phosphatase: 63 IU/L (ref 44–121)
BUN/Creatinine Ratio: 13 (ref 10–24)
BUN: 13 mg/dL (ref 8–27)
Bilirubin Total: 0.6 mg/dL (ref 0.0–1.2)
CO2: 24 mmol/L (ref 20–29)
Calcium: 9.1 mg/dL (ref 8.6–10.2)
Chloride: 105 mmol/L (ref 96–106)
Creatinine, Ser: 1.01 mg/dL (ref 0.76–1.27)
Globulin, Total: 2.4 g/dL (ref 1.5–4.5)
Glucose: 96 mg/dL (ref 70–99)
Potassium: 4.4 mmol/L (ref 3.5–5.2)
Sodium: 140 mmol/L (ref 134–144)
Total Protein: 6.6 g/dL (ref 6.0–8.5)
eGFR: 76 mL/min/{1.73_m2} (ref >59)

## 2023-07-24 LAB — LIPID PANEL
Chol/HDL Ratio: 2.3 ratio (ref 0.0–5.0)
Cholesterol, Total: 192 mg/dL (ref 100–199)
HDL: 83 mg/dL (ref >39)
LDL Chol Calc (NIH): 86 mg/dL (ref 0–99)
Triglycerides: 138 mg/dL (ref 0–149)
VLDL Cholesterol Cal: 23 mg/dL (ref 5–40)

## 2023-07-24 MED ORDER — ROSUVASTATIN CALCIUM 20 MG PO TABS: 20.0000 mg | ORAL_TABLET | Freq: Every day | ORAL | 3 refills | Status: DC

## 2023-07-24 NOTE — Telephone Encounter (Signed)
-----   Message from Laurann Montana sent at 07/24/2023  7:31 AM EDT ----- Please let pt know labs are all stable. LDL is not at goal given his h/o CAD. Recommend increase rosuvastatin to 20mg  daily if patient willing with f/u fasting lipid panel and LFTs in 2-3 months.

## 2023-07-24 NOTE — Telephone Encounter (Signed)
Discussed lab results with patient.  Per Ronie Spies, PA-C: Please let pt know labs are all stable. LDL is not at goal given his h/o CAD. Recommend increase rosuvastatin to 20mg  daily if patient willing with f/u fasting lipid panel and LFTs in 2-3 months.   Rosuvastatin 20mg  daily sent to [facility] pharmacy. Lipid panel and LFTs ordered, lab appt scheduled for 10/23/23.  Patient verbalized understanding and expressed appreciation for call.

## 2023-08-06 ENCOUNTER — Ambulatory Visit (HOSPITAL_COMMUNITY): Payer: Medicare Other | Attending: Physician Assistant

## 2023-08-06 DIAGNOSIS — Z952 Presence of prosthetic heart valve: Secondary | ICD-10-CM

## 2023-08-06 DIAGNOSIS — E785 Hyperlipidemia, unspecified: Secondary | ICD-10-CM | POA: Diagnosis not present

## 2023-08-06 DIAGNOSIS — I10 Essential (primary) hypertension: Secondary | ICD-10-CM

## 2023-08-06 DIAGNOSIS — I2510 Atherosclerotic heart disease of native coronary artery without angina pectoris: Secondary | ICD-10-CM | POA: Diagnosis not present

## 2023-08-06 LAB — ECHOCARDIOGRAM COMPLETE
AV Mean grad: 12 mmHg
AV Peak grad: 20.4 mmHg
Ao pk vel: 2.26 m/s
Area-P 1/2: 2.58 cm2
S' Lateral: 2.5 cm

## 2023-10-23 ENCOUNTER — Ambulatory Visit: Payer: Medicare Other | Attending: Physician Assistant

## 2023-10-23 DIAGNOSIS — E785 Hyperlipidemia, unspecified: Secondary | ICD-10-CM

## 2023-10-23 DIAGNOSIS — Z79899 Other long term (current) drug therapy: Secondary | ICD-10-CM | POA: Diagnosis not present

## 2023-10-23 LAB — LIPID PANEL
Chol/HDL Ratio: 2 {ratio} (ref 0.0–5.0)
Cholesterol, Total: 208 mg/dL — ABNORMAL HIGH (ref 100–199)
HDL: 105 mg/dL (ref >39)
LDL Chol Calc (NIH): 87 mg/dL (ref 0–99)
Triglycerides: 91 mg/dL (ref 0–149)
VLDL Cholesterol Cal: 16 mg/dL (ref 5–40)

## 2023-10-23 LAB — HEPATIC FUNCTION PANEL
ALT: 23 [IU]/L (ref 0–44)
AST: 22 [IU]/L (ref 0–40)
Albumin: 4.5 g/dL (ref 3.8–4.8)
Alkaline Phosphatase: 66 [IU]/L (ref 44–121)
Bilirubin Total: 0.5 mg/dL (ref 0.0–1.2)
Bilirubin, Direct: 0.22 mg/dL (ref 0.00–0.40)
Total Protein: 7.2 g/dL (ref 6.0–8.5)

## 2023-10-27 ENCOUNTER — Telehealth: Payer: Self-pay

## 2023-10-27 DIAGNOSIS — E785 Hyperlipidemia, unspecified: Secondary | ICD-10-CM

## 2023-10-27 DIAGNOSIS — Z79899 Other long term (current) drug therapy: Secondary | ICD-10-CM

## 2023-10-27 MED ORDER — EZETIMIBE 10 MG PO TABS: 10.0000 mg | ORAL_TABLET | Freq: Every day | ORAL | 3 refills | Status: DC

## 2023-10-27 NOTE — Telephone Encounter (Addendum)
Discussed lab results and recommendation with patient.  Zetia 10 mg daily sent to [facility] pharmacy. FLP ordered and released to [facility].  Patient verbalized understanding.  ----- Message from Jacolyn Reedy sent at 10/27/2023  7:52 AM EST ----- Covering for Dayna. LDL is not at goal at 87. Please add zetia 10 mg once daily and order repeat FLP in 3 months under Dayna's name. thanks

## 2023-10-28 ENCOUNTER — Telehealth: Payer: Self-pay | Admitting: Physician Assistant

## 2023-10-28 DIAGNOSIS — Z79899 Other long term (current) drug therapy: Secondary | ICD-10-CM

## 2023-10-28 MED ORDER — ROSUVASTATIN CALCIUM 40 MG PO TABS: 40.0000 mg | ORAL_TABLET | Freq: Every day | ORAL | 3 refills | Status: DC

## 2023-10-28 NOTE — Telephone Encounter (Signed)
Spoke with patient and he has not picked up Zetia Rx yet. Patient instructed to not pick up Rx Zetia and to increase Rosuvastatin( Crestor) to 40 mg once a day. Patient aware of fasting labs Lft/Lipid in 3 months labs and location to get them in 3 months . Medication list update and Pharmacy notified of changes.

## 2023-10-28 NOTE — Telephone Encounter (Signed)
Verbal orders reviewed, Zetia started by covering APP while I was out.  Please call patient - If he has picked up Zetia already, OK to continue plan as discussed, would just add LFTs to fasting lipid panel in 3 months.  If he did not pick up Zetia, please call pharmacy to cancel rx and please increase rosuvastatin to 40mg  daily with f/u fasting lipid panel/LFTs in 3 months. Thanks!

## 2023-11-03 ENCOUNTER — Telehealth: Payer: Self-pay | Admitting: Cardiology

## 2023-11-03 NOTE — Telephone Encounter (Signed)
Pt c/o medication issue:  1. Name of Medication:   rosuvastatin (CRESTOR) 40 MG tablet    2. How are you currently taking this medication (dosage and times per day)?   3. Are you having a reaction (difficulty breathing--STAT)?   4. What is your medication issue? Patient is calling to get clarification for this medication. He states that the previous dose to this med was 10 mg, but it is showing 20 mg. He states that going to 40 mg from 10 mg seems off and would like to speak to someone in regards to this.

## 2023-11-09 NOTE — Telephone Encounter (Signed)
Left voicemail to return call to office.

## 2023-12-01 ENCOUNTER — Other Ambulatory Visit: Payer: Self-pay | Admitting: Internal Medicine

## 2023-12-01 ENCOUNTER — Ambulatory Visit: Payer: Self-pay | Admitting: Internal Medicine

## 2023-12-01 DIAGNOSIS — J301 Allergic rhinitis due to pollen: Secondary | ICD-10-CM

## 2023-12-01 DIAGNOSIS — R0981 Nasal congestion: Secondary | ICD-10-CM

## 2023-12-01 MED ORDER — FLUTICASONE PROPIONATE 50 MCG/ACT NA SUSP: 2.0000 | Freq: Every day | NASAL | 0 refills | Status: DC

## 2023-12-01 MED ORDER — MONTELUKAST SODIUM 10 MG PO TABS: 10.0000 mg | ORAL_TABLET | Freq: Every day | ORAL | 0 refills | Status: DC

## 2023-12-01 MED ORDER — LEVOCETIRIZINE DIHYDROCHLORIDE 5 MG PO TABS: 5.0000 mg | ORAL_TABLET | Freq: Every evening | ORAL | 0 refills | Status: DC

## 2023-12-01 NOTE — Telephone Encounter (Signed)
 Copied from CRM [id number]. Topic: Clinical - Red Word Triage >> Dec 01, 2023 10:36 AM Merlynn LABOR wrote: Red Word that prompted transfer to Nurse Triage: Fall   Chief Complaint: Fall 4 days ago Symptoms: Hip and shoulder pain and dizziness that comes and goes Frequency: intermittent Pertinent Negatives: Patient denies light headed or dizzy before fall Disposition: [] ED /[] Urgent Care (no appt availability in office) / [x] Appointment(In office/virtual)/ []  [facility]/ [] Home Care/ [] Refused Recommended Disposition /[] [facility]/ []  Follow-up with PCP Additional Notes: Patient reports fall 4 days ago. States he was carrying a log when he turned around after someone called his name, states that he stumbled off the curb landing on his Right Hip and Shoulder. Pt endorses moderate body aches and mild dizziness, states that he uses a walking stick the past few days. Appt scheduled for 01/15  Reason for Disposition  MILD weakness (i.e., does not interfere with ability to work, go to school, normal activities)  (Exception: Mild weakness is a chronic symptom.)  Answer Assessment - Initial Assessment Questions 1. MECHANISM: How did the fall happen?     Was carrying a log and stepped off curb  2. DOMESTIC VIOLENCE AND ELDER ABUSE SCREENING: Did you fall because someone pushed you or tried to hurt you? If Yes, ask: Are you safe now?     No  3. ONSET: When did the fall happen? (e.g., minutes, hours, or days ago)     4 days ago  4. LOCATION: What part of the body hit the ground? (e.g., back, buttocks, head, hips, knees, hands, head, stomach)     Right hip, right shoulder, left hand, and face  5. INJURY: Did you hurt (injure) yourself when you fell? If Yes, ask: What did you injure? Tell me more about this? (e.g., body area; type of injury; pain severity)     All over  6. PAIN: Is there any pain? If Yes, ask: How bad is the pain? (e.g., Scale 1-10; or mild,   moderate, severe)   - NONE (0): No pain   - MILD (1-3): Doesn't interfere with normal activities    - MODERATE (4-7): Interferes with normal activities or awakens from sleep    - SEVERE (8-10): Excruciating pain, unable to do any normal activities      Moderate pain level  7. SIZE: For cuts, bruises, or swelling, ask: How large is it? (e.g., inches or centimeters)      Cut on left hand, bruise on right hip and arm  8. PREGNANCY: Is there any chance you are pregnant? When was your last menstrual period?     N/a  9. OTHER SYMPTOMS: Do you have any other symptoms? (e.g., dizziness, fever, weakness; new onset or worsening).      Dizziness and weakness  10. CAUSE: What do you think caused the fall (or falling)? (e.g., tripped, dizzy spell)       Tripped, stepped off curb  Protocols used: Falls and [facility]

## 2023-12-01 NOTE — Telephone Encounter (Signed)
 Copied from CRM [id number]. Topic: Clinical - Medication Refill >> Dec 01, 2023 10:31 AM Merlynn LABOR wrote: Most Recent Primary Care Visit:  Provider: JOSHUA DEBBY CROME  Department: [facility]  Visit Type: OFFICE VISIT  Date: 05/20/2023  Medication: montelukast  (SINGULAIR ) 10 MG tablet, levocetirizine (XYZAL ) 5 MG tablet, fluticasone  (FLONASE ) 50 MCG/ACT nasal spray   Has the patient contacted their pharmacy? No (Agent: If no, request that the patient contact the pharmacy for the refill. If patient does not wish to contact the pharmacy document the reason why and proceed with request.) (Agent: If yes, when and what did the pharmacy advise?)  Patient has not contacted pharmacy because bottles show no refills.   Is this the correct pharmacy for this prescription? Yes If no, delete pharmacy and type the correct one.  This is the patient's preferred pharmacy:  [facility] [phone] - [place], [place] - [address] AT [facility] [address] [place] [address] Phone: [phone] Fax: [phone]   Has the prescription been filled recently? No  Is the patient out of the medication? Yes  Has the patient been seen for an appointment in the last year OR does the patient have an upcoming appointment? Yes  Can we respond through MyChart? No  Agent: Please be advised that Rx refills may take up to 3 business days. We ask that you follow-up with your pharmacy.

## 2023-12-02 ENCOUNTER — Encounter: Payer: Self-pay | Admitting: Internal Medicine

## 2023-12-02 ENCOUNTER — Ambulatory Visit: Payer: Medicare Other | Admitting: Internal Medicine

## 2023-12-02 VITALS — BP 128/74 | HR 66 | Temp 98.0°F | Ht 69.0 in | Wt 159.0 lb

## 2023-12-02 DIAGNOSIS — S060X0A Concussion without loss of consciousness, initial encounter: Secondary | ICD-10-CM

## 2023-12-02 DIAGNOSIS — W19XXXA Unspecified fall, initial encounter: Secondary | ICD-10-CM | POA: Diagnosis not present

## 2023-12-02 DIAGNOSIS — S300XXA Contusion of lower back and pelvis, initial encounter: Secondary | ICD-10-CM

## 2023-12-02 DIAGNOSIS — M25511 Pain in right shoulder: Secondary | ICD-10-CM

## 2023-12-02 NOTE — Progress Notes (Signed)
 Subjective:    Patient ID: Charles Robles, male    DOB: [birth year], 78 y.o.   MRN: [id number]      HPI Charles Robles is here for  Chief Complaint  Patient presents with   Fall    Fall that happened Friday morning; Bruised right side hip and right inner arm, hit head also (slight headache); Patient reports he slipped off curb while carrying wood to a friend's car    [place] last Friday, 5 days ago - he was cutting and moving firewood and he stepped off the curb when he was called by his friend and fell and hit face on car, hit his right shoulder, right hip and left hand on the ground.  He has bruising in the right upper arm and right buttock/posterior leg.  He has a small abrasion on his left hand.  He got a massage on Sunday  - that helped.   He is still tired and sore.  He has had problems with his right gluteus tendinopathy x years.  It is worse than before.   He has had issues with the right shoulder in the past and it is worse now.    He feels a little foggy - ? Concussion.  No LOC.  Taking ibuprofen which is helping.     Medications and allergies reviewed with patient and updated if appropriate.  Current Outpatient Medications on File Prior to Visit  Medication Sig Dispense Refill   amoxicillin  (AMOXIL ) 500 MG tablet Take 4 tablets (2,000 mg total) by mouth as directed. Take 4 tablets 1 hour prior to dental work, including cleanings. 4 tablet 3   aspirin  EC 81 MG tablet Take 81 mg by mouth daily. Swallow whole.     esomeprazole  (NEXIUM ) 40 MG capsule Take 1 capsule (40 mg total) by mouth daily. 90 capsule 1   fish oil-omega-3 fatty acids  1000 MG capsule Take 1 g by mouth daily.     fluticasone  (FLONASE ) 50 MCG/ACT nasal spray Place 2 sprays into both nostrils daily. 48 g 0   levocetirizine (XYZAL ) 5 MG tablet Take 1 tablet (5 mg total) by mouth every evening. 90 tablet 0   montelukast  (SINGULAIR ) 10 MG tablet Take 1 tablet (10 mg total) by mouth at bedtime. 90  tablet 0   rosuvastatin  (CRESTOR ) 40 MG tablet Take 1 tablet (40 mg total) by mouth daily. 90 tablet 3   No current facility-administered medications on file prior to visit.    Review of Systems  Eyes:  Positive for visual disturbance (chronic - not related to fall).  Respiratory:  Negative for shortness of breath.   Cardiovascular:  Negative for chest pain.  Gastrointestinal:  Negative for nausea.  Neurological:  Positive for dizziness (occ - started after fall), light-headedness (occ) and headaches (slight, occ - started after the fall).       Objective:   Vitals:   12/02/23 1445  BP: 128/74  Pulse: 66  Temp: 98 F (36.7 C)  SpO2: 98%   BP Readings from Last 3 Encounters:  12/02/23 128/74  07/15/23 124/76  05/20/23 134/80   Wt Readings from Last 3 Encounters:  12/02/23 159 lb (72.1 kg)  07/15/23 157 lb 12.8 oz (71.6 kg)  05/20/23 161 lb (73 kg)   Body mass index is 23.48 kg/m.    Physical Exam Constitutional:      General: He is not in acute distress.    Appearance: Normal appearance. He is not ill-appearing.  HENT:  Head: Normocephalic and atraumatic.  Cardiovascular:     Rate and Rhythm: Normal rate and regular rhythm.  Pulmonary:     Effort: Pulmonary effort is normal.     Breath sounds: Normal breath sounds.  Abdominal:     General: There is no distension.     Palpations: Abdomen is soft.     Tenderness: There is no abdominal tenderness.  Musculoskeletal:        General: Tenderness (Tenderness anterior right shoulder where he hit-no bruising present, full range of motion, no obvious deformity) present. No deformity.  Skin:    Findings: Bruising (Bruise right upper arm-no tenderness, bruise right buttock and posterior upper leg that is tender and deep purple) present.  Neurological:     General: No focal deficit present.     Mental Status: He is alert and oriented to person, place, and time.     Cranial Nerves: No cranial nerve deficit.     Motor:  No weakness.  Psychiatric:        Mood and Affect: Mood normal.        Behavior: Behavior normal.            Assessment & Plan:    Fall: Recent fall He was carrying something heavy and his friend called him and he did not realize he stepped off a curb and hit his mouth on his car and landed on his right shoulder, right hip and left hand No LOC  Mild concussion: Hit his face on his car without loss of consciousness He is having some mild headaches, lightheadedness/dizziness and nausea He is also felt a little foggy Symptoms consistent with concussion He is taking ibuprofen which is helping with some of his pain and headaches Discussed symptoms and that they will likely improve over the next several days-if they worsen he needs to back off of activity and let us  know Symptoms are mild and I do not think he needs formal follow-up  Right shoulder pain: Acute on chronic States he has had some chronic shoulder issues in the past but with this recent fall is having acute pain in the shoulder-no deformity, swelling No need for imaging Symptoms should improve with symptomatic treatment He does have some bruising in the right upper arm likely related to hitting his shoulder-bruising is nontender and starting to improve  Ecchymosis right buttock region: Landed on his right posterior hip/buttock region and he does have a large bruise there It is tender-can apply heat Discussed that at this point his bruise should not get any larger and he will monitor I do not think we need to hold the baby aspirin , okay to take ibuprofen as needed  Hypertension: Chronic Blood pressure well-controlled here today Not currently on any medication  Follow-up with any questions or concerns

## 2023-12-02 NOTE — Patient Instructions (Addendum)
 Medications changes include :   ibuprofen    Use a heating pad for your bruised areas.      Return if symptoms worsen or fail to improve.     Concussion, Adult  A concussion is a brain injury from a hard, direct hit (trauma) to the head or body. This direct hit causes the brain to shake quickly back and forth inside the skull. This can damage brain cells and cause chemical changes in the brain. A concussion may also be known as a mild traumatic brain injury (TBI). The effects of a concussion can be serious. If you have a concussion, you should be very careful to avoid having a second concussion. What are the causes? This condition is caused by: A direct hit to your head. Sudden movement of your body that causes your brain to move back and forth inside the skull, such as in a car crash. What are the signs or symptoms? The signs of a concussion can be hard to notice. Early on, they may be missed by you, family members, and health care providers. You may look fine on the outside but may act or feel differently. Every head injury is different. Symptoms are usually temporary but may last for days, weeks, or even months. Some symptoms appear right away, but other symptoms may not show up for hours or days. Physical symptoms Headaches. Dizziness and problems with coordination or balance. Sensitivity to light or noise. Nausea or vomiting. Tiredness (fatigue). Vision or hearing problems. Seizure. Mental and emotional symptoms Irritability or mood changes. Memory problems. Trouble concentrating, organizing, or making decisions. Changes in eating or sleeping patterns. Slowness in thinking, acting or reacting, speaking, or reading. Anxiety or depression. How is this diagnosed? This condition is diagnosed based on your symptoms and injury. You may also have tests, including: Imaging tests, such as a CT scan or an MRI. Neuropsychological tests. These measure your thinking,  understanding, learning, and memory. How is this treated? Treatment for this condition includes: Stopping sports or activity if you are injured. Physical and mental rest and careful observation, usually at home. Medicines to help with symptoms such as headaches, nausea, or difficulty sleeping. Referral to a concussion clinic or rehab center. Follow these instructions at home: Activity Limit activities that require a lot of thought or concentration, such as: Doing homework or job-related work. Watching TV. Using the computer or phone. Playing memory games and doing puzzles. Rest helps your brain heal. Make sure you: Get plenty of sleep. Most adults should get 7-9 hours of sleep each night. Rest during the day. Take naps or rest breaks when you feel tired. Avoid high-intensity exercise or physical activities that take a lot of effort. Stop any activity that worsens symptoms. Your health care provider may recommend light exercise such as walking. Do not do high-risk activities that could cause a second concussion, such as riding a bike or playing sports. Ask your health care provider when you can return to your normal activities, such as school, work, sports, and driving. Your ability to react may be slower after a brain injury. Never do these activities if you are dizzy. General instructions  Take over-the-counter and prescription medicines only as told by your health care provider. Some medicines, such as blood thinners (anticoagulants) and aspirin , may increase the risk for complications, such as bleeding. Avoid taking opioid pain medicine while recovering from a concussion. Do not drink alcohol until your health care provider says you can. Drinking alcohol  may slow your recovery and can put you at risk of further injury. Watch your symptoms and tell others around you to do the same. Complications sometimes occur after a concussion. Tell your work Production designer, theatre/television/film, teachers, Tax adviser, school  counselor, coach, or sports trainer about your injury, symptoms, and restrictions. See a mental health therapist if you feel anxious or depressed. Managing this condition can be challenging. Keep all follow-up visits. Your health care provider will check on your recovery and give you a plan for returning to activities. How is this prevented? Avoiding another brain injury is very important. In rare cases, another injury can lead to permanent brain damage, brain swelling, or death. The risk of this is greatest during the first 7-10 days after a head injury. Avoid injuries by: Stopping activities that could lead to a second concussion, such as contact or recreational sports, until your health care provider says it is okay. Taking these actions once you have returned to sports or activities: Avoid plays or moves that can cause you to crash into another person. This is how most concussions occur. Follow the rules and be respectful of other players. Do not engage in violent or illegal plays. Getting regular exercise that includes strength and balance training. Wearing a properly fitting helmet during sports, biking, or other activities. Helmets can help protect you from serious skull and brain injuries, but they may not protect you from a concussion. Even when wearing a helmet, you should avoid being hit in the head. Where to find more information Centers for Disease Control and Prevention: TonerPromos.no Contact a health care provider if: Your symptoms do not improve or get worse. You have new symptoms. You have another injury. Your coordination gets worse. You have unusual behavior changes. Get help right away if: You have a severe or worsening headache. You have weakness or numbness in any part of your body, slurred speech, vision changes, or confusion. You vomit repeatedly. You lose consciousness, are sleepier than normal, or are difficult to wake up. You have a seizure. These symptoms may be an  emergency. Get help right away. Call 911. Do not wait to see if the symptoms will go away. Do not drive yourself to the hospital. Also, get help right away if: You have thoughts of hurting yourself or others. Take one of these steps if you feel like you may hurt yourself or others, or have thoughts about taking your own life: Go to your nearest emergency room. Call 911. Call the National Suicide Prevention Lifeline at 647-322-5495 or 988. This is open 24 hours a day. Text the Crisis Text Line at 785-423-4412. This information is not intended to replace advice given to you by your health care provider. Make sure you discuss any questions you have with your health care provider. Document Revised: 03/28/2022 Document Reviewed: 03/28/2022 Elsevier Patient Education  2024 [employer].

## 2024-02-27 ENCOUNTER — Other Ambulatory Visit: Payer: Self-pay | Admitting: Internal Medicine

## 2024-02-27 DIAGNOSIS — J301 Allergic rhinitis due to pollen: Secondary | ICD-10-CM

## 2024-03-01 ENCOUNTER — Other Ambulatory Visit: Payer: Self-pay | Admitting: Internal Medicine

## 2024-03-01 DIAGNOSIS — R0981 Nasal congestion: Secondary | ICD-10-CM

## 2024-03-08 DIAGNOSIS — H2513 Age-related nuclear cataract, bilateral: Secondary | ICD-10-CM | POA: Diagnosis not present

## 2024-03-08 DIAGNOSIS — H52203 Unspecified astigmatism, bilateral: Secondary | ICD-10-CM | POA: Diagnosis not present

## 2024-03-08 DIAGNOSIS — H18603 Keratoconus, unspecified, bilateral: Secondary | ICD-10-CM | POA: Diagnosis not present

## 2024-03-10 ENCOUNTER — Other Ambulatory Visit: Payer: Self-pay | Admitting: Internal Medicine

## 2024-03-10 DIAGNOSIS — J301 Allergic rhinitis due to pollen: Secondary | ICD-10-CM

## 2024-03-10 DIAGNOSIS — R0981 Nasal congestion: Secondary | ICD-10-CM

## 2024-03-10 MED ORDER — MONTELUKAST SODIUM 10 MG PO TABS: 10.0000 mg | ORAL_TABLET | Freq: Every day | ORAL | 0 refills | Status: DC

## 2024-03-10 MED ORDER — LEVOCETIRIZINE DIHYDROCHLORIDE 5 MG PO TABS: 5.0000 mg | ORAL_TABLET | Freq: Every evening | ORAL | 0 refills | Status: DC

## 2024-03-10 NOTE — Telephone Encounter (Signed)
 Copied from CRM [id number]. Topic: Clinical - Medication Refill >> Mar 10, 2024 11:02 AM [place] J wrote: Most Recent Primary Care Visit:  Provider: BURNS, Beckey Bourgeois  Department: [facility]  Visit Type: ACUTE  Date: 12/02/2023  Medication:  montelukast  (SINGULAIR ) 10 MG tablet levocetirizine (XYZAL ) 5 MG tablet  Has the patient contacted their pharmacy? Yes (Agent: If no, request that the patient contact the pharmacy for the refill. If patient does not wish to contact the pharmacy document the reason why and proceed with request.) (Agent: If yes, when and what did the pharmacy advise?) No more refills available. Is this the correct pharmacy for this prescription? Yes If no, delete pharmacy and type the correct one.  This is the patient's preferred pharmacy:   [facility] [phone] - [place], [place] - [address] AT [facility] [address] [place] [address] Phone: [phone] Fax: [phone]  Has the prescription been filled recently? No  Is the patient out of the medication? Yes  Has the patient been seen for an appointment in the last year OR does the patient have an upcoming appointment? Yes  Can we respond through MyChart? No  Agent: Please be advised that Rx refills may take up to 3 business days. We ask that you follow-up with your pharmacy.

## 2024-04-14 ENCOUNTER — Other Ambulatory Visit: Payer: Self-pay | Admitting: Internal Medicine

## 2024-04-14 DIAGNOSIS — J301 Allergic rhinitis due to pollen: Secondary | ICD-10-CM

## 2024-05-18 ENCOUNTER — Ambulatory Visit: Admitting: Internal Medicine

## 2024-05-18 ENCOUNTER — Encounter: Payer: Self-pay | Admitting: Internal Medicine

## 2024-05-18 VITALS — BP 154/88 | HR 64 | Temp 98.5°F | Resp 16 | Ht 69.0 in | Wt 154.0 lb

## 2024-05-18 DIAGNOSIS — I10 Essential (primary) hypertension: Secondary | ICD-10-CM | POA: Diagnosis not present

## 2024-05-18 DIAGNOSIS — D696 Thrombocytopenia, unspecified: Secondary | ICD-10-CM | POA: Diagnosis not present

## 2024-05-18 DIAGNOSIS — D510 Vitamin B12 deficiency anemia due to intrinsic factor deficiency: Secondary | ICD-10-CM | POA: Diagnosis not present

## 2024-05-18 DIAGNOSIS — L989 Disorder of the skin and subcutaneous tissue, unspecified: Secondary | ICD-10-CM | POA: Diagnosis not present

## 2024-05-18 DIAGNOSIS — I350 Nonrheumatic aortic (valve) stenosis: Secondary | ICD-10-CM | POA: Diagnosis not present

## 2024-05-18 DIAGNOSIS — J301 Allergic rhinitis due to pollen: Secondary | ICD-10-CM

## 2024-05-18 DIAGNOSIS — Z952 Presence of prosthetic heart valve: Secondary | ICD-10-CM

## 2024-05-18 LAB — CBC WITH DIFFERENTIAL/PLATELET
Basophils Absolute: 0 10*3/uL (ref 0.0–0.1)
Basophils Relative: 0.6 % (ref 0.0–3.0)
Eosinophils Absolute: 0.2 10*3/uL (ref 0.0–0.7)
Eosinophils Relative: 3.3 % (ref 0.0–5.0)
HCT: 40.7 % (ref 39.0–52.0)
Hemoglobin: 13.6 g/dL (ref 13.0–17.0)
Lymphocytes Relative: 23.5 % (ref 12.0–46.0)
Lymphs Abs: 1.3 10*3/uL (ref 0.7–4.0)
MCHC: 33.4 g/dL (ref 30.0–36.0)
MCV: 95.6 fl (ref 78.0–100.0)
Monocytes Absolute: 0.5 10*3/uL (ref 0.1–1.0)
Monocytes Relative: 10.2 % (ref 3.0–12.0)
Neutro Abs: 3.3 10*3/uL (ref 1.4–7.7)
Neutrophils Relative %: 62.4 % (ref 43.0–77.0)
Platelets: 129 10*3/uL — ABNORMAL LOW (ref 150.0–400.0)
RBC: 4.25 Mil/uL (ref 4.22–5.81)
RDW: 14.2 % (ref 11.5–15.5)
WBC: 5.4 10*3/uL (ref 4.0–10.5)

## 2024-05-18 LAB — BASIC METABOLIC PANEL WITH GFR
BUN: 18 mg/dL (ref 6–23)
CO2: 26 meq/L (ref 19–32)
Calcium: 9.1 mg/dL (ref 8.4–10.5)
Chloride: 105 meq/L (ref 96–112)
Creatinine, Ser: 1.06 mg/dL (ref 0.40–1.50)
GFR: 67 mL/min (ref >60.00)
Glucose, Bld: 99 mg/dL (ref 70–99)
Potassium: 4 meq/L (ref 3.5–5.1)
Sodium: 138 meq/L (ref 135–145)

## 2024-05-18 LAB — URINALYSIS, ROUTINE W REFLEX MICROSCOPIC
Bilirubin Urine: NEGATIVE
Hgb urine dipstick: NEGATIVE
Ketones, ur: NEGATIVE
Leukocytes,Ua: NEGATIVE
Nitrite: NEGATIVE
RBC / HPF: NONE SEEN (ref 0–?)
Specific Gravity, Urine: 1.01 (ref 1.000–1.030)
Total Protein, Urine: NEGATIVE
Urine Glucose: NEGATIVE
Urobilinogen, UA: 0.2 (ref 0.0–1.0)
WBC, UA: NONE SEEN (ref 0–?)
pH: 6 (ref 5.0–8.0)

## 2024-05-18 LAB — TSH: TSH: 0.92 u[IU]/mL (ref 0.35–5.50)

## 2024-05-18 LAB — HEPATIC FUNCTION PANEL
ALT: 42 U/L (ref 0–53)
AST: 35 U/L (ref 0–37)
Albumin: 4.3 g/dL (ref 3.5–5.2)
Alkaline Phosphatase: 45 U/L (ref 39–117)
Bilirubin, Direct: 0.2 mg/dL (ref 0.0–0.3)
Total Bilirubin: 0.6 mg/dL (ref 0.2–1.2)
Total Protein: 7 g/dL (ref 6.0–8.3)

## 2024-05-18 LAB — FOLATE: Folate: 15 ng/mL (ref >5.9)

## 2024-05-18 LAB — VITAMIN B12: Vitamin B-12: 353 pg/mL (ref 211–911)

## 2024-05-18 MED ORDER — FLUTICASONE PROPIONATE 50 MCG/ACT NA SUSP: 2.0000 | Freq: Every day | NASAL | 0 refills | Status: DC

## 2024-05-18 NOTE — Progress Notes (Signed)
 Subjective:  Patient ID: Charles Robles, male    DOB: August 31, 1946  Age: 78 y.o. MRN: [id number]  CC: Medication Management, Rash (Left lower leg. ), Loss of Consciousness (Patient states that he was walking home and patient states that he had 2 beers and  next thing he knew he was sitting on the sidewalk and he couldn't get up. He doesn't know how long he was unconscious for. He couldn't stand back up but somebody did help him up. ), and Hypertension   HPI Charles Robles presents for f/up ---  Discussed the use of AI scribe software for clinical note transcription with the patient, who gave verbal consent to proceed.  History of Present Illness   Charles Robles is a 78 year old male who presents with a recent episode of syncope.  Approximately three weeks ago, he experienced a syncopal episode while walking home from a ball game. It was dark, and he had consumed two beers. He does not recall if he had eaten prior to the event. He suddenly found himself sitting on the pavement across the street from [employer], having fallen and hit his knee. No warning signs such as pain, shortness of breath, dizziness, lightheadedness, or palpitations were present prior to the fall. He is unsure if he was overheated at the time.  He also reports a rash on his left lower leg that has been present for about two months. The rash is not itchy, painful, or burning, but he describes it as looking 'nasty'. He has been applying an over-the-counter psoriasis cream, which initially improved the rash, but the improvement has since ceased. He has never had a similar rash before.       Outpatient Medications Prior to Visit  Medication Sig Dispense Refill   amoxicillin  (AMOXIL ) 500 MG tablet Take 4 tablets (2,000 mg total) by mouth as directed. Take 4 tablets 1 hour prior to dental work, including cleanings. 4 tablet 3   aspirin  EC 81 MG tablet Take 81 mg by mouth daily.  Swallow whole.     ezetimibe  (ZETIA ) 10 MG tablet Take 10 mg by mouth daily.     rosuvastatin  (CRESTOR ) 40 MG tablet Take 1 tablet (40 mg total) by mouth daily. 90 tablet 3   sucralfate  (CARAFATE ) 1 g tablet Take 1 g by mouth 4 (four) times daily -  with meals and at bedtime.     esomeprazole  (NEXIUM ) 40 MG capsule Take 1 capsule (40 mg total) by mouth daily. 90 capsule 1   fish oil-omega-3 fatty acids  1000 MG capsule Take 1 g by mouth daily.     fluticasone  (FLONASE ) 50 MCG/ACT nasal spray Place 2 sprays into both nostrils daily. 48 g 0   levocetirizine (XYZAL ) 5 MG tablet Take 1 tablet (5 mg total) by mouth every evening. Schedule an appt for further refills 30 tablet 0   montelukast  (SINGULAIR ) 10 MG tablet Take 1 tablet (10 mg total) by mouth at bedtime. Schedule an appt for further refills 30 tablet 0   No facility-administered medications prior to visit.    ROS Review of Systems  Constitutional:  Negative for appetite change, chills, diaphoresis, fatigue and fever.  HENT: Negative.    Eyes: Negative.   Respiratory:  Negative for cough, chest tightness, shortness of breath and wheezing.   Cardiovascular:  Negative for chest pain, palpitations and leg swelling.  Gastrointestinal: Negative.  Negative for abdominal pain, constipation, diarrhea, nausea  and vomiting.  Endocrine: Negative.   Genitourinary: Negative.  Negative for difficulty urinating.  Musculoskeletal:  Negative for arthralgias and myalgias.  Skin:  Positive for color change and rash.  Neurological:  Positive for syncope. Negative for dizziness and light-headedness.  Hematological:  Negative for adenopathy. Bruises/bleeds easily.    Objective:  BP (!) 154/88 (BP Location: Left Arm, Patient Position: Sitting, Cuff Size: Normal)   Pulse 64   Temp 98.5 F (36.9 C) (Oral)   Resp 16   Ht 5' 9 (1.753 m)   Wt 154 lb (69.9 kg)   SpO2 97%   BMI 22.74 kg/m   BP Readings from Last 3 Encounters:  05/18/24 (!) 154/88   12/02/23 128/74  07/15/23 124/76    Wt Readings from Last 3 Encounters:  05/18/24 154 lb (69.9 kg)  12/02/23 159 lb (72.1 kg)  07/15/23 157 lb 12.8 oz (71.6 kg)    Physical Exam Vitals reviewed.  Constitutional:      Appearance: Normal appearance.  HENT:     Nose: Nose normal.     Mouth/Throat:     Mouth: Mucous membranes are moist.  Eyes:     General: No scleral icterus.    Conjunctiva/sclera: Conjunctivae normal.  Cardiovascular:     Rate and Rhythm: Regular rhythm. Bradycardia present.     Pulses: Normal pulses.     Heart sounds: No murmur heard.    No friction rub. No gallop.     Comments: 1/6 SEM RUSB 2/6 SEM LLSB  EKG-- SB, 57 bpm (new) No LVH, Q waves, or ST/T wave changes  Pulmonary:     Effort: Pulmonary effort is normal.     Breath sounds: No stridor. No wheezing, rhonchi or rales.  Abdominal:     General: Abdomen is flat.     Palpations: There is no mass.     Tenderness: There is no abdominal tenderness. There is no guarding.     Hernia: No hernia is present.  Musculoskeletal:     Cervical back: Neck supple.     Right lower leg: No edema.     Left lower leg: No edema.  Lymphadenopathy:     Cervical: No cervical adenopathy.  Skin:    General: Skin is warm and dry.     Coloration: Skin is not jaundiced or pale.     Findings: Bruising, lesion and rash present.  Neurological:     General: No focal deficit present.     Mental Status: He is alert. Mental status is at baseline.  Psychiatric:        Mood and Affect: Mood normal.        Behavior: Behavior normal.     Lab Results  Component Value Date   WBC 5.4 05/18/2024   HGB 13.6 05/18/2024   HCT 40.7 05/18/2024   PLT 129.0 (L) 05/18/2024   GLUCOSE 99 05/18/2024   CHOL 208 (H) 10/23/2023   TRIG 91 10/23/2023   HDL 105 10/23/2023   LDLDIRECT 96.0 08/12/2022   LDLCALC 87 10/23/2023   ALT 42 05/18/2024   AST 35 05/18/2024   NA 138 05/18/2024   K 4.0 05/18/2024   CL 105 05/18/2024    CREATININE 1.06 05/18/2024   BUN 18 05/18/2024   CO2 26 05/18/2024   TSH 0.92 05/18/2024   PSA 0.96 09/17/2021   INR 1.0 07/08/2019   HGBA1C 5.6 11/11/2022    CT Chest Wo Contrast Result Date: 10/04/2021 CLINICAL DATA:  History of pulmonary nodule, melanoma, aortic  valve replacement EXAM: CT CHEST WITHOUT CONTRAST TECHNIQUE: Multidetector CT imaging of the chest was performed following the standard protocol without IV contrast. COMPARISON:  07/07/2020 FINDINGS: Cardiovascular: Limited without contrast. Thoracic aortic atherosclerosis and native coronary atherosclerosis scattered calcifications. Thoracic aorta normal in caliber. Negative for aneurysm. No mediastinal hemorrhage or hematoma. Previous TAVR noted. Normal heart size. Mediastinum/Nodes: No enlarged mediastinal or axillary lymph nodes. Thyroid  gland, trachea, and esophagus demonstrate no significant findings. Lungs/Pleura: Stable bibasilar subpleural scarring. Stable 3 mm calcified granuloma in the right lung base. No acute airspace process, collapse or consolidation. No new or enlarging pulmonary nodule. Trachea and central airways are calcified but patent. No pleural abnormality, effusion, or pneumothorax. Upper Abdomen: Small hiatal hernia. Abdominal aortic atherosclerosis. No acute upper abdominal finding. Splenic calcified granuloma. Musculoskeletal: Degenerative changes of the spine. No acute osseous finding IMPRESSION: Stable bibasilar subpleural scarring, mild. Stable 3 mm calcified granuloma right lower lobe. Remote TAVR. Native thoracic aortic and coronary atherosclerosis. No suspicious or enlarging pulmonary nodule. Aortic Atherosclerosis (ICD10-I70.0). Electronically Signed   By: CHRISTELLA.  Shick M.D.   On: 10/04/2021 09:24    Assessment & Plan:  Vitamin B12 deficiency anemia due to intrinsic factor deficiency -     CBC with Differential/Platelet; Future -     Folate; Future -     Methylmalonic acid, serum; Future  Seasonal allergic  rhinitis due to pollen -     Fluticasone  Propionate; Place 2 sprays into both nostrils daily.  Dispense: 48 g; Refill: 0  Primary hypertension- BP is adequately well controlled. EKG is negative for LVH. -     EKG 12-Lead -     Basic metabolic panel with GFR; Future -     Vitamin B12; Future -     TSH; Future -     Urinalysis, Routine w reflex microscopic; Future -     Hepatic function panel; Future  S/P TAVR (transcatheter aortic valve replacement) -     Ambulatory referral to Cardiology  Severe aortic stenosis -     Ambulatory referral to Cardiology  Skin lesion of right arm -     Ambulatory referral to Dermatology  Thrombocytopenia (HCC)- No bleeding.     Follow-up: Return in about 3 months (around 08/18/2024).  Debby Molt, MD

## 2024-05-18 NOTE — Patient Instructions (Signed)
Syncope, Adult  Syncope refers to a condition in which a person temporarily loses consciousness. Syncope may also be called fainting or passing out. It is caused by a sudden decrease in blood flow to the brain. This can happen for a variety of reasons. Most causes of syncope are not dangerous. It can be triggered by things such as needle sticks, seeing blood, pain, or intense emotion. However, syncope can also be a sign of a serious medical problem, such as a heart abnormality. Other causes can include dehydration, migraines, or taking medicines that lower blood pressure. Your health care provider may do tests to find the reason why you are having syncope. If you faint, get medical help right away. Call your local emergency services (911 in the [place] Follow these instructions at home: Pay attention to any changes in your symptoms. Take these actions to stay safe and to help relieve your symptoms: Knowing when you may be about to faint Signs that you may be about to faint include: Feeling dizzy, weak, light-headed, or like the room is spinning. Feeling nauseous. Seeing spots or seeing all white or all black in your field of vision. Having cold, clammy skin or feeling warm and sweaty. Hearing ringing in the ears (tinnitus). If you start to feel like you might faint, sit or lie down right away. If sitting, put your head down between your legs. If lying down, raise (elevate) your feet above the level of your heart. Breathe deeply and steadily. Wait until all the symptoms have passed. Have someone stay with you until you feel stable. Medicines Take over-the-counter and prescription medicines only as told by your health care provider. If you are taking blood pressure or heart medicine, get up slowly and take several minutes to sit and then stand. This can reduce dizziness and decrease the risk of syncope. Lifestyle Do not drive, use machinery, or play sports until your health care provider says it is  okay. Do not drink alcohol. Do not use any products that contain nicotine or tobacco. These products include cigarettes, chewing tobacco, and vaping devices, such as e-cigarettes. If you need help quitting, ask your health care provider. Avoid hot tubs and saunas. General instructions Talk with your health care provider about your symptoms. You may need to have testing to understand the cause of your syncope. Drink enough fluid to keep your urine pale yellow. Avoid prolonged standing. If you must stand for a long time, do movements such as: Moving your legs. Crossing your legs. Flexing and stretching your leg muscles. Squatting. Keep all follow-up visits. This is important. Contact a health care provider if: You have episodes of near fainting. Get help right away if: You faint. You hit your head or are injured after fainting. You have any of these symptoms that may indicate trouble with your heart: Fast or irregular heartbeats (palpitations). Unusual pain in your chest, abdomen, or back. Shortness of breath. You have a seizure. You have a severe headache. You are confused. You have vision problems. You have severe weakness or trouble walking. You are bleeding from your mouth or rectum, or you have black or tarry stool. These symptoms may represent a serious problem that is an emergency. Do not wait to see if your symptoms will go away. Get medical help right away. Call your local emergency services (911 in the [place] Do not drive yourself to the hospital. Summary Syncope refers to a condition in which a person temporarily loses consciousness. Syncope may also be called   fainting or passing out. It is caused by a sudden decrease in blood flow to the brain. Signs that you may be about to faint include dizziness, feeling light-headed, feeling nauseous, sudden vision changes, or cold, clammy skin. Even though most causes of syncope are not dangerous, syncope can be a sign of a serious  medical problem. Get help right away if you faint. If you start to feel like you might faint, sit or lie down right away. If sitting, put your head down between your legs. If lying down, raise (elevate) your feet above the level of your heart. This information is not intended to replace advice given to you by your health care provider. Make sure you discuss any questions you have with your health care provider. Document Revised: 03/14/2021 Document Reviewed: 03/14/2021 Elsevier Patient Education  2024 [employer]  

## 2024-05-20 LAB — METHYLMALONIC ACID, SERUM: Methylmalonic Acid, Quant: 242 nmol/L (ref 69–390)

## 2024-05-22 ENCOUNTER — Ambulatory Visit: Payer: Self-pay | Admitting: Internal Medicine

## 2024-06-01 ENCOUNTER — Ambulatory Visit: Admitting: Physician Assistant

## 2024-06-01 ENCOUNTER — Encounter: Payer: Self-pay | Admitting: Physician Assistant

## 2024-06-01 VITALS — BP 158/99

## 2024-06-01 DIAGNOSIS — C4492 Squamous cell carcinoma of skin, unspecified: Secondary | ICD-10-CM

## 2024-06-01 DIAGNOSIS — L578 Other skin changes due to chronic exposure to nonionizing radiation: Secondary | ICD-10-CM | POA: Diagnosis not present

## 2024-06-01 DIAGNOSIS — D492 Neoplasm of unspecified behavior of bone, soft tissue, and skin: Secondary | ICD-10-CM | POA: Diagnosis not present

## 2024-06-01 DIAGNOSIS — L814 Other melanin hyperpigmentation: Secondary | ICD-10-CM

## 2024-06-01 DIAGNOSIS — Z1283 Encounter for screening for malignant neoplasm of skin: Secondary | ICD-10-CM

## 2024-06-01 DIAGNOSIS — D1801 Hemangioma of skin and subcutaneous tissue: Secondary | ICD-10-CM

## 2024-06-01 DIAGNOSIS — L309 Dermatitis, unspecified: Secondary | ICD-10-CM | POA: Diagnosis not present

## 2024-06-01 DIAGNOSIS — Z85820 Personal history of malignant melanoma of skin: Secondary | ICD-10-CM

## 2024-06-01 DIAGNOSIS — W908XXA Exposure to other nonionizing radiation, initial encounter: Secondary | ICD-10-CM

## 2024-06-01 DIAGNOSIS — L821 Other seborrheic keratosis: Secondary | ICD-10-CM | POA: Diagnosis not present

## 2024-06-01 DIAGNOSIS — C44622 Squamous cell carcinoma of skin of right upper limb, including shoulder: Secondary | ICD-10-CM

## 2024-06-01 DIAGNOSIS — D229 Melanocytic nevi, unspecified: Secondary | ICD-10-CM

## 2024-06-01 DIAGNOSIS — D485 Neoplasm of uncertain behavior of skin: Secondary | ICD-10-CM

## 2024-06-01 HISTORY — DX: Squamous cell carcinoma of skin, unspecified: C44.92

## 2024-06-01 MED ORDER — TRIAMCINOLONE ACETONIDE 0.1 % EX OINT: 1.0000 | TOPICAL_OINTMENT | Freq: Two times a day (BID) | CUTANEOUS | 0 refills | Status: DC

## 2024-06-01 NOTE — Progress Notes (Signed)
 New Patient Visit   Subjective  Charles Robles is a 78 y.o. male who presents for the following: Skin Cancer Screening and Upper Body Skin Exam - History of Melanoma. He has a spot of right arm x several months - no symptoms just there. He also has a spot of his left lower leg that has no symptoms and has been the for quite a while.   The patient presents for Upper Body Skin Exam (UBSE) for skin cancer screening and mole check. The patient has spots, moles and lesions to be evaluated, some may be new or changing and the patient may have concern these could be cancer.    The following portions of the chart were reviewed this encounter and updated as appropriate: medications, allergies, medical history  Review of Systems:  No other skin or systemic complaints except as noted in HPI or Assessment and Plan.  Objective  Well appearing patient in no apparent distress; mood and affect are within normal limits.  All skin waist up examined. Relevant physical exam findings are noted in the Assessment and Plan.    Right proximal forearm 1.2 cm scaly erythematous plaque   Assessment & Plan   NEOPLASM OF UNCERTAIN BEHAVIOR OF SKIN Right proximal forearm Skin / nail biopsy Type of biopsy: tangential   Informed consent: discussed and consent obtained   Timeout: patient name, date of birth, surgical site, and procedure verified   Procedure prep:  Patient was prepped and draped in usual sterile fashion Prep type:  Isopropyl alcohol Anesthesia: the lesion was anesthetized in a standard fashion   Anesthetic:  1% lidocaine  w/ epinephrine 1-100,000 buffered w/ 8.4% NaHCO3 Instrument used: flexible razor blade   Hemostasis achieved with: pressure, aluminum chloride and electrodesiccation   Outcome: patient tolerated procedure well   Post-procedure details: sterile dressing applied and wound care instructions given   Dressing type: bandage and petrolatum    Specimen 1 - Surgical  pathology Differential Diagnosis: SCC vs other   Check Margins: No SCREENING EXAM FOR SKIN CANCER   HISTORY OF MELANOMA   ACTINIC SKIN DAMAGE   LENTIGINES   SEBORRHEIC KERATOSIS   CHERRY ANGIOMA   MULTIPLE BENIGN NEVI   DERMATITIS, UNSPECIFIED   Skin cancer screening performed today.  Actinic Damage - Chronic condition, secondary to cumulative UV/sun exposure - diffuse scaly erythematous macules with underlying dyspigmentation - Recommend daily broad spectrum sunscreen SPF 30+ to sun-exposed areas, reapply every 2 hours as needed.  - Staying in the shade or wearing long sleeves, sun glasses (UVA+UVB protection) and wide brim hats (4-inch brim around the entire circumference of the hat) are also recommended for sun protection.  - Call for new or changing lesions.  Lentigines, Seborrheic Keratoses, Hemangiomas - Benign normal skin lesions - Benign-appearing - Call for any changes  Melanocytic Nevi - Tan-brown and/or pink-flesh-colored symmetric macules and papules - Benign appearing on exam today - Observation - Call clinic for new or changing moles - Recommend daily use of broad spectrum spf 30+ sunscreen to sun-exposed areas.   HISTORY OF MELANOMA OF MID BACK - No evidence of recurrence today - No lymphadenopathy - Recommend regular full body skin exams - Recommend daily broad spectrum sunscreen SPF 30+ to sun-exposed areas, reapply every 2 hours as needed.  - Call if any new or changing lesions are noted between office visits   DERMATITIS UNSPECIFIED - left lower leg (see photo)   Exam: Scaly pink papules coalescing to plaques of left lat ankle  Treatment Plan: TMC 0.1% ointment Apply to affected area twice daily until clear.   Return in about 1 month (around 07/02/2024) for Follow up Dermatitis.  I, Roseline Hutchinson, CMA, am acting as scribe for Oseas Detty K, PA-C .   Documentation: I have reviewed the above documentation for accuracy and  completeness, and I agree with the above.  Larrisa Cravey K, PA-C

## 2024-06-01 NOTE — Patient Instructions (Signed)

## 2024-06-02 LAB — SURGICAL PATHOLOGY

## 2024-06-05 ENCOUNTER — Ambulatory Visit: Payer: Self-pay | Admitting: Physician Assistant

## 2024-06-08 NOTE — Telephone Encounter (Signed)
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

## 2024-06-08 NOTE — Telephone Encounter (Signed)
 Left message (per patient) that his biopsy was +SCC and he would need Mohs surgery. Advised him to call to discuss the details and schedule the appointment with Dr Paci/hd

## 2024-06-16 ENCOUNTER — Telehealth: Payer: Self-pay

## 2024-06-16 NOTE — Telephone Encounter (Signed)
 Copied from CRM [id number]. Topic: Clinical - Medical Advice >> Jun 16, 2024  3:06 PM Thersia BROCKS wrote: Reason for CRM: Patient called in regarding his labs, stated he received the lab work via mail, patient wanted to know what does he need to do to get his platelet count up. Would like for a nurse to give him a callback regarding this

## 2024-06-16 NOTE — Telephone Encounter (Signed)
 Unable to reach patient. LMTRC

## 2024-06-16 NOTE — Telephone Encounter (Signed)
 Spoke with the patients. Went over his labs and he gave a verbal understanding. Also scheduled him a follow up appointment.

## 2024-07-06 ENCOUNTER — Ambulatory Visit: Admitting: Physician Assistant

## 2024-07-06 ENCOUNTER — Encounter: Payer: Self-pay | Admitting: Physician Assistant

## 2024-07-06 DIAGNOSIS — L309 Dermatitis, unspecified: Secondary | ICD-10-CM | POA: Diagnosis not present

## 2024-07-06 DIAGNOSIS — C4492 Squamous cell carcinoma of skin, unspecified: Secondary | ICD-10-CM

## 2024-07-06 DIAGNOSIS — C44622 Squamous cell carcinoma of skin of right upper limb, including shoulder: Secondary | ICD-10-CM | POA: Diagnosis not present

## 2024-07-06 NOTE — Progress Notes (Signed)
   Follow-Up Visit   Subjective  Charles Robles is a 78 y.o. male who presents for the FOLLOW UP of rash. Pt states the places on his ankle are still there and he does not believe the treatment is working we prescribed triamcinolone . He has used the medication maybe once a day.   Patient underwent biopsy at last visit and pathology revealed SCC. He has already called to have this to be surgically excised.    The following portions of the chart were reviewed this encounter and updated as appropriate: medications, allergies, medical history  Review of Systems:  No other skin or systemic complaints except as noted in HPI or Assessment and Plan.  Objective  Well appearing patient in no apparent distress; mood and affect are within normal limits.  A focused examination was performed of the following areas:  Left leg, arms, scalp and buttocks  Relevant exam findings are noted in the Assessment and Plan.    Assessment & Plan   DERMATITIS UNSPECIFIED -  ANKLE -  improving   -Continue triamcinolone  0.1% cream twice a day until cleared on affected area.     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 Corey.   SQUAMOUS CELL CARCINOMA OF SKIN   DERMATITIS, UNSPECIFIED    Return if symptoms worsen or fail to improve.  I, Doyce Pan, CMA, am acting as scribe for Charles Penning K, PA-C.   Documentation: I have reviewed the above documentation for accuracy and completeness, and I agree with the above.  Charles Kolodziejski K, PA-C

## 2024-07-06 NOTE — Patient Instructions (Signed)

## 2024-07-19 ENCOUNTER — Encounter: Payer: Self-pay | Admitting: Dermatology

## 2024-07-25 ENCOUNTER — Ambulatory Visit (INDEPENDENT_AMBULATORY_CARE_PROVIDER_SITE_OTHER): Admitting: Dermatology

## 2024-07-25 ENCOUNTER — Encounter: Payer: Self-pay | Admitting: Dermatology

## 2024-07-25 VITALS — BP 142/86 | Temp 97.7°F

## 2024-07-25 DIAGNOSIS — L579 Skin changes due to chronic exposure to nonionizing radiation, unspecified: Secondary | ICD-10-CM | POA: Diagnosis not present

## 2024-07-25 DIAGNOSIS — C44622 Squamous cell carcinoma of skin of right upper limb, including shoulder: Secondary | ICD-10-CM

## 2024-07-25 DIAGNOSIS — C4492 Squamous cell carcinoma of skin, unspecified: Secondary | ICD-10-CM

## 2024-07-25 DIAGNOSIS — L814 Other melanin hyperpigmentation: Secondary | ICD-10-CM

## 2024-07-25 NOTE — Progress Notes (Signed)
 Follow-Up Visit   Subjective  Charles Robles is a 78 y.o. 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  The following portions of the chart were reviewed this encounter and updated as appropriate: medications, allergies, medical history  Review of Systems:  No other skin or systemic complaints except as noted in HPI or Assessment and Plan.  Objective  Well appearing patient in no apparent distress; mood and affect are within normal limits.  A focused examination was performed of the following areas: Right proximal forearm Relevant physical exam findings are noted in the Assessment and Plan.   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 Yes   Was the anticoagulation regimen changed prior to Mohs? No    Anesthesia: Anesthesia method: local infiltration Local anesthetic: lidocaine  1% WITH epi  Procedure Details: Timeout: pre-procedure verification complete Procedure Prep: patient was prepped and draped in usual sterile fashion Prep type: chlorhexidine  Biopsy accession number: [id number] Biopsy lab: [facility] Date of biopsy: 06/01/2024 Frozen section biopsy performed: No   Specimen debulked: No   Pre-Op diagnosis: squamous cell carcinoma SCC subtype: KA type Surgery side: right Surgical site (from skin exam): right proximal forearm Pre-operative length (cm): 1.1 Pre-operative width (cm): 0.7 Indications for Mohs surgery: anatomic location where tissue conservation is critical Previously treated? No    Micrographic Surgery Details: Post-operative length (cm): 1.5 Post-operative width (cm): 1.7 Number of Mohs  stages: 1 Cumulative additional sections past 5 per stage: 0 Post surgery depth of defect: subcutaneous fat  Stage 1    Tumor features identified on Mohs section: no tumor identified    Depth of tumor invasion after stage: subcutaneous fat  Patient tolerance of procedure: tolerated well, no immediate complications  Reconstruction: Was the defect reconstructed? Yes   Was reconstruction performed by the same Mohs surgeon? Yes   Setting of reconstruction: outpatient office When was reconstruction performed? same day Type of reconstruction: linear Linear reconstruction: complex Length of linear repair (cm): 5  Opioids: Did the patient receive a prescription for opioid/narcotic related to Mohs surgery?: No    Antibiotics: Does patient meet AHA guidelines for endocarditis?: No   Does patient meet AHA guidelines for orthopedic prophylaxis?: No   Were antibiotics given on the day of surgery?: No   Did surgery breach mucosa, expose cartilage/bone, involve an area of lymphedema/inflamed/infected tissue? No    Skin repair Complexity:  Complex Final length (cm):  4.5 Informed consent: discussed and consent obtained   Timeout: patient name, date of birth, surgical site, and procedure verified   Procedure prep:  Patient was prepped and draped in usual sterile fashion Prep type:  Chlorhexidine  Anesthesia: the lesion was anesthetized in a standard fashion   Anesthetic:  1% lidocaine  w/ epinephrine 1-100,000 buffered w/ 8.4% NaHCO3 Reason for type of repair: reduce the risk of dehiscence, infection, and necrosis, allow closure of the large defect and preserve normal anatomy   Undermining: area extensively undermined   Subcutaneous layers (deep stitches):  Suture size:  4-0 Suture type: Vicryl (polyglactin 910)   Stitches:  Buried vertical mattress Fine/surface layer approximation (top stitches):  Suture type: cyanoacrylate tissue glue   Hemostasis achieved with: suture, pressure and  electrodesiccation Outcome: patient  tolerated procedure well with no complications   Post-procedure details: sterile dressing applied and wound care instructions given   Dressing type: pressure dressing (Steri Strips)      Return if symptoms worsen or fail to improve.  LILLETTE Rollene Gobble, RN, am acting as scribe for RUFUS CHRISTELLA HOLY, MD .   07/25/2024  HISTORY OF PRESENT ILLNESS  Charles Robles is seen in consultation at the request of Erminio Like, PA-C for biopsy-proven Well Differentiated Squamous Cell Carcinoma on the right proximal forearm. They note that the area has been present for about 6 months increasing in size with time.  There is no history of previous treatment.  Reports no other new or changing lesions and has no other complaints today.  Medications and allergies: see patient chart.  Review of systems: Reviewed 8 systems and notable for the above skin cancer.  All other systems reviewed are unremarkable/negative, unless noted in the HPI. Past medical history, surgical history, family history, social history were also reviewed and are noted in the chart/questionnaire.    PHYSICAL EXAMINATION  General: Well-appearing, in no acute distress, alert and oriented x 4. Vitals reviewed in chart (if available).   Skin: Exam reveals a 1.1 x 0.7 cm erythematous papule and biopsy scar on the right proximal forearm. There are rhytids, telangiectasias, and lentigines, consistent with photodamage.  Biopsy report(s) reviewed, confirming the diagnosis.   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 Due to location, size, histology, or recurrence and the likelihood of subclinical extension as well as the need to conserve normal surrounding tissue, the patient was deemed acceptable for Mohs micrographic surgery (MMS).  The nature and purpose of the procedure, associated benefits and  risks including recurrence and scarring, possible complications such as pain, infection, and bleeding, and alternative methods of treatment if appropriate were discussed with the patient during consent. The lesion location was verified by the patient, by reviewing previous notes, pathology reports, and by photographs as well as angulation measurements if available.  Informed consent was reviewed and signed by the patient, and timeout was performed at 10:15 AM. See op note below.  2. For the photodamage and solar lentigines, sun protection discussed/information given on OTC sunscreens, and we recommend continued regular follow-up with primary dermatologist every 6 months or sooner for any growing, bleeding, or changing lesions. 3. Prognosis and future surveillance discussed. 4. Letter with treatment outcome sent to referring provider. 5. Pain acetaminophen /ibuprofen  MOHS MICROGRAPHIC SURGERY AND RECONSTRUCTION  Initial size:   1.1 x 0.7 cm Surgical defect/wound size: 1.5 x 1.7 cm Anesthesia:    0.33% lidocaine  with 1:200,000 epinephrine EBL:    <5 mL Complications:  None Repair type:   Complex SQ suture:   4-0 Vicryl Cutaneous suture:  Cyanoacrylate and Steristrips Final size of the repair: 4.5 cm  Stages: 1  STAGE I: Anesthesia achieved with 0.5% lidocaine  with 1:200,000 epinephrine. ChloraPrep applied. 1 section(s) excised using Mohs technique (this includes total peripheral and deep tissue margin excision and evaluation with frozen sections, excised and interpreted by the same physician). The tumor was first debulked and then excised with an approx. 2mm margin.  Hemostasis was achieved with electrocautery as needed.  The specimen was then oriented, subdivided/relaxed, inked, and processed using Mohs technique.    Frozen section analysis revealed a clear deep and peripheral margin.  Reconstruction  The surgical wound was then cleaned, prepped, and  re-anesthetized as above. Wound edges  were undermined extensively along at least one entire edge and at a distance equal to or greater than the width of the defect (see wound defect size above) in order to achieve closure and decrease wound tension and anatomic distortion. Redundant tissue repair including standing cone removal was performed. Hemostasis was achieved with electrocautery. Subcutaneous and epidermal tissues were approximated with the above sutures. The surgical site was then lightly scrubbed with sterile, saline-soaked gauze. The area was then bandaged using Vaseline ointment, non-adherent gauze, gauze pads, and tape to provide an adequate pressure dressing. The patient tolerated the procedure well, was given detailed written and verbal wound care instructions, and was discharged in good condition.   The patient will follow-up: 4 weeks.     Documentation: I have reviewed the above documentation for accuracy and completeness, and I agree with the above.  RUFUS CHRISTELLA HOLY, MD

## 2024-07-25 NOTE — Patient Instructions (Signed)

## 2024-08-16 ENCOUNTER — Other Ambulatory Visit: Payer: Self-pay | Admitting: Internal Medicine

## 2024-08-16 DIAGNOSIS — J301 Allergic rhinitis due to pollen: Secondary | ICD-10-CM

## 2024-08-17 ENCOUNTER — Other Ambulatory Visit: Payer: Self-pay | Admitting: Physician Assistant

## 2024-08-17 ENCOUNTER — Encounter: Payer: Self-pay | Admitting: Physician Assistant

## 2024-08-17 ENCOUNTER — Ambulatory Visit: Admitting: Physician Assistant

## 2024-08-17 VITALS — BP 169/80 | HR 65

## 2024-08-17 DIAGNOSIS — D489 Neoplasm of uncertain behavior, unspecified: Secondary | ICD-10-CM

## 2024-08-17 DIAGNOSIS — W908XXA Exposure to other nonionizing radiation, initial encounter: Secondary | ICD-10-CM | POA: Diagnosis not present

## 2024-08-17 DIAGNOSIS — L821 Other seborrheic keratosis: Secondary | ICD-10-CM

## 2024-08-17 DIAGNOSIS — C4492 Squamous cell carcinoma of skin, unspecified: Secondary | ICD-10-CM | POA: Insufficient documentation

## 2024-08-17 DIAGNOSIS — D1801 Hemangioma of skin and subcutaneous tissue: Secondary | ICD-10-CM

## 2024-08-17 DIAGNOSIS — L570 Actinic keratosis: Secondary | ICD-10-CM | POA: Diagnosis not present

## 2024-08-17 DIAGNOSIS — L578 Other skin changes due to chronic exposure to nonionizing radiation: Secondary | ICD-10-CM

## 2024-08-17 DIAGNOSIS — C44311 Basal cell carcinoma of skin of nose: Secondary | ICD-10-CM

## 2024-08-17 DIAGNOSIS — D229 Melanocytic nevi, unspecified: Secondary | ICD-10-CM

## 2024-08-17 DIAGNOSIS — Z85828 Personal history of other malignant neoplasm of skin: Secondary | ICD-10-CM

## 2024-08-17 DIAGNOSIS — Z85820 Personal history of malignant melanoma of skin: Secondary | ICD-10-CM

## 2024-08-17 DIAGNOSIS — Z1283 Encounter for screening for malignant neoplasm of skin: Secondary | ICD-10-CM | POA: Diagnosis not present

## 2024-08-17 DIAGNOSIS — L309 Dermatitis, unspecified: Secondary | ICD-10-CM | POA: Diagnosis not present

## 2024-08-17 DIAGNOSIS — L814 Other melanin hyperpigmentation: Secondary | ICD-10-CM | POA: Diagnosis not present

## 2024-08-17 MED ORDER — HYDROCORTISONE 2.5 % EX CREA: TOPICAL_CREAM | Freq: Two times a day (BID) | CUTANEOUS | 11 refills | Status: AC | PRN

## 2024-08-17 MED ORDER — TRIAMCINOLONE ACETONIDE 0.1 % EX OINT: 1.0000 | TOPICAL_OINTMENT | Freq: Two times a day (BID) | CUTANEOUS | 2 refills | Status: DC

## 2024-08-17 MED ORDER — TRIAMCINOLONE ACETONIDE 0.1 % EX OINT: 1.0000 | TOPICAL_OINTMENT | Freq: Two times a day (BID) | CUTANEOUS | 2 refills | Status: AC

## 2024-08-17 NOTE — Progress Notes (Signed)
 Total Body Skin Exam (TBSE) Visit   Subjective  Charles Robles is a 78 y.o. male ESTABLISHED PATIENT who presents for the following: Skin Cancer Screening and Full Body Skin Exam  Other concern: new itchy rash now x 2 weeks. Has started a new soap.   Has history of melanoma, SCC as well as BCC.   The following portions of the chart were reviewed this encounter and updated as appropriate: medications, allergies, medical history  Review of Systems:  No other skin or systemic complaints except as noted in HPI or Assessment and Plan.  Objective  Well appearing patient in no apparent distress; mood and affect are within normal limits.  A full examination was performed including scalp, head, eyes, ears, nose, lips, neck, chest, axillae, abdomen, back, buttocks, bilateral upper extremities, bilateral lower extremities, hands, feet, fingers, toes, fingernails, and toenails. All findings within normal limits unless otherwise noted below.   Relevant physical exam findings are noted in the Assessment and Plan.  HISTORY OF NONMELANOMA SKIN CANCER  - No evidence of recurrence today - No lymphadenopathy - Recommend regular full body skin exams - Recommend daily broad spectrum sunscreen SPF 30+ to sun-exposed areas, reapply every 2 hours as needed.  - Call if any new or changing lesions are noted between office visits   HISTORY OF MELANOMA - No evidence of recurrence today - No lymphadenopathy - Recommend regular full body skin exams - Recommend daily broad spectrum sunscreen SPF 30+ to sun-exposed areas, reapply every 2 hours as needed.  - Call if any new or changing lesions are noted between office visits   Right Nasal bridge 0.4 pink pearly papule   Assessment & Plan   LENTIGINES, SEBORRHEIC KERATOSES, HEMANGIOMAS - Benign normal skin lesions - Benign-appearing - Call for any changes  MELANOCYTIC NEVI - Tan-brown and/or pink-flesh-colored symmetric macules and  papules - Benign appearing on exam today - Observation - Call clinic for new or changing moles - Recommend daily use of broad spectrum spf 30+ sunscreen to sun-exposed areas.   ACTINIC DAMAGE - Chronic condition, secondary to cumulative UV/sun exposure - diffuse scaly erythematous macules with underlying dyspigmentation - Recommend daily broad spectrum sunscreen SPF 30+ to sun-exposed areas, reapply every 2 hours as needed.  - Staying in the shade or wearing long sleeves, sun glasses (UVA+UVB protection) and wide brim hats (4-inch brim around the entire circumference of the hat) are also recommended for sun protection.  - Call for new or changing lesions.  SKIN CANCER SCREENING PERFORMED TODAY.  DERMATITIS UNSPECIFIED - LIKELY ALLERGIC CONTACT DERMATITIS  - change soap to Vanicream  - apply topical steroids as directed - 2 new RX sent in.     DERMATITIS, UNSPECIFIED   Related Medications triamcinolone  ointment (KENALOG ) 0.1 % Apply 1 Application topically 2 (two) times daily. Apply to affected area of left lower leg twice daily until clear. hydrocortisone 2.5 % cream Apply topically 2 (two) times daily as needed (Rash). NEOPLASM OF UNCERTAIN BEHAVIOR Right Nasal bridge Skin / nail biopsy Type of biopsy: tangential   Informed consent: discussed and consent obtained   Timeout: patient name, date of birth, surgical site, and procedure verified   Procedure prep:  Patient was prepped and draped in usual sterile fashion Prep type:  Isopropyl alcohol Anesthesia: the lesion was anesthetized in a standard fashion   Anesthetic:  1% lidocaine  w/ epinephrine 1-100,000 buffered w/ 8.4% NaHCO3 Instrument used: flexible razor blade   Hemostasis achieved with: pressure, aluminum chloride and  electrodesiccation   Outcome: patient tolerated procedure well   Post-procedure details: sterile dressing applied and wound care instructions given   Dressing type: bandage and petrolatum    Specimen  1 - Surgical pathology Differential Diagnosis: 0.4 cm DN VS BCC  Check Margins: No AK (ACTINIC KERATOSIS) (4) Left Forehead, Left Parietal Scalp, Mid Forehead, Right Malar Cheek Destruction of lesion - Left Forehead, Left Parietal Scalp, Mid Forehead, Right Malar Cheek Complexity: simple   Destruction method: cryotherapy   Informed consent: discussed and consent obtained   Timeout:  patient name, date of birth, surgical site, and procedure verified Lesion destroyed using liquid nitrogen: Yes   Region frozen until ice ball extended beyond lesion: Yes   Outcome: patient tolerated procedure well with no complications   Post-procedure details: wound care instructions given    Destruction of lesion - Left Forehead, Left Parietal Scalp, Mid Forehead, Right Malar Cheek SCREENING EXAM FOR SKIN CANCER   MULTIPLE BENIGN NEVI   SEBORRHEIC KERATOSIS   ACTINIC SKIN DAMAGE   CHERRY ANGIOMA   LENTIGINES   HISTORY OF NONMELANOMA SKIN CANCER   PERSONAL HISTORY OF MALIGNANT MELANOMA OF SKIN   Return in about 6 months (around 02/15/2025) for TBSE follow up.  I, Doyce Pan, CMA, am acting as scribe for Feras Gardella K, PA-C.   Documentation: I have reviewed the above documentation for accuracy and completeness, and I agree with the above.  Savahna Casados K, PA-C

## 2024-08-17 NOTE — Progress Notes (Deleted)
   Follow-Up Visit   Subjective  Charles Robles is a 78 y.o. male who presents for the following: rash  Patient present today for follow up visit for a rash on his ankle. Patient was last evaluated on 07/06/24. At this visit patient was prescribed triamcinolone  0.1%. Patient reports sxs are {DESC; BETTER/WORSE:18575}. Patient {Actions; denies-reports:120008} medication changes.  The following portions of the chart were reviewed this encounter and updated as appropriate: medications, allergies, medical history  Review of Systems:  No other skin or systemic complaints except as noted in HPI or Assessment and Plan.  Objective  Well appearing patient in no apparent distress; mood and affect are within normal limits.  ***A full examination was performed including scalp, head, eyes, ears, nose, lips, neck, chest, axillae, abdomen, back, buttocks, bilateral upper extremities, bilateral lower extremities, hands, feet, fingers, toes, fingernails, and toenails. All findings within normal limits unless otherwise noted below.   A focused examination was performed of the following areas: ***  Relevant exam findings are noted in the Assessment and Plan.  HISTORY OF SQUAMOUS CELL CARCINOMA OF THE SKIN - No evidence of recurrence today - No lymphadenopathy - Recommend regular full body skin exams - Recommend daily broad spectrum sunscreen SPF 30+ to sun-exposed areas, reapply every 2 hours as needed.  - Call if any new or changing lesions are noted between office visits       Assessment & Plan       No follow-ups on file.  ***  Documentation: I have reviewed the above documentation for accuracy and completeness, and I agree with the above.  SANDRIDGE,BRENDA K, PA-C

## 2024-08-17 NOTE — Patient Instructions (Addendum)

## 2024-08-18 ENCOUNTER — Encounter: Payer: Self-pay | Admitting: Dermatology

## 2024-08-18 LAB — DERMATOLOGY PATHOLOGY

## 2024-08-21 ENCOUNTER — Ambulatory Visit: Payer: Self-pay | Admitting: Physician Assistant

## 2024-08-22 ENCOUNTER — Ambulatory Visit: Admitting: Internal Medicine

## 2024-08-22 ENCOUNTER — Encounter: Payer: Self-pay | Admitting: Internal Medicine

## 2024-08-22 VITALS — BP 142/74 | HR 63 | Temp 98.4°F | Resp 16 | Ht 69.0 in | Wt 150.6 lb

## 2024-08-22 DIAGNOSIS — D510 Vitamin B12 deficiency anemia due to intrinsic factor deficiency: Secondary | ICD-10-CM

## 2024-08-22 DIAGNOSIS — I10 Essential (primary) hypertension: Secondary | ICD-10-CM

## 2024-08-22 DIAGNOSIS — Z23 Encounter for immunization: Secondary | ICD-10-CM

## 2024-08-22 DIAGNOSIS — D696 Thrombocytopenia, unspecified: Secondary | ICD-10-CM | POA: Diagnosis not present

## 2024-08-22 MED ORDER — CYANOCOBALAMIN 1000 MCG/ML IJ SOLN
1000.0000 ug | Freq: Once | INTRAMUSCULAR | Status: AC
  Administered 2024-08-22: 1000 ug via INTRAMUSCULAR

## 2024-08-22 MED ORDER — CYANOCOBALAMIN 2000 MCG PO TABS: 2000.0000 ug | ORAL_TABLET | Freq: Every day | ORAL | 0 refills | Status: AC

## 2024-08-22 NOTE — Patient Instructions (Signed)
 Thrombocytopenia Thrombocytopenia means that you have a low number of platelets in your blood. Platelets are tiny cells in the blood. When you bleed, they clump together at the cut or injury to stop the bleeding. This is called blood clotting. If you do not have enough platelets, your blood may have trouble clotting. This may cause you to bleed and bruise very easily. What are the causes? This condition is caused by a low number of platelets in your blood. There are three main reasons for this: Your body not making enough platelets. This may be caused by: Bone marrow diseases. Disorders that are passed from parent to child (inherited). Certain cancer medicines or treatments. Infection from germs (bacteria or viruses). Alcoholism. Platelets not being released in the blood. This can be caused by: Having a spleen that is larger than normal. A condition called Gaucher disease. Your body destroying platelets too quickly. This may be caused by: Certain autoimmune diseases. Some medicines that thin your blood. Certain blood clotting disorders. Certain bleeding disorders. Exposure to harmful (toxic) chemicals. Pregnancy. What are the signs or symptoms? Bruising easily. Bleeding from the nose or mouth. Heavy menstrual periods. Blood in the pee (urine), poop (stool), or vomit. A purple-red color to the skin (purpura). A rash that looks like pinpoint, purple-red spots (petechiae) on the lower legs. How is this treated? Treatment depends on the cause. Treatment may include: Treatment of another condition that is causing the low platelet count. Medicines to help protect your platelets from being destroyed. A replacement (transfusion) of platelets to stop or prevent bleeding. Surgery to take out the spleen. Follow these instructions at home: Medicines Take over-the-counter and prescription medicines only as told by your doctor. Do not take any medicines that have aspirin or NSAIDs, such as  ibuprofen. Activity Avoid doing things that could hurt or bruise you. Take action to prevent falls. Do not play contact sports. Ask your doctor what activities are safe for you. Take care not to burn yourself: When you use an iron. When you cook. Take care not to cut yourself: When you shave. When you use scissors, needles, knives, or other tools. General instructions  Check your skin and the inside of your mouth for bruises or blood as told by your doctor. Wear a medical alert bracelet that says that you have a bleeding disorder. Check to see if there is blood in your pee and poop. Do this as told by your doctor. Do not drink alcohol. If you do drink, limit the amount that you drink. Stay away from harmful (toxic) chemicals. Tell all of your doctors that you have this condition. Be sure to tell your dentist and eye doctor. Tell your dentist about your condition before you have your teeth cleaned. Keep all follow-up visits. Contact a doctor if: You have bruises and you do not know why. You have new symptoms. You have symptoms that get worse. You have a fever. Get help right away if: You have very bad bleeding anywhere on your body. You have blood in your vomit, pee, or poop. You have an injury to your head. You have a sudden, very bad headache. Summary Thrombocytopenia means that you have a low number of platelets in your blood. Platelets stick together to form a clot. Symptoms of this condition include getting bruises easily, bleeding from the mouth and nose, a purple-red color to the skin, and a rash. Take care not to cut or burn yourself. This information is not intended to replace advice given  to you by your health care provider. Make sure you discuss any questions you have with your health care provider. Document Revised: 04/18/2021 Document Reviewed: 04/18/2021 Elsevier Patient Education  2024 [employer].

## 2024-08-22 NOTE — Progress Notes (Signed)
 Subjective:  Patient ID: Charles Robles, male    DOB: 02-23-1946  Age: 78 y.o. MRN: [id number]  CC: Medical Management of Chronic Issues (3 month follow up. )   HPI Charles Robles presents for f/up --  Discussed the use of AI scribe software for clinical note transcription with the patient, who gave verbal consent to proceed.  History of Present Illness Charles Robles is a 78 year old male who presents with blurred vision and skin rash.  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 His vision improves with contact lenses, but he finds them cumbersome to wear regularly. The blurred vision has worsened since an episode of explosive diarrhea approximately 1.5 to 2 months ago, which lasted about a day. No headache, but he has mild, constant chest pain, more noticeable when he pays attention, but not during physical activity. No shortness of breath.  He has been experiencing a skin rash and itchiness since the episode of diarrhea. The rash migrates around his body and causes significant itching. He is currently taking Zyrtec, one pill twice a day, and using various topical treatments prescribed by a dermatologist, which help alleviate the itching. He has been seeing a dermatologist for this condition.  He mentions a history of low platelets but denies any bleeding. He reports bruising on his arm.  He reports feeling depressed due to family, government, and environmental issues. He describes a strained relationship with his brother, which has led to him easing off his relationship.  He drinks beer, particularly on weekends, and mentions having a glass of beer with a friend recently. He also plans to visit a place called [employer] for beer.     Outpatient Medications Prior to Visit  Medication Sig Dispense Refill   amoxicillin  (AMOXIL ) 500 MG tablet Take 4 tablets (2,000 mg total) by mouth as directed. Take 4  tablets 1 hour prior to dental work, including cleanings. 4 tablet 3   aspirin  EC 81 MG tablet Take 81 mg by mouth daily. Swallow whole.     ezetimibe  (ZETIA ) 10 MG tablet Take 10 mg by mouth daily.     fluticasone  (FLONASE ) 50 MCG/ACT nasal spray SHAKE LIQUID AND USE 2 SPRAYS IN EACH NOSTRIL DAILY 48 g 0   hydrocortisone 2.5 % cream Apply topically 2 (two) times daily as needed (Rash). 30 g 11   rosuvastatin  (CRESTOR ) 40 MG tablet Take 1 tablet (40 mg total) by mouth daily. 90 tablet 3   triamcinolone  ointment (KENALOG ) 0.1 % Apply 1 Application topically 2 (two) times daily. Apply to affected area of left lower leg twice daily until clear. 80 g 2   No facility-administered medications prior to visit.    ROS Review of Systems  Constitutional:  Negative for appetite change, chills, diaphoresis, fatigue and fever.  HENT: Negative.  Negative for sore throat and trouble swallowing.   Eyes: Negative.   Respiratory:  Negative for cough, chest tightness, shortness of breath and wheezing.   Cardiovascular:  Negative for chest pain, palpitations and leg swelling.  Gastrointestinal: Negative.  Negative for abdominal pain, blood in stool, constipation, diarrhea, nausea and vomiting.  Genitourinary: Negative.  Negative for difficulty urinating and hematuria.  Musculoskeletal: Negative.  Negative for arthralgias and myalgias.  Skin:  Positive for rash. Negative for color change, pallor and wound.  Neurological:  Negative for dizziness, weakness and light-headedness.  Hematological:  Negative for adenopathy. Bruises/bleeds easily.  Psychiatric/Behavioral: Negative.      Objective:  BP (!) 142/74 (BP Location: Left Arm, Patient Position: Sitting, Cuff Size: Normal)   Pulse 63   Temp 98.4 F (36.9 C) (Oral)   Resp 16   Ht 5' 9 (1.753 m)   Wt 150 lb 9.6 oz (68.3 kg)   SpO2 99%   BMI 22.24 kg/m   BP Readings from Last 3 Encounters:  08/22/24 (!) 142/74  08/17/24 (!) 169/80  07/25/24 (!)  142/86    Wt Readings from Last 3 Encounters:  08/22/24 150 lb 9.6 oz (68.3 kg)  05/18/24 154 lb (69.9 kg)  12/02/23 159 lb (72.1 kg)    Physical Exam Vitals reviewed.  Constitutional:      Appearance: Normal appearance.  HENT:     Nose: Nose normal.     Mouth/Throat:     Mouth: Mucous membranes are moist.  Eyes:     General: No scleral icterus.    Conjunctiva/sclera: Conjunctivae normal.  Cardiovascular:     Rate and Rhythm: Normal rate and regular rhythm.     Heart sounds: Murmur heard.     Systolic murmur is present with a grade of 2/6.     No friction rub. No gallop.  Pulmonary:     Effort: Pulmonary effort is normal.     Breath sounds: No stridor. No wheezing, rhonchi or rales.  Abdominal:     General: Abdomen is flat. There is abdominal bruit.     Palpations: There is no mass.     Tenderness: There is no abdominal tenderness. There is no guarding.     Hernia: No hernia is present.  Musculoskeletal:     Cervical back: Neck supple.     Right lower leg: No edema.     Left lower leg: No edema.  Lymphadenopathy:     Cervical: No cervical adenopathy.  Skin:    Findings: Bruising present.  Neurological:     General: No focal deficit present.     Mental Status: He is alert. Mental status is at baseline.  Psychiatric:        Mood and Affect: Mood normal.        Behavior: Behavior normal.     Lab Results  Component Value Date   WBC 5.4 05/18/2024   HGB 13.6 05/18/2024   HCT 40.7 05/18/2024   PLT 129.0 (L) 05/18/2024   GLUCOSE 99 05/18/2024   CHOL 208 (H) 10/23/2023   TRIG 91 10/23/2023   HDL 105 10/23/2023   LDLDIRECT 96.0 08/12/2022   LDLCALC 87 10/23/2023   ALT 42 05/18/2024   AST 35 05/18/2024   NA 138 05/18/2024   K 4.0 05/18/2024   CL 105 05/18/2024   CREATININE 1.06 05/18/2024   BUN 18 05/18/2024   CO2 26 05/18/2024   TSH 0.92 05/18/2024   PSA 0.96 09/17/2021   INR 1.0 07/08/2019   HGBA1C 5.6 11/11/2022    CT Chest Wo Contrast Result  Date: 10/04/2021 CLINICAL DATA:  History of pulmonary nodule, melanoma, aortic valve replacement EXAM: CT CHEST WITHOUT CONTRAST TECHNIQUE: Multidetector CT imaging of the chest was performed following the standard protocol without IV contrast. COMPARISON:  07/07/2020 FINDINGS: Cardiovascular: Limited without contrast. Thoracic aortic atherosclerosis and native coronary atherosclerosis scattered calcifications. Thoracic aorta normal in caliber. Negative for aneurysm. No mediastinal hemorrhage or hematoma. Previous TAVR noted. Normal heart size. Mediastinum/Nodes: No enlarged mediastinal or axillary lymph nodes. Thyroid  gland, trachea, and esophagus demonstrate no significant findings. Lungs/Pleura: Stable bibasilar subpleural  scarring. Stable 3 mm calcified granuloma in the right lung base. No acute airspace process, collapse or consolidation. No new or enlarging pulmonary nodule. Trachea and central airways are calcified but patent. No pleural abnormality, effusion, or pneumothorax. Upper Abdomen: Small hiatal hernia. Abdominal aortic atherosclerosis. No acute upper abdominal finding. Splenic calcified granuloma. Musculoskeletal: Degenerative changes of the spine. No acute osseous finding IMPRESSION: Stable bibasilar subpleural scarring, mild. Stable 3 mm calcified granuloma right lower lobe. Remote TAVR. Native thoracic aortic and coronary atherosclerosis. No suspicious or enlarging pulmonary nodule. Aortic Atherosclerosis (ICD10-I70.0). Electronically Signed   By: CHRISTELLA.  Shick M.D.   On: 10/04/2021 09:24    Assessment & Plan:  Need for immunization against influenza -     Flu vaccine HIGH DOSE PF(Fluzone Trivalent)  Thrombocytopenia- No bleeding.  Vitamin B12 deficiency anemia due to intrinsic factor deficiency -     Cyanocobalamin ; Take 1 tablet (2,000 mcg total) by mouth daily.  Dispense: 90 tablet; Refill: 0 -     Cyanocobalamin   Primary hypertension- BP is adequately well  controlled.     Follow-up: Return in about 3 months (around 11/22/2024).  Debby Molt, MD

## 2024-08-26 ENCOUNTER — Other Ambulatory Visit: Payer: Self-pay | Admitting: Physician Assistant

## 2024-08-30 NOTE — Telephone Encounter (Signed)
-----   Message from [facility] K sent at 08/22/2024  5:50 PM EDT ----- Right nasal bridge - BCC - SCHEDULE Mohs  ----- Message ----- From: Interface, Lab In [facility] Sent: 08/18/2024   5:19 PM EDT To: Erminio MARLA Like, PA-C

## 2024-09-05 ENCOUNTER — Telehealth: Payer: Self-pay

## 2024-09-05 NOTE — Telephone Encounter (Signed)
 Patient left a voicemail stating that he received a call last week from [facility] about scheduling Mohs on his nose. He states that he is not interested in having Mohs until the itching has resolved.

## 2024-09-07 ENCOUNTER — Telehealth: Payer: Self-pay

## 2024-09-07 ENCOUNTER — Ambulatory Visit (INDEPENDENT_AMBULATORY_CARE_PROVIDER_SITE_OTHER)

## 2024-09-07 VITALS — BP 138/72 | HR 71 | Ht 65.0 in | Wt 153.4 lb

## 2024-09-07 DIAGNOSIS — Z0000 Encounter for general adult medical examination without abnormal findings: Secondary | ICD-10-CM

## 2024-09-07 NOTE — Telephone Encounter (Signed)
 requesting nurse to call - needing refill on med & discuss dermatology referral/med/tx

## 2024-09-07 NOTE — Progress Notes (Signed)
 Subjective:   Charles Robles is a 78 y.o. who presents for a Medicare Wellness preventive visit.  As a reminder, Annual Wellness Visits don't include a physical exam, and some assessments may be limited, especially if this visit is performed virtually. We may recommend an in-person follow-up visit with your provider if needed.  Visit Complete: In person   Persons Participating in Visit: Patient.  AWV Questionnaire: No: Patient Medicare AWV questionnaire was not completed prior to this visit.  Cardiac Risk Factors include: advanced age (>97men, >5 women);dyslipidemia;hypertension;male gender     Objective:    Today's Vitals   09/07/24 1234  BP: 138/72  Pulse: 71  Weight: 153 lb 6.4 oz (69.6 kg)  Height: 5' 5 (1.651 m)   Body mass index is 25.53 kg/m.     09/07/2024   12:34 PM 09/04/2022    9:38 AM 09/27/2021    3:15 PM 08/27/2021    3:08 PM 05/10/2020    2:33 PM 07/12/2019    6:00 PM 07/08/2019    1:34 PM  Advanced Directives  Does Patient Have a Medical Advance Directive? Yes Yes No Yes No Yes Yes  Type of Estate agent of [place];Living will Healthcare Power of [place];Living will  Living will;Healthcare Power of [employer] Power of [employer] Power of [place];Living will  Does patient want to make changes to medical advance directive?    No - Patient declined No - Patient declined No - Patient declined   Copy of Healthcare Power of Attorney in Chart? No - copy requested No - copy requested  No - copy requested  No - copy requested No - copy requested   Would patient like information on creating a medical advance directive?   No - Patient declined  No - Patient declined       Data saved with a previous flowsheet row definition    Current Medications (verified) Outpatient Encounter Medications as of 09/07/2024  Medication Sig   amoxicillin  (AMOXIL ) 500 MG tablet Take 4 tablets (2,000 mg total) by mouth as  directed. Take 4 tablets 1 hour prior to dental work, including cleanings.   aspirin  EC 81 MG tablet Take 81 mg by mouth daily. Swallow whole.   cyanocobalamin  2000 MCG tablet Take 1 tablet (2,000 mcg total) by mouth daily.   ezetimibe  (ZETIA ) 10 MG tablet Take 10 mg by mouth daily.   fluticasone  (FLONASE ) 50 MCG/ACT nasal spray SHAKE LIQUID AND USE 2 SPRAYS IN EACH NOSTRIL DAILY   hydrocortisone 2.5 % cream Apply topically 2 (two) times daily as needed (Rash).   rosuvastatin  (CRESTOR ) 20 MG tablet Take 1 tablet (20 mg total) by mouth daily. PT. MUST MAKE AN APPOINTMENT IN ORDER TO RECEIVE ADDITIONAL REFILLS, FIRST ATTEMPT.   triamcinolone  ointment (KENALOG ) 0.1 % Apply 1 Application topically 2 (two) times daily. Apply to affected area of left lower leg twice daily until clear.   rosuvastatin  (CRESTOR ) 40 MG tablet Take 1 tablet (40 mg total) by mouth daily. (Patient not taking: Reported on 09/07/2024)   No facility-administered encounter medications on file as of 09/07/2024.    Allergies (verified) Patient has no known allergies.   History: Past Medical History:  Diagnosis Date   Abnormal large bowel motility    Arthritis    Basal cell carcinoma 2017   left back bcc tx cx3 [id number]   Basal cell carcinoma 08/17/2024   Right nasal bridge - needs Mohs   Hearing loss    Heart  murmur    Hyperlipidemia    Hypertension    Melanoma (HCC)    melanoma per patient years ago   Other fatigue    Pulmonary nodule    a. noted on pre TAVR CT, will need 1 year follow up given smoking history    S/P TAVR (transcatheter aortic valve replacement)    a. s/p TAVR with a 26 mm Edwards sapein 3 Ultra valve via the TF approach on 07/12/19   Severe aortic stenosis    a. s/p TAVR with a 26 mm Edwards sapein 3 Ultra valve via the TF approach on 07/12/19   Squamous cell carcinoma of skin 06/01/2024   right proximal forearm- appt Mohs Dr. Corey 07/25/2024   Stool incontinence    Urgency of urination    Vision  loss of right eye    Past Surgical History:  Procedure Laterality Date   COLONOSCOPY  2017   EYE SURGERY  1981    Right eye- corneal transplant   HERNIA REPAIR     x3   POLYPECTOMY     RIGHT/LEFT HEART CATH AND CORONARY ANGIOGRAPHY N/A 06/23/2018   Procedure: RIGHT/LEFT HEART CATH AND CORONARY ANGIOGRAPHY;  Surgeon: Verlin Lonni BIRCH, MD;  Location: [facility] INVASIVE CV LAB;  Service: Cardiovascular;  Laterality: N/A;   TEE WITHOUT CARDIOVERSION N/A 07/12/2019   Procedure: TRANSESOPHAGEAL ECHOCARDIOGRAM (TEE);  Surgeon: Verlin Lonni BIRCH, MD;  Location[facility];  Service: Open Heart Surgery;  Laterality: N/A;   TONSILLECTOMY     TRANSCATHETER AORTIC VALVE REPLACEMENT, TRANSFEMORAL N/A 07/12/2019   Procedure: TRANSCATHETER AORTIC VALVE REPLACEMENT, TRANSFEMORAL;  Surgeon: Verlin Lonni BIRCH, MD;  Location: [facility] OR;  Service: Open Heart Surgery;  Laterality: N/A;   Family History  Problem Relation Age of Onset   Heart disease Father        MI   Hyperlipidemia Father    Hypertension Mother    Alcohol abuse Mother    Hyperlipidemia Brother    Hypertension Brother    Colon cancer Neg Hx    Colon polyps Neg Hx    Rectal cancer Neg Hx    Stomach cancer Neg Hx    Esophageal cancer Neg Hx    Social History   Socioeconomic History   Marital status: Divorced    Spouse name: Not on file   Number of children: 0   Years of education: Not on file   Highest education level: Not on file  Occupational History   Occupation: Retired-Psychologist  Tobacco Use   Smoking status: Some Days    Types: Cigars   Smokeless tobacco: Never  Vaping Use   Vaping status: Never Used  Substance and Sexual Activity   Alcohol use: Yes    Alcohol/week: 32.0 standard drinks of alcohol    Types: 30 Cans of beer, 2 Glasses of wine per week   Drug use: Yes    Types: Marijuana    Comment: 1-2 week   Sexual activity: Not Currently  Other Topics Concern   Not on file  Social History Narrative   Not  on file   Social Drivers of Health   Financial Resource Strain: Low Risk  (09/07/2024)   Overall Financial Resource Strain (CARDIA)    Difficulty of Paying Living Expenses: Not hard at all  Food Insecurity: No Food Insecurity (09/07/2024)   Hunger Vital Sign    Worried About Running Out of Food in the Last Year: Never true    Ran Out of Food in the Last Year:  Never true  Transportation Needs: No Transportation Needs (09/07/2024)   PRAPARE - Administrator, Civil Service (Medical): No    Lack of Transportation (Non-Medical): No  Physical Activity: Sufficiently Active (09/07/2024)   Exercise Vital Sign    Days of Exercise per Week: 7 days    Minutes of Exercise per Session: 30 min  Stress: No Stress Concern Present (09/07/2024)   [employer] of Occupational Health - Occupational Stress Questionnaire    Feeling of Stress: Only a little  Social Connections: Moderately Integrated (09/07/2024)   Social Connection and Isolation Panel    Frequency of Communication with Friends and Family: More than three times a week    Frequency of Social Gatherings with Friends and Family: More than three times a week    Attends Religious Services: 1 to 4 times per year    Active Member of [employer] or Organizations: Yes    Attends Banker Meetings: 1 to 4 times per year    Marital Status: Divorced    Tobacco Counseling Ready to quit: Not Answered Counseling given: Not Answered    Clinical Intake:  Pre-visit preparation completed: Yes  Pain : No/denies pain     BMI - recorded: 25.53 Nutritional Status: BMI 25 -29 Overweight Nutritional Risks: None Diabetes: No  Lab Results  Component Value Date   HGBA1C 5.6 11/11/2022   HGBA1C 5.4 05/16/2021   HGBA1C 5.5 07/08/2019     How often do you need to have someone help you when you read instructions, pamphlets, or other written materials from your doctor or pharmacy?: 1 - Never  Interpreter Needed?:  No  Information entered by :: Verdie Saba, CMA   Activities of Daily Living     09/07/2024   12:37 PM  In your present state of health, do you have any difficulty performing the following activities:  Hearing? 0  Vision? 0  Difficulty concentrating or making decisions? 0  Walking or climbing stairs? 0  Dressing or bathing? 0  Doing errands, shopping? 0  Preparing Food and eating ? N  Using the Toilet? N  In the past six months, have you accidently leaked urine? N  Do you have problems with loss of bowel control? Y  Managing your Medications? N  Managing your Finances? N  Housekeeping or managing your Housekeeping? N    Patient Care Team: Joshua Debby CROME, MD as PCP - General (Internal Medicine) Jeffrie Oneil BROCKS, MD as PCP - Cardiology (Cardiology) Leslee Reusing, MD as Consulting Physician (Ophthalmology) Orman Erminio POUR, PA-C as Physician Assistant (Dermatology)  I have updated your Care Teams any recent Medical Services you may have received from other providers in the past year.     Assessment:   This is a routine wellness examination for [place].  Hearing/Vision screen Hearing Screening - Comments:: Denies hearing difficulties   Vision Screening - Comments:: Wears rx glasses - up to date with routine eye exams with Reusing Leslee   Goals Addressed               This Visit's Progress     Patient Stated (pt-stated)        Patient stated he plans to stay active - keep walking       Depression Screen     09/07/2024   12:38 PM 05/18/2024   11:03 AM 12/02/2023    3:02 PM 09/04/2022    9:22 AM 02/10/2022    2:09 PM 08/27/2021    3:10 PM  09/12/2020    1:34 PM  PHQ 2/9 Scores  PHQ - 2 Score 0 0 0 0 0 0 0  PHQ- 9 Score 1          Fall Risk     09/07/2024   12:37 PM 08/22/2024    8:32 AM 05/18/2024   11:02 AM 12/02/2023    3:02 PM 09/04/2022    9:23 AM  Fall Risk   Falls in the past year? 0 0 1 1 0  Number falls in past yr: 0 0 0 0 0   Injury with Fall? 0 0 1 1 0  Risk for fall due to : No Fall Risks;Impaired balance/gait No Fall Risks No Fall Risks No Fall Risks No Fall Risks  Follow up Falls evaluation completed;Falls prevention discussed Falls evaluation completed Falls evaluation completed Falls evaluation completed Falls prevention discussed      Data saved with a previous flowsheet row definition    MEDICARE RISK AT HOME:  Medicare Risk at Home Any stairs in or around the home?: Yes If so, are there any without handrails?: No Home free of loose throw rugs in walkways, pet beds, electrical cords, etc?: Yes Adequate lighting in your home to reduce risk of falls?: Yes Life alert?: No Use of a cane, walker or w/c?: Yes (cane) Grab bars in the bathroom?: Yes Shower chair or bench in shower?: Yes Elevated toilet seat or a handicapped toilet?: No  TIMED UP AND GO:  Was the test performed?  No  Cognitive Function: 6CIT completed        09/07/2024   12:41 PM 09/04/2022    9:23 AM 05/10/2020    2:33 PM 02/17/2019    3:02 PM  6CIT Screen  What Year? 0 points 0 points 0 points 0 points  What month? 0 points 0 points 0 points 0 points  What time? 0 points 0 points 0 points 0 points  Count back from 20 0 points 0 points 0 points 0 points  Months in reverse 0 points 0 points 0 points 0 points  Repeat phrase 0 points 0 points 2 points 0 points  Total Score 0 points 0 points 2 points 0 points    Immunizations Immunization History  Administered Date(s) Administered   Fluad Quad(high Dose 65+) 09/12/2020, 09/17/2021, 08/12/2022   INFLUENZA, HIGH DOSE SEASONAL PF 08/22/2024   PFIZER(Purple Top)SARS-COV-2 Vaccination 01/15/2020, 02/08/2020, 05/29/2021   Pneumococcal Polysaccharide-23 04/24/2021    Screening Tests Health Maintenance  Topic Date Due   Zoster Vaccines- Shingrix  (1 of 2) Never done   DTaP/Tdap/Td (1 - Tdap) 05/18/2025 (Originally 05/29/1965)   Pneumococcal Vaccine: 50+ Years (2 of 2 - PCV)  05/18/2025 (Originally 04/24/2022)   Colonoscopy  05/18/2025 (Originally 01/19/2023)   Medicare Annual Wellness (AWV)  09/07/2025   Influenza Vaccine  Completed   Hepatitis C Screening  Completed   Meningococcal B Vaccine  Aged Out   COVID-19 Vaccine  Discontinued    Health Maintenance Items Addressed:  Additional Screening:  Vision Screening: Recommended annual ophthalmology exams for early detection of glaucoma and other disorders of the eye. Is the patient up to date with their annual eye exam?  Yes  Who is the provider or what is the name of the office in which the patient attends annual eye exams? [facility]  Dental Screening: Recommended annual dental exams for proper oral hygiene  Community Resource Referral / Chronic Care Management: CRR required this visit?  No   CCM required this visit?  No   Plan:    I have personally reviewed and noted the following in the patient's chart:   Medical and social history Use of alcohol, tobacco or illicit drugs  Current medications and supplements including opioid prescriptions. Patient is not currently taking opioid prescriptions. Functional ability and status Nutritional status Physical activity Advanced directives List of other physicians Hospitalizations, surgeries, and ER visits in previous 12 months Vitals Screenings to include cognitive, depression, and falls Referrals and appointments  In addition, I have reviewed and discussed with patient certain preventive protocols, quality metrics, and best practice recommendations. A written personalized care plan for preventive services as well as general preventive health recommendations were provided to patient.   Verdie CHRISTELLA Saba, CMA   09/07/2024   After Visit Summary: (In Person-Declined) Patient declined AVS at this time.  Notes: Scheduled 3-mth f/u appt w/PCP for 11/2024

## 2024-09-07 NOTE — Telephone Encounter (Signed)
 Patient left a voicemail asking that you contact him directly to address his itching concerns.

## 2024-09-07 NOTE — Patient Instructions (Addendum)
 Charles Robles,  Thank you for taking the time for your Medicare Wellness Visit. I appreciate your continued commitment to your health goals. Please review the care plan we discussed, and feel free to reach out if I can assist you further.  Medicare recommends these wellness visits once per year to help you and your care team stay ahead of potential health issues. These visits are designed to focus on prevention, allowing your provider to concentrate on managing your acute and chronic conditions during your regular appointments.  Please note that Annual Wellness Visits do not include a physical exam. Some assessments may be limited, especially if the visit was conducted virtually. If needed, we may recommend a separate in-person follow-up with your provider.  Ongoing Care Seeing your primary care provider every 3 to 6 months helps us  monitor your health and provide consistent, personalized care.   Referrals If a referral was made during today's visit and you haven't received any updates within two weeks, please contact the referred provider directly to check on the status.  Recommended Screenings:  Health Maintenance  Topic Date Due   Zoster (Shingles) Vaccine (1 of 2) Never done   DTaP/Tdap/Td vaccine (1 - Tdap) 05/18/2025*   Pneumococcal Vaccine for age over 43 (2 of 2 - PCV) 05/18/2025*   Colon Cancer Screening  05/18/2025*   Medicare Annual Wellness Visit  09/07/2025   Flu Shot  Completed   Hepatitis C Screening  Completed   Meningitis B Vaccine  Aged Out   COVID-19 Vaccine  Discontinued  *Topic was postponed. The date shown is not the original due date.       09/07/2024   12:34 PM  Advanced Directives  Does Patient Have a Medical Advance Directive? Yes  Type of Estate agent of [place];Living will  Copy of Healthcare Power of Attorney in Chart? No - copy requested   Advance Care Planning is important because it: Ensures you receive medical care that  aligns with your values, goals, and preferences. Provides guidance to your family and loved ones, reducing the emotional burden of decision-making during critical moments.  Vision: Annual vision screenings are recommended for early detection of glaucoma, cataracts, and diabetic retinopathy. These exams can also reveal signs of chronic conditions such as diabetes and high blood pressure.  Dental: Annual dental screenings help detect early signs of oral cancer, gum disease, and other conditions linked to overall health, including heart disease and diabetes.

## 2024-09-14 ENCOUNTER — Encounter: Payer: Self-pay | Admitting: Physician Assistant

## 2024-09-14 ENCOUNTER — Ambulatory Visit (INDEPENDENT_AMBULATORY_CARE_PROVIDER_SITE_OTHER): Admitting: Physician Assistant

## 2024-09-14 VITALS — BP 132/84

## 2024-09-14 DIAGNOSIS — C44311 Basal cell carcinoma of skin of nose: Secondary | ICD-10-CM | POA: Diagnosis not present

## 2024-09-14 DIAGNOSIS — L0100 Impetigo, unspecified: Secondary | ICD-10-CM

## 2024-09-14 MED ORDER — DOXYCYCLINE HYCLATE 100 MG PO TABS
100.0000 mg | ORAL_TABLET | Freq: Two times a day (BID) | ORAL | 0 refills | Status: AC
Start: 2024-09-14 — End: 2024-10-14

## 2024-09-14 NOTE — Progress Notes (Signed)
   New Patient Visit   Subjective  Charles Robles is a 77 y.o. male who presents for the following: Rash  Last visit on 08/17/24. An itchy rash was discussed at that visit and was instructed to use Vanicream soap. He was prescribed triamcinolone  and hydrocortisone cream to apply daily as needed for itch. Rash has not resolved completely.   Also here to discuss biopsy proven BCC on nose. He declined scheduling due to his current rash.   The following portions of the chart were reviewed this encounter and updated as appropriate: medications, allergies, medical history  Review of Systems:  No other skin or systemic complaints except as noted in HPI or Assessment and Plan.  Objective  Well appearing patient in no apparent distress; mood and affect are within normal limits.  FULL BODY SKIN EXAM performed    Relevant exam findings are noted in the Assessment and Plan.    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    Return if symptoms worsen or fail to improve.  I, Gordan Beams, CMA, am acting as scribe for Savaughn Karwowski K, PA-C.   Documentation: I have reviewed the above documentation for accuracy and completeness, and I agree with the above.  Charles Robles K, PA-C

## 2024-09-14 NOTE — Patient Instructions (Signed)

## 2024-09-16 NOTE — Telephone Encounter (Signed)
 Patient has gotten his derm referral handled and his derm medication. His Crestor  is followed by Dr. Jeffrie office I advised him to give them a call.

## 2024-09-27 ENCOUNTER — Other Ambulatory Visit: Payer: Self-pay | Admitting: Cardiology

## 2024-10-21 ENCOUNTER — Telehealth: Payer: Self-pay | Admitting: Cardiology

## 2024-10-21 MED ORDER — ROSUVASTATIN CALCIUM 20 MG PO TABS: 20.0000 mg | ORAL_TABLET | Freq: Every day | ORAL | 0 refills | Status: AC

## 2024-10-21 NOTE — Telephone Encounter (Signed)
*  STAT* If patient is at the pharmacy, call can be transferred to refill team.   1. Which medications need to be refilled? (please list name of each medication and dose if known)   rosuvastatin  (CRESTOR ) 20 MG tablet    2. Which pharmacy/location (including street and city if local pharmacy) is medication to be sent to?  [facility] [phone] - [place], [place] - [address]      3. Do they need a 30 day or 90 day supply? 90 day    Pt is out of medication and has an office visit scheduled on 12/19 at [facility] office.

## 2024-10-25 ENCOUNTER — Ambulatory Visit: Admitting: Physician Assistant

## 2024-10-25 VITALS — BP 179/90

## 2024-10-25 DIAGNOSIS — L209 Atopic dermatitis, unspecified: Secondary | ICD-10-CM | POA: Diagnosis not present

## 2024-10-25 DIAGNOSIS — C44319 Basal cell carcinoma of skin of other parts of face: Secondary | ICD-10-CM | POA: Diagnosis not present

## 2024-10-25 DIAGNOSIS — L810 Postinflammatory hyperpigmentation: Secondary | ICD-10-CM | POA: Diagnosis not present

## 2024-10-25 MED ORDER — CLOBETASOL PROPIONATE 0.05 % EX OINT: 1.0000 | TOPICAL_OINTMENT | Freq: Two times a day (BID) | CUTANEOUS | 1 refills | Status: AC

## 2024-10-25 NOTE — Patient Instructions (Signed)

## 2024-10-25 NOTE — Progress Notes (Unsigned)
   Follow-Up Visit   Subjective  Charles Robles is a 78 y.o. male who presents for the following: He thinks his impetigo has come back and it is itchy. He states he is using various creams but does not know what they are. He is using Calahist and taking Zyrtec.  There is also a spot on his back that his masseuse said needs to be checked.    The following portions of the chart were reviewed this encounter and updated as appropriate: medications, allergies, medical history  Review of Systems:  No other skin or systemic complaints except as noted in HPI or Assessment and Plan.  Objective  Well appearing patient in no apparent distress; mood and affect are within normal limits.   A focused examination was performed of the following areas:   Relevant exam findings are noted in the Assessment and Plan.  Right nasal bridge Well healed biopsy site  Assessment & Plan   ATOPIC DERMATITIS Exam: Scaly pink papules coalescing to plaques    Treatment Plan: Clobetasol  ointment Apply to affected areas twice daily until clear.  Recommend gentle skin care.   BASAL CELL CARCINOMA (BCC) OF SKIN OF OTHER PART OF FACE Right nasal bridge Patient has refuses Mohs procedure at last appointment.   POST-INFLAMMATORY HYPERPIGMENTATION (PIH) Exam: hyperpigmented macules and/or patches at left ankle   Post-inflammatory hyperpimentation (PIH)  is a benign condition that comes from having previous inflammation in the skin and will fade with time over months to sometimes years. Recommend daily sun protection including sunscreen SPF 30+ to sun-exposed areas. - Recommend treating any itchy or red areas on the skin quickly to prevent new areas of PIH. Once rash has cleared, treating with prescription medicines such as hydroquinone may help fade dark spots faster.    Treatment Plan:  No treatment at this time    Return if symptoms worsen or fail to improve.  I, Roseline Hutchinson, CMA, am acting  as scribe for Jashanti Clinkscale K, PA-C .   Documentation: I have reviewed the above documentation for accuracy and completeness, and I agree with the above.  [place] Ndiaye K, PA-C

## 2024-10-26 ENCOUNTER — Encounter: Payer: Self-pay | Admitting: Physician Assistant

## 2024-11-04 ENCOUNTER — Ambulatory Visit (HOSPITAL_BASED_OUTPATIENT_CLINIC_OR_DEPARTMENT_OTHER): Admitting: Family

## 2024-11-21 NOTE — Progress Notes (Deleted)
" ° °  Cardiology Office Note    Date:  11/21/2024  ID:  Charles Robles, Charles Robles 05/22/1946, MRN [id number] PCP:  Joshua Debby CROME, MD  Cardiologist:  Oneil Parchment, MD  Electrophysiologist:  None   Chief Complaint: ***  History of Present Illness: .    Charles Robles is a 79 y.o. male with visit-pertinent history of severe AS s/p TAVR 06/2019, mild nonobstructive CAD by pre-TAVR cath 2019 (20% p-mRCA, 20% p-mLAD, 30% pCx, 30% mCx), HTN, HLD, pulmonary nodule, tobacco use seen for follow-up. CT chest 2022 by PCP showed stable granuloma RLL with otherwise no suspicious or enlarging pulm nodule, + aortic atherosclerosis. Last echo 06/2023 showed EF 60-65%, mild LVH, G1DD, mildly enlarged RV (similar to prior), normal TAVR function/structure.  Echo due sbe ppx Cbc platelet count declining Lipids due  CAD, aortic atherosclerosis Severe AS s/p TAVR Essential HTN Hyperlipidemia  Labwork independently reviewed: 05/2024 TSH ok, LFTs ok, Hgb 13.6, plt 129 (intermittently decreased), Cr 1.06 10/2023 LDL 87, trig 91  ROS: .    Please see the history of present illness. Otherwise, review of systems is positive for ***.  All other systems are reviewed and otherwise negative.  Studies Reviewed: SABRA    EKG:  EKG is ordered today, personally reviewed, demonstrating ***  CV Studies: Cardiac studies reviewed are outlined and summarized above. Otherwise please see EMR for full report.   Current Reported Medications:.    Active Medications[1]  Physical Exam:    VS:  There were no vitals taken for this visit.   Wt Readings from Last 3 Encounters:  09/07/24 153 lb 6.4 oz (69.6 kg)  08/22/24 150 lb 9.6 oz (68.3 kg)  05/18/24 154 lb (69.9 kg)    GEN: Well nourished, well developed in no acute distress NECK: No JVD; No carotid bruits CARDIAC: ***RRR, no murmurs, rubs, gallops RESPIRATORY:  Clear to auscultation without rales, wheezing or rhonchi  ABDOMEN: Soft, non-tender,  non-distended EXTREMITIES:  No edema; No acute deformity   Asessement and Plan:.     ***     Disposition: F/u with ***  Signed, Braven Wolk N Loletta Harper, PA-C      [1]  No outpatient medications have been marked as taking for the 11/22/24 encounter (Appointment) with [place] Droll N, PA-C.   "

## 2024-11-22 ENCOUNTER — Ambulatory Visit: Admitting: Physician Assistant

## 2024-11-23 ENCOUNTER — Ambulatory Visit: Admitting: Internal Medicine

## 2024-12-05 NOTE — Progress Notes (Unsigned)
 "  Cardiology Office Note    Date:  12/06/2024  ID:  Charles Robles, Charles Robles July 09, 1946, MRN [id number] PCP:  Joshua Debby CROME, MD  Cardiologist:  Oneil Parchment, MD  Electrophysiologist:  None   Chief Complaint: f/u TAVR, CAD, chest pain  History of Present Illness: .    Charles Robles is a 79 y.o. male with visit-pertinent history of severe AS s/p TAVR 06/2019, mild nonobstructive CAD by pre-TAVR cath 2019 (20% p-mRCA, 20% p-mLAD, 30% pCx, 30% mCx), HTN, HLD, pulmonary nodule, tobacco use seen for follow-up. CT chest 2022 by PCP showed stable granuloma RLL with otherwise no suspicious or enlarging pulm nodule, + aortic atherosclerosis. Last echo 06/2023 showed EF 60-65%, mild LVH, G1DD, mildly enlarged RV (similar to prior), normal TAVR function/structure.  He returns for follow-up noting generalized fatigue. For the last 3 weeks he has had an episodic mild nagging chest discomfort sensation to the left of the sternum. He is unable to really say how often this happens because it has been mild. Sometimes it has happened a few times a day. It lasts maybe a few minutes without provocation then goes away. It is not worse with palpation, inspiration, position, exertion or food. He is not having any today. He reports a sensation of restricted breathing if he tries to inhale deeply but it is not painful. He had a head cold a few weeks ago. No change in baseline mild DOE or fatigue. No weight changes or bleeding.  Labwork independently reviewed: 05/2024 TSH ok, LFTs ok, Hgb 13.6, plt 129 (intermittently decreased), Cr 1.06 10/2023 LDL 87, trig 91  ROS: .    Please see the history of present illness.  All other systems are reviewed and otherwise negative.  Studies Reviewed: SABRA    EKG:  EKG is ordered today, personally reviewed, demonstrating  EKG Interpretation Date/Time:  Tuesday December 06 2024 12:30:11 EST Ventricular Rate:  64 PR Interval:  196 QRS Duration:  90 QT  Interval:  430 QTC Calculation: 443 R Axis:   57  Text Interpretation: Normal sinus rhythm Nonspecific ST and T wave abnormality No acute changes Baseline wander seen in lead V4, but visually confirmed stable on EKG screen prior to printing (EKG technically difficult despite multiple tries) Reconfirmed by Hesper Venturella [phone]) on 12/06/2024 12:51:44 PM    Asked CMA to place IT ticket to have first 2 tracings removed due to artifact. This was likely due to hair on chest and adhesion issues with leads.  CV Studies: Cardiac studies reviewed are outlined and summarized above. Otherwise please see EMR for full report.   Current Reported Medications:.    Active Medications[1]  Physical Exam:    VS:  BP 132/70 (BP Location: Left Arm, Patient Position: Sitting, Cuff Size: Normal)   Pulse 74   Ht 5' 9 (1.753 m)   Wt 153 lb 3.2 oz (69.5 kg)   SpO2 97%   BMI 22.62 kg/m    Wt Readings from Last 3 Encounters:  12/06/24 153 lb 3.2 oz (69.5 kg)  09/07/24 153 lb 6.4 oz (69.6 kg)  08/22/24 150 lb 9.6 oz (68.3 kg)    GEN: Well nourished, well developed in no acute distress NECK: No JVD; No carotid bruits CARDIAC: RRR, soft SEM, no rubs or gallops RESPIRATORY:  Clear to auscultation without rales, wheezing or rhonchi  ABDOMEN: Soft, non-tender, non-distended EXTREMITIES:  No edema; No acute deformity   Asessement and Plan:.    1. Precordial pain - discussed CXR,  stress test. Patient declines any additional workup at this time. He feels it has been mild and not bothersome. He is not tachycardic, tachypneic or having pleuritic chest discomfort. He will notify if he changes his mind. Will get CMET, CBC with labs today. He is agreeable to surveillance echo for TAVR.  2. CAD, aortic atherosclerosis, HLD - patient declined stress testing at this time. Continue ASA 81mg  daily , rosuvastatin  20mg  daily, ezetimibe  10mg  daily. Check lipids/direct LDL today.   3. Severe AS s/p TAVR - update  echocardiogram. He is amenable to this. Also reinforced need for SBE ppx and sent in amoxicillin  to have on hand for use.  4. Essential HTN - repeat SBP 124/70 by me. He reports home readings are OK. Continue to monitor clinically. Not presently requiring medication for this.    Disposition: F/u with Dr. Jeffrie in 6 months, sooner if patient desires for chest pain evaluation.  Signed, Koralynn Greenspan N Neils Siracusa, PA-C      [1]  Current Meds  Medication Sig   amoxicillin  (AMOXIL ) 500 MG tablet Take 4 tablets (2,000 mg total) by mouth as directed. Take 4 tablets 1 hour prior to dental work, including cleanings.   aspirin  EC 81 MG tablet Take 81 mg by mouth daily. Swallow whole.   clobetasol  ointment (TEMOVATE ) 0.05 % Apply 1 Application topically 2 (two) times daily. Apply to affected areas twice daily until clear.   cyanocobalamin  2000 MCG tablet Take 1 tablet (2,000 mcg total) by mouth daily.   ezetimibe  (ZETIA ) 10 MG tablet Take 10 mg by mouth daily.   fluticasone  (FLONASE ) 50 MCG/ACT nasal spray SHAKE LIQUID AND USE 2 SPRAYS IN EACH NOSTRIL DAILY   hydrocortisone  2.5 % cream Apply topically 2 (two) times daily as needed (Rash).   rosuvastatin  (CRESTOR ) 20 MG tablet Take 1 tablet (20 mg total) by mouth daily.   triamcinolone  ointment (KENALOG ) 0.1 % Apply 1 Application topically 2 (two) times daily. Apply to affected area of left lower leg twice daily until clear.   "

## 2024-12-06 ENCOUNTER — Encounter: Payer: Self-pay | Admitting: Physician Assistant

## 2024-12-06 ENCOUNTER — Ambulatory Visit: Attending: Physician Assistant | Admitting: Physician Assistant

## 2024-12-06 VITALS — BP 132/70 | HR 74 | Ht 69.0 in | Wt 153.2 lb

## 2024-12-06 DIAGNOSIS — E785 Hyperlipidemia, unspecified: Secondary | ICD-10-CM | POA: Insufficient documentation

## 2024-12-06 DIAGNOSIS — R072 Precordial pain: Secondary | ICD-10-CM | POA: Insufficient documentation

## 2024-12-06 DIAGNOSIS — I10 Essential (primary) hypertension: Secondary | ICD-10-CM | POA: Diagnosis not present

## 2024-12-06 DIAGNOSIS — I700 Atherosclerosis of aorta: Secondary | ICD-10-CM | POA: Diagnosis not present

## 2024-12-06 DIAGNOSIS — Z952 Presence of prosthetic heart valve: Secondary | ICD-10-CM | POA: Insufficient documentation

## 2024-12-06 DIAGNOSIS — I350 Nonrheumatic aortic (valve) stenosis: Secondary | ICD-10-CM | POA: Insufficient documentation

## 2024-12-06 DIAGNOSIS — I2510 Atherosclerotic heart disease of native coronary artery without angina pectoris: Secondary | ICD-10-CM | POA: Diagnosis not present

## 2024-12-06 LAB — LDL CHOLESTEROL, DIRECT

## 2024-12-06 MED ORDER — EZETIMIBE 10 MG PO TABS: 10.0000 mg | ORAL_TABLET | Freq: Every day | ORAL | 3 refills | Status: AC

## 2024-12-06 MED ORDER — AMOXICILLIN 500 MG PO TABS: 2000.0000 mg | ORAL_TABLET | ORAL | 1 refills | Status: AC

## 2024-12-06 NOTE — Patient Instructions (Addendum)
 Medication Instructions:  NO CHANGES *If you need a refill on your cardiac medications before your next appointment, please call your pharmacy*  Lab Work: FLP, DIRECT LDL, CBC,CMET TODAY If you have labs (blood work) drawn today and your tests are completely normal, you will receive your results only by: MyChart Message (if you have MyChart) OR A paper copy in the mail If you have any lab test that is abnormal or we need to change your treatment, we will call you to review the results.  Testing/Procedures:[address] Your physician has requested that you have an echocardiogram. Echocardiography is a painless test that uses sound waves to create images of your heart. It provides your doctor with information about the size and shape of your heart and how well your hearts chambers and valves are working. This procedure takes approximately one hour. There are no restrictions for this procedure. Please do NOT wear cologne, perfume, aftershave, or lotions (deodorant is allowed). Please arrive 15 minutes prior to your appointment time.  Please note: We ask at that you not bring children with you during ultrasound (echo/ vascular) testing. Due to room size and safety concerns, children are not allowed in the ultrasound rooms during exams. Our front office staff cannot provide observation of children in our lobby area while testing is being conducted. An adult accompanying a patient to their appointment will only be allowed in the ultrasound room at the discretion of the ultrasound technician under special circumstances. We apologize for any inconvenience.   Follow-Up: At [facility], you and your health needs are our priority.  As part of our continuing mission to provide you with exceptional heart care, our providers are all part of one team.  This team includes your primary Cardiologist (physician) and Advanced Practice Providers or APPs (Physician Assistants and Nurse Practitioners)  who all work together to provide you with the care you need, when you need it.  Your next appointment:   6 month(s)  Provider:   Oneil Parchment, MD   We recommend signing up for the patient portal called MyChart.  Sign up information is provided on this After Visit Summary.  MyChart is used to connect with patients for Virtual Visits (Telemedicine).  Patients are able to view lab/test results, encounter notes, upcoming appointments, etc.  Non-urgent messages can be sent to your provider as well.   To learn more about what you can do with MyChart, go to [email or web address].         Endocarditis Information  You may be at risk for developing endocarditis since you have an artificial heart valve or a repaired heart valve. Endocarditis is an infection of the lining of the heart or heart valves. Certain surgical and dental procedures may put you at risk, such as teeth cleaning or other dental procedures or other medical procedures. Notify our office or your dentist before having any dental work or invasive/surgical procedures. Do not forget that you will need to take antibiotics before certain procedures, including any dental cleanings or procedures. To prevent endocarditis, maintain good oral health. Seek prompt medical attention for any mouth/gum, skin or urinary tract infections.

## 2024-12-07 ENCOUNTER — Ambulatory Visit: Payer: Self-pay | Admitting: Physician Assistant

## 2024-12-07 LAB — CBC
Hematocrit: 42 % (ref 37.5–51.0)
Hemoglobin: 13.5 g/dL (ref 13.0–17.7)
MCH: 31.3 pg (ref 26.6–33.0)
MCHC: 32.1 g/dL (ref 31.5–35.7)
MCV: 97 fL (ref 79–97)
Platelets: 162 x10E3/uL (ref 150–450)
RBC: 4.31 x10E6/uL (ref 4.14–5.80)
RDW: 12.3 % (ref 11.6–15.4)
WBC: 7.4 x10E3/uL (ref 3.4–10.8)

## 2024-12-07 LAB — LIPID PANEL
Chol/HDL Ratio: 1.8 ratio (ref 0.0–5.0)
Cholesterol, Total: 151 mg/dL (ref 100–199)
HDL: 86 mg/dL
LDL Chol Calc (NIH): 47 mg/dL (ref 0–99)
Triglycerides: 99 mg/dL (ref 0–149)
VLDL Cholesterol Cal: 18 mg/dL (ref 5–40)

## 2024-12-07 LAB — LDL CHOLESTEROL, DIRECT: LDL Direct: 46 mg/dL (ref 0–99)

## 2024-12-07 NOTE — Progress Notes (Signed)
 Called patient, left voicemail to cb regarding lab results

## 2024-12-07 NOTE — Telephone Encounter (Signed)
 Spoke with pt regarding lab results. Pt verbalized understanding. Pt stated he came in for repeat labs yesterday. Lab results are in Epic. Pt had no further questions.

## 2024-12-08 ENCOUNTER — Encounter: Payer: Self-pay | Admitting: Internal Medicine

## 2024-12-08 ENCOUNTER — Ambulatory Visit: Admitting: Internal Medicine

## 2024-12-08 VITALS — BP 146/76 | HR 68 | Temp 98.2°F | Ht 69.0 in | Wt 156.2 lb

## 2024-12-08 DIAGNOSIS — I679 Cerebrovascular disease, unspecified: Secondary | ICD-10-CM

## 2024-12-08 DIAGNOSIS — R42 Dizziness and giddiness: Secondary | ICD-10-CM | POA: Diagnosis not present

## 2024-12-08 DIAGNOSIS — I10 Essential (primary) hypertension: Secondary | ICD-10-CM | POA: Diagnosis not present

## 2024-12-08 NOTE — Progress Notes (Signed)
 "  Subjective:  Patient ID: Charles Robles, male    DOB: 06/21/1946  Age: 79 y.o. MRN: [id number]  CC: Hypertension (3 month follow up )   HPI Charles Robles presents for f/up -  Discussed the use of AI scribe software for clinical note transcription with the patient, who gave verbal consent to proceed.  History of Present Illness Charles Robles is a 79 year old male who presents with chest pain, shortness of breath, and vertigo.  He has been experiencing chest pain and shortness of breath, with occasional difficulty getting a deep breath. He is scheduled for an echocardiogram next month.  He has been experiencing vertigo for the past few months, with a precise onset of three weeks, four days, and two hours ago. No associated nausea, vomiting, numbness, weakness, or tingling. He reports a little trouble walking and feeling off balance. No slurred speech, hearing problems, or headaches.  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 He has an upcoming appointment with his eye doctor in May.  He reports no trouble sleeping, although he wakes up during the night to resupply his wood stove. He denies any weird sensations in his ears, such as pain or fullness. He lives in [place], [address], and has been there for about five years. He heats his house with a wood stove.     Outpatient Medications Prior to Visit  Medication Sig Dispense Refill   aspirin  EC 81 MG tablet Take 81 mg by mouth daily. Swallow whole.     clobetasol  ointment (TEMOVATE ) 0.05 % Apply 1 Application topically 2 (two) times daily. Apply to affected areas twice daily until clear. 60 g 1   cyanocobalamin  2000 MCG tablet Take 1 tablet (2,000 mcg total) by mouth daily. 90 tablet 0   ezetimibe  (ZETIA ) 10 MG tablet Take 1 tablet (10 mg total) by mouth daily. 90 tablet 3   fluticasone  (FLONASE ) 50 MCG/ACT  nasal spray SHAKE LIQUID AND USE 2 SPRAYS IN EACH NOSTRIL DAILY 48 g 0   hydrocortisone  2.5 % cream Apply topically 2 (two) times daily as needed (Rash). 30 g 11   rosuvastatin  (CRESTOR ) 20 MG tablet Take 1 tablet (20 mg total) by mouth daily. 90 tablet 0   triamcinolone  ointment (KENALOG ) 0.1 % Apply 1 Application topically 2 (two) times daily. Apply to affected area of left lower leg twice daily until clear. 80 g 2   amoxicillin  (AMOXIL ) 500 MG tablet Take 4 tablets (2,000 mg total) by mouth as directed. Take 4 tablets 1 hour prior to dental work, including cleanings. 8 tablet 1   No facility-administered medications prior to visit.    ROS Review of Systems  Constitutional: Negative.  Negative for appetite change, chills, diaphoresis, fatigue and fever.  HENT: Negative.    Respiratory: Negative.  Negative for cough, chest tightness, shortness of breath and wheezing.   Cardiovascular:  Negative for chest pain, palpitations and leg swelling.  Gastrointestinal:  Negative for abdominal pain, constipation, diarrhea, nausea and vomiting.  Genitourinary: Negative.  Negative for difficulty urinating.  Musculoskeletal: Negative.   Skin: Negative.   Neurological: Negative.  Negative for dizziness, weakness and light-headedness.  Hematological:  Negative for adenopathy. Does not bruise/bleed easily.  Psychiatric/Behavioral:  Positive for confusion and decreased concentration.     Objective:  BP (!) 146/76 (BP Location: Left Arm, Patient Position: Sitting, Cuff Size: Normal)   Pulse 68  Temp 98.2 F (36.8 C) (Oral)   Ht 5' 9 (1.753 m)   Wt 156 lb 3.2 oz (70.9 kg)   SpO2 98%   BMI 23.07 kg/m   BP Readings from Last 3 Encounters:  12/08/24 (!) 146/76  12/06/24 132/70  10/25/24 (!) 179/90    Wt Readings from Last 3 Encounters:  12/08/24 156 lb 3.2 oz (70.9 kg)  12/06/24 153 lb 3.2 oz (69.5 kg)  09/07/24 153 lb 6.4 oz (69.6 kg)    Physical Exam Vitals reviewed.  Constitutional:       Appearance: Normal appearance. He is not ill-appearing.  HENT:     Right Ear: Hearing, tympanic membrane and external ear normal. There is impacted cerumen.     Left Ear: Hearing, tympanic membrane and external ear normal. There is impacted cerumen.     Nose: Nose normal.     Mouth/Throat:     Mouth: Mucous membranes are moist.  Eyes:     General: No scleral icterus.    Pupils: Pupils are equal, round, and reactive to light.  Cardiovascular:     Rate and Rhythm: Normal rate and regular rhythm.     Heart sounds: Murmur heard.     Systolic murmur is present with a grade of 1/6.     No diastolic murmur is present.     No friction rub. No gallop.     Comments: 1/6 SEM RUSB Pulmonary:     Effort: Pulmonary effort is normal.     Breath sounds: No stridor. No wheezing, rhonchi or rales.  Abdominal:     General: Abdomen is flat.     Palpations: There is no mass.     Tenderness: There is no abdominal tenderness. There is no guarding.     Hernia: No hernia is present.  Musculoskeletal:        General: Normal range of motion.     Cervical back: Neck supple.     Right lower leg: No edema.     Left lower leg: No edema.  Lymphadenopathy:     Cervical: No cervical adenopathy.  Skin:    General: Skin is warm and dry.     Coloration: Skin is not pale.     Findings: No bruising or rash.  Neurological:     Mental Status: He is alert.     Cranial Nerves: Cranial nerves 2-12 are intact. No cranial nerve deficit.     Sensory: Sensation is intact. No sensory deficit.     Motor: Motor function is intact. No weakness.     Coordination: Coordination is intact. Coordination normal.     Gait: Gait is intact. Gait normal.     Deep Tendon Reflexes: Reflexes abnormal. Babinski sign absent on the right side. Babinski sign absent on the left side.     Reflex Scores:      Tricep reflexes are 1+ on the right side and 1+ on the left side.      Bicep reflexes are 1+ on the right side and 1+ on the left  side.      Brachioradialis reflexes are 1+ on the right side and 0 on the left side.      Patellar reflexes are 2+ on the right side and 2+ on the left side.      Achilles reflexes are 1+ on the right side and 1+ on the left side. Psychiatric:        Attention and Perception: He is inattentive.  Mood and Affect: Mood normal.        Speech: Speech is delayed and tangential.        Behavior: Behavior normal.        Thought Content: Thought content normal.        Cognition and Memory: Cognition normal.     Lab Results  Component Value Date   WBC 7.4 12/06/2024   HGB 13.5 12/06/2024   HCT 42.0 12/06/2024   PLT 162 12/06/2024   GLUCOSE 99 05/18/2024   CHOL 151 12/06/2024   TRIG 99 12/06/2024   HDL 86 12/06/2024   LDLDIRECT 46 12/06/2024   LDLCALC 47 12/06/2024   ALT 42 05/18/2024   AST 35 05/18/2024   NA 138 05/18/2024   K 4.0 05/18/2024   CL 105 05/18/2024   CREATININE 1.06 05/18/2024   BUN 18 05/18/2024   CO2 26 05/18/2024   TSH 0.92 05/18/2024   PSA 0.96 09/17/2021   INR 1.0 07/08/2019   HGBA1C 5.6 11/11/2022    CT Chest Wo Contrast Result Date: 10/04/2021 CLINICAL DATA:  History of pulmonary nodule, melanoma, aortic valve replacement EXAM: CT CHEST WITHOUT CONTRAST TECHNIQUE: Multidetector CT imaging of the chest was performed following the standard protocol without IV contrast. COMPARISON:  07/07/2020 FINDINGS: Cardiovascular: Limited without contrast. Thoracic aortic atherosclerosis and native coronary atherosclerosis scattered calcifications. Thoracic aorta normal in caliber. Negative for aneurysm. No mediastinal hemorrhage or hematoma. Previous TAVR noted. Normal heart size. Mediastinum/Nodes: No enlarged mediastinal or axillary lymph nodes. Thyroid  gland, trachea, and esophagus demonstrate no significant findings. Lungs/Pleura: Stable bibasilar subpleural scarring. Stable 3 mm calcified granuloma in the right lung base. No acute airspace process, collapse or  consolidation. No new or enlarging pulmonary nodule. Trachea and central airways are calcified but patent. No pleural abnormality, effusion, or pneumothorax. Upper Abdomen: Small hiatal hernia. Abdominal aortic atherosclerosis. No acute upper abdominal finding. Splenic calcified granuloma. Musculoskeletal: Degenerative changes of the spine. No acute osseous finding IMPRESSION: Stable bibasilar subpleural scarring, mild. Stable 3 mm calcified granuloma right lower lobe. Remote TAVR. Native thoracic aortic and coronary atherosclerosis. No suspicious or enlarging pulmonary nodule. Aortic Atherosclerosis (ICD10-I70.0). Electronically Signed   By: CHRISTELLA.  Shick M.D.   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 He prefers not to undergo an MRI.  Primary hypertension- BP is adequately well controlled.     Follow-up: No follow-ups on file.  Debby Molt, MD "

## 2024-12-09 NOTE — Progress Notes (Signed)
"   Order(s) created erroneously. Erroneous order ID: [id number]  Order moved by: CHART CORRECTION ANALYST, SEVENTEEN  Order move date/time: 12/09/2024 9:06 AM  Source Patient: [id number]  Source Contact: 12/06/2024  Destination Patient: [id number]  Destination Contact: 04/29/2023  Erroneous order ID: [id number]  Order moved by: CHART CORRECTION ANALYST, SEVENTEEN  Order move date/time: 12/09/2024 9:06 AM  Source Patient: [id number]  Source Contact: 12/06/2024  Destination Patient: [id number]  Destination Contact: 04/29/2023 "

## 2024-12-22 ENCOUNTER — Other Ambulatory Visit: Payer: Self-pay | Admitting: Internal Medicine

## 2024-12-22 DIAGNOSIS — J301 Allergic rhinitis due to pollen: Secondary | ICD-10-CM

## 2025-01-05 ENCOUNTER — Ambulatory Visit (HOSPITAL_COMMUNITY)

## 2025-01-23 ENCOUNTER — Ambulatory Visit: Admitting: Physician Assistant

## 2025-09-13 ENCOUNTER — Encounter: Admitting: Internal Medicine

## 2025-09-13 ENCOUNTER — Ambulatory Visit
